# Patient Record
Sex: Male | Born: 1945
Health system: Southern US, Community
[De-identification: ages and names within clinical notes are randomized; demographics above are authoritative.]

## PROBLEM LIST (undated history)

## (undated) DIAGNOSIS — I2510 Atherosclerotic heart disease of native coronary artery without angina pectoris: Secondary | ICD-10-CM

## (undated) DIAGNOSIS — E291 Testicular hypofunction: Secondary | ICD-10-CM

## (undated) DIAGNOSIS — M869 Osteomyelitis, unspecified: Secondary | ICD-10-CM

## (undated) DIAGNOSIS — M1990 Unspecified osteoarthritis, unspecified site: Secondary | ICD-10-CM

## (undated) DIAGNOSIS — E785 Hyperlipidemia, unspecified: Secondary | ICD-10-CM

## (undated) DIAGNOSIS — I10 Essential (primary) hypertension: Secondary | ICD-10-CM

## (undated) DIAGNOSIS — Z9289 Personal history of other medical treatment: Secondary | ICD-10-CM

## (undated) HISTORY — DX: Osteomyelitis, unspecified: M86.9

## (undated) HISTORY — DX: Testicular hypofunction: E29.1

## (undated) HISTORY — DX: Atherosclerotic heart disease of native coronary artery without angina pectoris: I25.10

## (undated) HISTORY — DX: Unspecified osteoarthritis, unspecified site: M19.90

## (undated) HISTORY — DX: Essential (primary) hypertension: I10

## (undated) HISTORY — DX: Personal history of other medical treatment: Z92.89

## (undated) HISTORY — DX: Hyperlipidemia, unspecified: E78.5

---

## 2004-08-01 ENCOUNTER — Encounter: Admission: RE | Admit: 2004-08-01 | Discharge: 2004-08-01 | Payer: Self-pay | Admitting: Family Medicine

## 2004-08-19 ENCOUNTER — Encounter: Admission: RE | Admit: 2004-08-19 | Discharge: 2004-08-19 | Payer: Self-pay | Admitting: Orthopedic Surgery

## 2004-09-08 ENCOUNTER — Ambulatory Visit: Payer: Self-pay | Admitting: Infectious Diseases

## 2004-10-19 HISTORY — PX: CORONARY ARTERY BYPASS GRAFT: SHX141

## 2004-10-28 ENCOUNTER — Ambulatory Visit (HOSPITAL_COMMUNITY): Admission: RE | Admit: 2004-10-28 | Discharge: 2004-10-28 | Payer: Self-pay | Admitting: Cardiology

## 2004-11-24 ENCOUNTER — Inpatient Hospital Stay (HOSPITAL_COMMUNITY): Admission: RE | Admit: 2004-11-24 | Discharge: 2004-11-28 | Payer: Self-pay | Admitting: Surgery

## 2004-12-22 ENCOUNTER — Encounter (HOSPITAL_COMMUNITY): Admission: RE | Admit: 2004-12-22 | Discharge: 2005-01-20 | Payer: Self-pay | Admitting: Cardiology

## 2005-01-21 ENCOUNTER — Encounter (HOSPITAL_COMMUNITY): Admission: RE | Admit: 2005-01-21 | Discharge: 2005-04-21 | Payer: Self-pay | Admitting: Cardiology

## 2008-08-06 ENCOUNTER — Ambulatory Visit: Payer: Self-pay | Admitting: Internal Medicine

## 2008-08-06 DIAGNOSIS — S82409B Unspecified fracture of shaft of unspecified fibula, initial encounter for open fracture type I or II: Secondary | ICD-10-CM

## 2008-08-06 DIAGNOSIS — Z951 Presence of aortocoronary bypass graft: Secondary | ICD-10-CM

## 2008-08-06 DIAGNOSIS — S82209B Unspecified fracture of shaft of unspecified tibia, initial encounter for open fracture type I or II: Secondary | ICD-10-CM | POA: Insufficient documentation

## 2008-08-06 DIAGNOSIS — I2510 Atherosclerotic heart disease of native coronary artery without angina pectoris: Secondary | ICD-10-CM | POA: Insufficient documentation

## 2008-08-06 DIAGNOSIS — E785 Hyperlipidemia, unspecified: Secondary | ICD-10-CM | POA: Insufficient documentation

## 2008-08-06 DIAGNOSIS — Z9189 Other specified personal risk factors, not elsewhere classified: Secondary | ICD-10-CM | POA: Insufficient documentation

## 2008-08-06 DIAGNOSIS — I10 Essential (primary) hypertension: Secondary | ICD-10-CM

## 2008-08-06 DIAGNOSIS — M86669 Other chronic osteomyelitis, unspecified tibia and fibula: Secondary | ICD-10-CM

## 2008-09-17 ENCOUNTER — Encounter: Payer: Self-pay | Admitting: Internal Medicine

## 2008-09-28 ENCOUNTER — Encounter: Admission: RE | Admit: 2008-09-28 | Discharge: 2008-09-28 | Payer: Self-pay | Admitting: Orthopedic Surgery

## 2008-09-28 ENCOUNTER — Encounter: Payer: Self-pay | Admitting: Internal Medicine

## 2008-12-07 ENCOUNTER — Encounter: Payer: Self-pay | Admitting: Internal Medicine

## 2008-12-25 ENCOUNTER — Ambulatory Visit: Payer: Self-pay | Admitting: Internal Medicine

## 2008-12-28 ENCOUNTER — Telehealth: Payer: Self-pay | Admitting: Internal Medicine

## 2009-01-03 ENCOUNTER — Encounter (INDEPENDENT_AMBULATORY_CARE_PROVIDER_SITE_OTHER): Payer: Self-pay | Admitting: Interventional Radiology

## 2009-01-03 ENCOUNTER — Ambulatory Visit (HOSPITAL_COMMUNITY): Admission: RE | Admit: 2009-01-03 | Discharge: 2009-01-03 | Payer: Self-pay | Admitting: Internal Medicine

## 2009-01-18 ENCOUNTER — Ambulatory Visit (HOSPITAL_COMMUNITY): Admission: RE | Admit: 2009-01-18 | Discharge: 2009-01-18 | Payer: Self-pay | Admitting: Internal Medicine

## 2009-01-23 ENCOUNTER — Telehealth: Payer: Self-pay | Admitting: Internal Medicine

## 2009-03-28 ENCOUNTER — Ambulatory Visit: Payer: Self-pay | Admitting: Internal Medicine

## 2009-05-28 ENCOUNTER — Ambulatory Visit: Payer: Self-pay | Admitting: Internal Medicine

## 2009-10-16 ENCOUNTER — Emergency Department (HOSPITAL_COMMUNITY): Admission: EM | Admit: 2009-10-16 | Discharge: 2009-10-17 | Payer: Self-pay | Admitting: Emergency Medicine

## 2011-01-12 ENCOUNTER — Other Ambulatory Visit: Payer: Self-pay | Admitting: Gastroenterology

## 2011-01-19 LAB — BASIC METABOLIC PANEL
BUN: 13 mg/dL (ref 6–23)
CO2: 25 mEq/L (ref 19–32)
Calcium: 9.1 mg/dL (ref 8.4–10.5)
Chloride: 99 mEq/L (ref 96–112)
Creatinine, Ser: 0.92 mg/dL (ref 0.4–1.5)
GFR calc Af Amer: >60 mL/min (ref >60)
GFR calc non Af Amer: >60 mL/min (ref >60)
Glucose, Bld: 118 mg/dL — ABNORMAL HIGH (ref 70–99)
Potassium: 3.8 mEq/L (ref 3.5–5.1)
Sodium: 134 mEq/L — ABNORMAL LOW (ref 135–145)

## 2011-01-19 LAB — DIFFERENTIAL
Basophils Absolute: 0.1 10*3/uL (ref 0.0–0.1)
Basophils Relative: 0 % (ref 0–1)
Eosinophils Absolute: 0.1 10*3/uL (ref 0.0–0.7)
Eosinophils Relative: 1 % (ref 0–5)
Lymphocytes Relative: 6 % — ABNORMAL LOW (ref 12–46)
Lymphs Abs: 1.1 10*3/uL (ref 0.7–4.0)
Monocytes Absolute: 0.5 10*3/uL (ref 0.1–1.0)
Monocytes Relative: 3 % (ref 3–12)
Neutro Abs: 17.1 10*3/uL — ABNORMAL HIGH (ref 1.7–7.7)
Neutrophils Relative %: 90 % — ABNORMAL HIGH (ref 43–77)

## 2011-01-19 LAB — URINALYSIS, ROUTINE W REFLEX MICROSCOPIC
Bilirubin Urine: NEGATIVE
Glucose, UA: NEGATIVE mg/dL
Hgb urine dipstick: NEGATIVE
Ketones, ur: 15 mg/dL — AB
Nitrite: NEGATIVE
Protein, ur: NEGATIVE mg/dL
Specific Gravity, Urine: 1.017 (ref 1.005–1.030)
Urobilinogen, UA: 0.2 mg/dL (ref 0.0–1.0)
pH: 5.5 (ref 5.0–8.0)

## 2011-01-19 LAB — CBC
HCT: 44 % (ref 39.0–52.0)
Hemoglobin: 15.2 g/dL (ref 13.0–17.0)
MCHC: 34.5 g/dL (ref 30.0–36.0)
MCV: 94.7 fL (ref 78.0–100.0)
Platelets: 220 10*3/uL (ref 150–400)
RBC: 4.65 MIL/uL (ref 4.22–5.81)
RDW: 13.1 % (ref 11.5–15.5)
WBC: 19 10*3/uL — ABNORMAL HIGH (ref 4.0–10.5)

## 2011-01-19 LAB — POCT CARDIAC MARKERS
CKMB, poc: 1.6 ng/mL (ref 1.0–8.0)
Myoglobin, poc: 115 ng/mL (ref 12–200)
Troponin i, poc: <0.05 ng/mL (ref 0.00–0.09)

## 2011-01-19 LAB — PROTIME-INR
INR: 0.98 (ref 0.00–1.49)
Prothrombin Time: 12.9 seconds (ref 11.6–15.2)

## 2011-01-19 LAB — APTT: aPTT: 29 seconds (ref 24–37)

## 2011-01-28 LAB — AFB CULTURE WITH SMEAR (NOT AT ARMC): Acid Fast Smear: NONE SEEN

## 2011-01-28 LAB — ANAEROBIC CULTURE

## 2011-01-28 LAB — CULTURE, ROUTINE-ABSCESS: Culture: NO GROWTH

## 2011-01-29 LAB — CBC
HCT: 43.4 % (ref 39.0–52.0)
Hemoglobin: 15.4 g/dL (ref 13.0–17.0)
MCHC: 35.6 g/dL (ref 30.0–36.0)
MCV: 93.6 fL (ref 78.0–100.0)
Platelets: 251 10*3/uL (ref 150–400)
RBC: 4.63 MIL/uL (ref 4.22–5.81)
RDW: 12.6 % (ref 11.5–15.5)
WBC: 8.7 10*3/uL (ref 4.0–10.5)

## 2011-01-29 LAB — PROTIME-INR
INR: 0.9 (ref 0.00–1.49)
Prothrombin Time: 12.7 seconds (ref 11.6–15.2)

## 2011-03-06 NOTE — Op Note (Signed)
NAMEMarland Kitchen  Craig Knapp, Craig Knapp              ACCOUNT NO.:  [id number]   MEDICAL RECORD NO.:  [id number]          PATIENT TYPE:  INP   LOCATION:  2307                         FACILITY:  [facility]   PHYSICIAN:  Evelene Croon, M.D.     DATE OF BIRTH:  04-04-1946   DATE OF PROCEDURE:  11/24/2004  DATE OF DISCHARGE:                                 OPERATIVE REPORT   PREOPERATIVE DIAGNOSIS:  Severe three vessel coronary artery disease with  unstable angina.   POSTOPERATIVE DIAGNOSIS:  Severe three vessel coronary artery disease with  unstable angina.   PROCEDURE:  Median sternotomy, extracorporeal circulation, coronary artery  bypass graft surgery x6 using a left internal mammary artery graft to the  left anterior descending coronary artery, with a saphenous vein graft to the  second diagonal branch of the LAD, a sequential saphenous vein graft to the  first diagonal, intermediate, and obtuse marginal coronary arteries, and a  saphenous vein graft to the right coronary artery.  Endoscopic vein  harvesting from the left leg.   SURGEON:  Evelene Croon, M.D.   ASSISTANT:  Kerin Perna, M.D.   SECOND ASSISTANT:  Toribio Harbour, N.P.   ANESTHESIA:  General endotracheal.   INDICATIONS FOR PROCEDURE:  This patient is a 65 year old gentleman with a  family history of coronary artery disease, a history of hyperlipidemia,  hypertension, and smoking, who presented with a six-month history of  increasing dyspnea on exertion and diaphoresis followed by chest tightness.  A Cardiolite scan on October 14, 2004, showed moderate to severe perfusion  defect in the inferior and inferolateral walls with some reversibility.  Left ventricular ejection fraction was 65%.  He subsequently underwent  cardiac catheterization.  The LAD had a long proximal 75 to 80% stenosis.  There was a large bifurcating diagonal that had about 75% stenosis at its  origin as well as in the more inferior subbranch.  There was a  moderate size  intermediate branch that bifurcated proximally.  Both subbranches appeared  to be involved with about 80% stenosis.  The left circumflex artery itself  had a couple of small left ventricular branches which did not appear large  enough to graft.  There was no significant obstruction in the left  circumflex itself.  The right coronary artery was a nondominant, but large  artery.  There was a single large right ventricular branch that had about  85% focal stenosis.  There was no gradient across the aortic valve and no  mitral regurgitation.  Ejection fraction was 65 to 70% with akinesis of the  inferior wall.  After review of the angiogram and examination of the  patient, it was felt that coronary artery bypass graft surgery was the best  treatment.  I discussed the operative procedure with the patient and his  family including alternatives, benefits, and risks including bleeding, blood  transfusion, infection, stroke, myocardial infarction, graft failure, and  death.  They understood and agreed to proceed.   DESCRIPTION OF PROCEDURE:  The patient was taken to the operating room and  placed on the table in the supine position.  After induction of general  endotracheal anesthesia, a Foley catheter was placed in the bladder using  sterile technique.  Then the chest, abdomen, and both lower extremities were  prepped and draped in the usual sterile fashion.  The right lower leg was  draped out of the operative field due to a history of osteomyelitis with an  open draining wound there.   The chest was entered through a median sternotomy incision and the  pericardium opened in the midline.  Examination of the heart showed good  ventricular contractility.  The ascending aorta had no palpable plaques in  it.   Then the left internal mammary artery was harvested from the chest wall as a  pedicle graft.  This was a large caliber vessel with excellent blood flow  through it.  At the  same time a segment of greater saphenous vein was  harvested from the left leg using endoscopic vein harvest technique.  This  vein was of medium size and good quality.   Then the patient was heparinized and when an adequate activated clotting  time was achieved, the distal ascending aorta was cannulated using a 22  French aortic cannula for arterial inflow.  Venous outflow was achieved  using a two-stage venous cannula through the right atrial appendage.  An  antegrade cardioplegia and vent cannula was inserted into the aortic root.   The patient was placed on cardiopulmonary bypass and the distal coronary  artery was identified.  The LAD was a large graftable vessel with no  significant distal disease in it.  The diagonal branch bifurcated into two  subbranches that I designated first and second diagonal branches.  The first  was more lateral and was a small, but graftable vessel.  The second was a  medium size graftable vessel.  The intermediate vessel was small, but  graftable.  The larger subbranch of the intermediate or obtuse marginal  artery was a large graftable vessel.  The distal left circumflex was never  visualized and there were several very tiny posterolateral branches that  were not graftable.  The right coronary artery gave off a single acute  marginal branch that was a moderate size vessel.   Then the aorta was crossclamped and 500 mL of cold blood antegrade  cardioplegia was administered into the aortic root with quick arrest of the  heart.  Systemic hypothermia to 28 degrees cGy and topical hypothermia with  iced saline was used.  A temperature probe was placed in the septum and  insulating pad in the pericardium.   The first distal anastomosis was performed to the intermediate coronary  artery. The internal diameter was about 1.5 mm.  The conduit used was a  segment of greater saphenous vein and the anastomosis performed in a sequential side-to-side manner using  continuous 8-0 Prolene suture.  Flow  was measured through the graft and was excellent.   The second distal anastomosis was performed to the first diagonal branch.  The internal diameter of this vessel was about 1.5 mm.  The conduit used was  the same segment of greater saphenous vein and the anastomosis performed in  a sequential side-to-side manner using continuous 8-0 Prolene suture.  Flow  was measured through the graft and was excellent.   A third distal anastomosis was performed to the obtuse marginal branch.  The  internal diameter of this vessel was about 2 mm.  The conduit used was the  same segment of greater saphenous vein and the anastomosis  performed in a  sequential end-to-side manner using continuous 7-0 Prolene suture.  Flow was  measured through the graft and was excellent.  Then a dose of cardioplegia  was given down the vein graft and into the aortic root.   The fourth distal anastomosis was performed to the second diagonal branch.  The internal diameter was about 1.6 mm.  The conduit used was a second  segment of greater saphenous vein.  The anastomosis performed in an end-to-  side manner using continuous 7-0 Prolene suture.  Flow was measured through  the graft and was excellent.   The fifth distal anastomosis was performed to the acute marginal branch.  The internal diameter was 1.6 mm.  The conduit used was a third segment of  greater saphenous vein.  The anastomosis performed in an end-to-side manner  using continuous 7-0 Prolene suture.  Flow was measured through the graft  and was excellent.  Then another dose of cardioplegia was given down the  vein grafts and into the aortic root.   The sixth distal anastomosis was performed to the midportion of the left  anterior descending coronary artery.  The internal diameter of this vessel  was about 2.5 mm.  The conduit used was the left internal mammary artery  graft and this was brought out through an opening in  the left pericardium  anterior to the phrenic nerve.  It was anastomosed to the LAD in an end-to-  side manner using continuous 8-0 Prolene suture.  The pedicle was sutured to  the epicardium using 6-0 Prolene sutures.  Then another dose of cardioplegia  was given.   Then with the crossclamp in place, the three proximal vein graft anastomoses  were performed to the aortic root in an end-to-side manner using continuous  6-0 Prolene suture.  The patient was rewarmed to 37 degrees cGy.  The clamp  was removed from the mammary artery pedicle.  There was rapid warming of the  ventricular septum and return of spontaneous ventricular fibrillation.  The  crossclamp was removed with a time of 88 minutes and the patient  spontaneously converted to sinus rhythm.   The proximal and distal anastomoses appeared hemostatic and the line of the  grafts satisfactory.  Graft markers were placed around the proximal anastomoses.  Two temporary right ventricular and right atrial pacing wires  were placed and brought out through the skin.   When the patient had rewarmed to 37 degrees cGy, he was weaned from  cardiopulmonary bypass on no inotropic agents.  Total bypass time was 106  minutes.  Cardiac function appeared excellent and the cardiac output of 6  liters per minute.  Protamine was given and the venous and aortic cannuli  were removed without difficulty.  Hemostasis was achieved.  Three chest  tubes were placed with two in the posterior pericardium, one in the left  pleural space, and one in the anterior mediastinum.  The pericardium was  loosely reapproximated over the heart.  The sternum was closed with #6  stainless steel wires.  Fascia was closed with a continuous #1 Vicryl  suture.  Subcutaneous tissue was closed with continuous 2-0 Vicryl and the  skin with 3-0 Vicryl subcuticular closure.  The lower extremity vein harvest  site was closed in layers in a similar manner.  Needle, sponge, and   instrument counts correct according to the scrub nurse.  Dry sterile  dressings were applied over the incisions, around the chest tubes which were  hooked  to Pleurovac suction.  The patient remained hemodynamically stable  and was transported to the SICU in guarded, but stable condition.      BB/MEDQ  D:  11/24/2004  T:  11/24/2004  Job:  [id number]   cc:   Francisca December, M.D.  [address]  [address]  [place]  [place] [address]  Fax: [phone]

## 2011-03-06 NOTE — Cardiovascular Report (Signed)
NAME:  Craig Knapp, Craig Knapp              ACCOUNT NO.:  [id number]   MEDICAL RECORD NO.:  [id number]          PATIENT TYPE:  OIB   LOCATION:  2852                         FACILITY:  [facility]   PHYSICIAN:  Francisca December, M.D.  DATE OF BIRTH:  [birth year]   DATE OF PROCEDURE:  DATE OF DISCHARGE:                              CARDIAC CATHETERIZATION   PROCEDURE:  1.  Left heart catheterization.  2.  Coronary angiography.  3.  Left ventriculogram.   CARDIOLOGIST:  Francisca December, M.D.   INDICATIONS FOR PROCEDURE:  The patient is a 65 year old man who presented  to Dr. Jethro Bastos and subsequently to myself with a fairly typical  history for angina pectoris in an NYHA class 2 pattern.  He underwent an  exercise myocardial perfusion study at my office, which showed a moderate to  severe perfusion defect in the inferior and inferolateral wall, which  revealed mild defect reversibility on rest imaging.  The ejection fraction  was 65%.  He was able to exercise only five METS, and angina was reproduced.  He is brought to the cardiac catheterization laboratory at this time to  identify the extent of disease and to provide for further therapeutic  options.   DESCRIPTION OF PROCEDURE:  The patient was brought to the cardiac  catheterization laboratory in the fasting state.  The right groin was  prepped and draped in the usual sterile fashion.  Local anesthesia was  obtained with the infiltration of 1% Lidocaine.  A 5-French catheter sheath  was inserted percutaneously into the right femoral artery, utilizing an  anterior approach over a guiding J-wire.  A 110 cm pigtail catheter was used  to measure pressures in the ascending aorta and in the left ventricle both  prior to, and following the ventriculogram.  A 30-degree RAO cine left  ventriculogram was performed utilizing a power injector, and 40 mL of  contrast material was injected at 13 mL per second.  Following the  sublingual  administration of 0.4 mg of nitroglycerin, a cine angiography of  each coronary artery was conducted in multiple LAO and RAO projections.  At  the completion of the procedure, the catheter and catheter sheaths were  removed.  Hemostasis was achieved by direct pressure.   The patient was transported to the recovery area in stable condition with an  intact distal pulse.   HEMODYNAMICS:  Systemic arterial pressure:  130/75, with a mean of 100 mmHg.  There was no systolic gradient across the aortic valve.  Left ventricular end diastolic pressure:  Was 13 mmHg pre-ventriculogram and  post-ventriculogram.   ANGIOGRAPHY:  The left ventriculogram demonstrated normal chamber size and  normal global systolic function with a visually-estimated ejection fraction  of 65%-70%.  There is focal akinesis of the diaphragmatic inferior wall.  The aortic valve was tri-leaflet and opens normally.  There is no  significant mitral regurgitation.  There is left coronary artery  calcification seen.   RESULTS:  1.  MAIN LEFT CORONARY ARTERY:  There is a left dominant coronary system      present.  The main left  coronary artery demonstrates a 30% eccentric      narrowing over approximately two-thirds the length of the vessel.  The      vessel then trifurcates into the anterior descending coronary artery.  A      large ramus intermedius branch, which immediately bifurcates itself, and      the left dominant circumflex.   1.  LEFT ANTERIOR DESCENDING CORONARY ARTERY:  The left anterior descending      coronary artery and its branches are highly diseased.  The vessel is      diffusely diseased throughout the proximal and mid-segment, to an extent      of about 75%-80%.  There is a large bifurcating diagonal branch, which      has a 75% stenosis at its origin, and is highly angulated.  The ongoing      anterior descending coronary artery reaches and traverses the apex, but      displays no significant  obstructions.   1.  RAMUS INTERMEDIUS BRANCH:  The ramus intermedius branch is large, and as      mentions bifurcates within 1 cm of its origin.  The more lateral sub-      branches the dominant vessel and is the vessel that perfuses the area      with wall motion abnormality.  The more medial sub-branch is relatively      small in caliber.   1.  LEFT CIRCUMFLEX CORONARY ARTERY:  The left circumflex coronary artery      demonstrates no significant obstructions that I can determine.  It gives      rise to two small left ventricular branches and a small to moderate-size      posterolateral segment.  There is a small posterior descending artery      which does not leave the more proximal one-third of the inferior      ventricular septum.  None of these vessels are adequate for graft      insertion.   1.  RIGHT CORONARY ARTERY:  The right coronary artery is non-dominant but      relatively large.  It amounts to a right ventricular branch, but does      give some distal septal perforators into the inferior septum near the      apex.  In the mid-portion of this vessel, there is a focal 85% stenosis.      There are no collateral vessels seen.   FINAL IMPRESSION:  1.  Atherosclerotic coronary vascular disease, three-vessel equivalent.  2.  Intact global left ventricular size and systolic function, with focal      regional wall motion abnormality as noted.  3.  New York Heart Association class 2 angina pectoris.   PLAN/RECOMMENDATIONS:  I am referring the patient to the cardiovascular and  thoracic surgeons for consideration of coronary artery bypass graft surgery.  The distal vessels are all adequate in size, including the right ventricular  branch for bypass; however, this may be complicated by the recurrence of an  old injury-related osteomyelitis in the distal right tibia-fibula region.  The patient has seen an infectious disease specialist recently, and is scheduled to see an orthopedic  surgeon in the near future.  He is not  currently being treated with any antibiotics.   Intervention from a percutaneous standpoint would be quite difficulty, given  the ostial nature of the large ramus intermedius lesion.  The anterior  descending artery could be treated with extensive stenting to the  mid and  proximal portion.      John   JHE/MEDQ  D:  10/28/2004  T:  10/28/2004  Job:  [id number]   cc:   Jethro Bastos, M.D.  [address]  [place]  [place] [address]  Fax: [phone]

## 2011-03-06 NOTE — Discharge Summary (Signed)
NAME:  Craig Knapp, Craig Knapp              ACCOUNT NO.:  [id number]   MEDICAL RECORD NO.:  [id number]          PATIENT TYPE:  INP   LOCATION:  2014                         FACILITY:  [facility]   PHYSICIAN:  Evelene Croon, M.D.     DATE OF BIRTH:  [birth year]   DATE OF ADMISSION:  11/24/2004  DATE OF DISCHARGE:  11/28/2004                                 DISCHARGE SUMMARY   ADMITTING DIAGNOSES:  Severe three vessel coronary artery disease.   PAST MEDICAL HISTORY:  1.  Hypertension.  2.  Hyperlipidemia.  3.  Ongoing tobacco use.  4.  Chronic osteomyelitis of right lower tibia, long-standing problem      greater than 20 years.   DISCHARGE DIAGNOSES:  Severe three vessel coronary artery disease status  post coronary artery bypass graft.   BRIEF HISTORY:  Craig Knapp is a 65 year old Caucasian man.  He was evaluated  by Dr. Corliss Marcus and this was performed on October 28, 2004.  Cardiac  catheterization revealed severe three vessel coronary artery disease.  As  his lesions were not amenable to PCI, cardiac surgery consultation was  requested.  Craig Knapp was evaluated by Dr. Evelene Croon at the [facility] office  on November 11, 2004.  After examination of the patient and review of all  available records Dr. Laneta Simmers agreed that proceeding with coronary artery  bypass grafting was the preferred treatment choice for this gentleman.  The  procedure, risks, and benefits of coronary artery bypass grafting were all  discussed with Craig Knapp and he agreed to proceed with surgery.   HOSPITAL COURSE:  On November 24, 2004 Craig Knapp was electively admitted to  [facility] under the care of Dr. Evelene Croon.  He underwent the  following surgical procedure:  Coronary artery bypass grafting x6.  Grafts  placed at time of procedure:  Left internal mammary artery graft to the left  anterior descending artery, saphenous vein graft to the second diagonal  artery, saphenous vein graft in the sequential fashion  to the first  diagonal, first obtuse marginal, and second obtuse marginal arteries,  saphenous vein graft to the distal right coronary artery.  Vein was  harvested from Craig Knapp left thigh through the mid calf with an Endo  vein harvesting technique.  He tolerated this procedure well transferring in  stable condition to the SICU.  He remained hemodynamically stable in the  immediate postoperative period.  He was extubated several hours after  arrival in intensive care unit and awoke from anesthesia neurologically  intact.  He required only routine care in the intensive care unit and was  ready for transfer to unit 2000 on postoperative day one.   Craig Knapp is making very good progress in recovering from his surgery.  This morning, February 9 is postoperative day three.  On morning rounds Mr.  Knapp reports feeling very well.  His vital signs are stable with blood  pressure 137/81.  He is afebrile.  Room air saturation is 94%.  His heart is  maintaining a normal sinus rhythm, 93 beats per minute.  His lungs are  clear  to auscultation.  He is tolerating his diet.  Bowel and bladder habits  within normal limits for him.  His incisions are healing well.  He has no  lower extremity edema.  He is ambulating independently.  His pain is well  controlled.  His weight is still slightly above his preoperative weight and  he will continue his diuretic medication for the next 24-48 hours.  If Mr.  Knapp remains stable it is anticipated he will be ready for discharge home  tomorrow, November 28, 2004.   LABORATORIES:  February 8 CBC:  White blood cells 12.5, hemoglobin 10.8,  hematocrit 31.0, platelets 213.  Chemistries include sodium 136, potassium  3.7, BUN 6, creatinine 0.9, glucose 118.  Please note that BMP will be  repeated morning of February 10.   CONDITION ON DISCHARGE:  Improved.   DISCHARGE INSTRUCTIONS:   MEDICATIONS:  1.  Enteric-coated aspirin 325 mg daily.  2.  Lopressor 25  mg b.i.d.  3.  Vytorin 10/20 mg daily.   PAIN MANAGEMENT:  For pain management he may have Tylox one to two p.o.  q.4h. p.r.n. for severe pain or Tylenol 325 mg one to two p.o. q.4h. for  mild pain.   ACTIVITY:  He has been asked to refrain from any driving or heavy lifting,  pushing, or pulling.  Also instructed to continue his breathing exercises  and daily walking.  He has been counseled regarding smoking cessation.  He  reports that he plans to stay stopped.  He is also asked about chewing  tobacco and has been advised not to take up this habit.   DIET:  Continue to be low fat, low salt diet.   WOUND CARE:  He is to shower daily with mild soap and water.  If his  incisions show any sign of infection he is to call Dr. Sharee Pimple office.   FOLLOW-UP:  Dr. Amil Amen is to see him back in his office in approximately  two weeks and patient has been asked to call to arrange this appointment and  given the office phone number.  He will have a chest x-ray taken that day.  Dr. Laneta Simmers would like to see him back at the [facility] office on Tuesday,  February 28 at 11:45 a.m.  He will be asked to bring his chest x-ray from  Dr. Amil Amen' office for Dr. Laneta Simmers to review.      CTK/MEDQ  D:  11/27/2004  T:  11/27/2004  Job:  [id number]   cc:   Vania Rea. Supple, M.D.  [facility]  [address]  [address]  [place]  [place] [address]  Fax: [phone]   Jethro Bastos, M.D.  [address]  [place]  [place] [address]  Fax: [phone]

## 2011-03-27 DIAGNOSIS — E291 Testicular hypofunction: Secondary | ICD-10-CM | POA: Insufficient documentation

## 2011-04-13 DIAGNOSIS — E291 Testicular hypofunction: Secondary | ICD-10-CM

## 2011-04-14 ENCOUNTER — Ambulatory Visit (INDEPENDENT_AMBULATORY_CARE_PROVIDER_SITE_OTHER): Payer: Self-pay | Admitting: Internal Medicine

## 2011-04-14 ENCOUNTER — Encounter: Payer: Self-pay | Admitting: Internal Medicine

## 2011-04-14 VITALS — BP 150/102 | HR 69 | Temp 98.0°F | Ht 69.0 in | Wt 234.5 lb

## 2011-04-14 DIAGNOSIS — M86669 Other chronic osteomyelitis, unspecified tibia and fibula: Secondary | ICD-10-CM
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
## 2011-04-14 NOTE — Assessment & Plan Note (Addendum)
He is aware that his very chronic osteomyelitis would be extremely difficult to cure her without amputation. While he is willing to consider that surgical procedure he is not ready to make that decision yet. I suggested that he reevaluate his goal and make sure that it is a reasonable one before deciding about what he might do to achieve that goal. I certainly think it would be reasonable for him to get yet another opinion at [facility]. I will try to facilitate that. I think the next step beyond that might be to repeat an aspirate and try to define a specific organism causing the osteomyelitis. Once that has been established it would be easier to determine if there might be a simple and relatively safe antibiotic regimen that he could take in an attempt to chronically suppress the infection. Having a definitive organism would also be extremely important if he ever makes a decision to try to have surgery to achieve limb salvage and attempt to cure the osteomyelitis.

## 2011-05-15 ENCOUNTER — Telehealth: Payer: Self-pay | Admitting: *Deleted

## 2011-05-15 NOTE — Telephone Encounter (Signed)
Pt confirmed that he has an appt @ [facility] for May 28, 2011.  He stated that records from that visit will be shared w/ Dr. Orvan Falconer.  Jennet Maduro, RN.

## 2012-09-26 DIAGNOSIS — E782 Mixed hyperlipidemia: Secondary | ICD-10-CM | POA: Diagnosis not present

## 2012-10-28 DIAGNOSIS — Z0000 Encounter for general adult medical examination without abnormal findings: Secondary | ICD-10-CM | POA: Diagnosis not present

## 2012-10-28 DIAGNOSIS — I10 Essential (primary) hypertension: Secondary | ICD-10-CM | POA: Diagnosis not present

## 2012-10-28 DIAGNOSIS — Z23 Encounter for immunization: Secondary | ICD-10-CM | POA: Diagnosis not present

## 2012-10-28 DIAGNOSIS — Z125 Encounter for screening for malignant neoplasm of prostate: Secondary | ICD-10-CM | POA: Diagnosis not present

## 2012-10-28 DIAGNOSIS — Z1331 Encounter for screening for depression: Secondary | ICD-10-CM | POA: Diagnosis not present

## 2012-12-22 DIAGNOSIS — I10 Essential (primary) hypertension: Secondary | ICD-10-CM | POA: Diagnosis not present

## 2012-12-22 DIAGNOSIS — Z0000 Encounter for general adult medical examination without abnormal findings: Secondary | ICD-10-CM | POA: Diagnosis not present

## 2012-12-22 DIAGNOSIS — Z1331 Encounter for screening for depression: Secondary | ICD-10-CM | POA: Diagnosis not present

## 2012-12-22 DIAGNOSIS — E785 Hyperlipidemia, unspecified: Secondary | ICD-10-CM | POA: Diagnosis not present

## 2012-12-22 DIAGNOSIS — Z79899 Other long term (current) drug therapy: Secondary | ICD-10-CM | POA: Diagnosis not present

## 2013-06-26 DIAGNOSIS — E785 Hyperlipidemia, unspecified: Secondary | ICD-10-CM | POA: Diagnosis not present

## 2013-06-26 DIAGNOSIS — Z79899 Other long term (current) drug therapy: Secondary | ICD-10-CM | POA: Diagnosis not present

## 2013-06-28 DIAGNOSIS — I10 Essential (primary) hypertension: Secondary | ICD-10-CM | POA: Diagnosis not present

## 2013-06-28 DIAGNOSIS — E785 Hyperlipidemia, unspecified: Secondary | ICD-10-CM | POA: Diagnosis not present

## 2013-06-28 DIAGNOSIS — I2510 Atherosclerotic heart disease of native coronary artery without angina pectoris: Secondary | ICD-10-CM | POA: Diagnosis not present

## 2013-06-28 DIAGNOSIS — R0602 Shortness of breath: Secondary | ICD-10-CM | POA: Diagnosis not present

## 2013-07-04 ENCOUNTER — Other Ambulatory Visit: Payer: Self-pay | Admitting: Interventional Cardiology

## 2013-07-04 DIAGNOSIS — Z79899 Other long term (current) drug therapy: Secondary | ICD-10-CM

## 2013-07-04 DIAGNOSIS — E785 Hyperlipidemia, unspecified: Secondary | ICD-10-CM

## 2013-07-08 DIAGNOSIS — Z23 Encounter for immunization: Secondary | ICD-10-CM | POA: Diagnosis not present

## 2013-07-17 DIAGNOSIS — L989 Disorder of the skin and subcutaneous tissue, unspecified: Secondary | ICD-10-CM | POA: Diagnosis not present

## 2013-07-28 DIAGNOSIS — H40019 Open angle with borderline findings, low risk, unspecified eye: Secondary | ICD-10-CM | POA: Diagnosis not present

## 2013-08-02 DIAGNOSIS — D485 Neoplasm of uncertain behavior of skin: Secondary | ICD-10-CM | POA: Diagnosis not present

## 2013-08-02 DIAGNOSIS — C44320 Squamous cell carcinoma of skin of unspecified parts of face: Secondary | ICD-10-CM | POA: Diagnosis not present

## 2013-08-18 DIAGNOSIS — H40019 Open angle with borderline findings, low risk, unspecified eye: Secondary | ICD-10-CM | POA: Diagnosis not present

## 2013-09-29 DIAGNOSIS — J069 Acute upper respiratory infection, unspecified: Secondary | ICD-10-CM | POA: Diagnosis not present

## 2013-10-07 ENCOUNTER — Other Ambulatory Visit: Payer: Self-pay | Admitting: Interventional Cardiology

## 2013-10-24 DIAGNOSIS — Z85828 Personal history of other malignant neoplasm of skin: Secondary | ICD-10-CM | POA: Diagnosis not present

## 2013-12-25 ENCOUNTER — Other Ambulatory Visit: Payer: Self-pay

## 2013-12-26 ENCOUNTER — Other Ambulatory Visit (INDEPENDENT_AMBULATORY_CARE_PROVIDER_SITE_OTHER): Payer: Medicare Other

## 2013-12-26 DIAGNOSIS — E785 Hyperlipidemia, unspecified: Secondary | ICD-10-CM | POA: Diagnosis not present

## 2013-12-26 DIAGNOSIS — Z79899 Other long term (current) drug therapy: Secondary | ICD-10-CM

## 2013-12-26 LAB — ALT: ALT: 37 U/L (ref 0–53)

## 2013-12-26 LAB — LIPID PANEL
Cholesterol: 147 mg/dL (ref 0–200)
HDL: 71.4 mg/dL
LDL Cholesterol: 50 mg/dL (ref 0–99)
Total CHOL/HDL Ratio: 2
Triglycerides: 126 mg/dL (ref 0.0–149.0)
VLDL: 25.2 mg/dL (ref 0.0–40.0)

## 2014-01-01 ENCOUNTER — Telehealth: Payer: Self-pay | Admitting: Interventional Cardiology

## 2014-01-01 ENCOUNTER — Encounter: Payer: Self-pay | Admitting: Cardiology

## 2014-01-01 NOTE — Telephone Encounter (Signed)
Called pt back and went over results with pt. I told pt once Dr. Irish Lack reviews labs I will give him another call back.

## 2014-01-01 NOTE — Telephone Encounter (Signed)
Follow up   Test results

## 2014-01-01 NOTE — Telephone Encounter (Signed)
New message  Patient would like results of blood work, please call back.

## 2014-01-09 ENCOUNTER — Other Ambulatory Visit: Payer: Self-pay | Admitting: Cardiology

## 2014-01-09 DIAGNOSIS — E785 Hyperlipidemia, unspecified: Secondary | ICD-10-CM

## 2014-01-09 MED ORDER — ATORVASTATIN CALCIUM 80 MG PO TABS: 80.0000 mg | ORAL_TABLET | Freq: Every day | ORAL | Status: DC

## 2014-01-23 DIAGNOSIS — D485 Neoplasm of uncertain behavior of skin: Secondary | ICD-10-CM | POA: Diagnosis not present

## 2014-01-23 DIAGNOSIS — Z85828 Personal history of other malignant neoplasm of skin: Secondary | ICD-10-CM | POA: Diagnosis not present

## 2014-01-23 DIAGNOSIS — L570 Actinic keratosis: Secondary | ICD-10-CM | POA: Diagnosis not present

## 2014-01-25 DIAGNOSIS — M869 Osteomyelitis, unspecified: Secondary | ICD-10-CM | POA: Diagnosis not present

## 2014-02-16 ENCOUNTER — Other Ambulatory Visit: Payer: Self-pay | Admitting: Internal Medicine

## 2014-02-16 DIAGNOSIS — M869 Osteomyelitis, unspecified: Secondary | ICD-10-CM

## 2014-02-16 DIAGNOSIS — M86669 Other chronic osteomyelitis, unspecified tibia and fibula: Secondary | ICD-10-CM | POA: Diagnosis not present

## 2014-02-19 ENCOUNTER — Ambulatory Visit
Admission: RE | Admit: 2014-02-19 | Discharge: 2014-02-19 | Disposition: A | Discharge status: Home / Self Care | Payer: Medicare Other | Source: Ambulatory Visit | Attending: Internal Medicine | Admitting: Internal Medicine

## 2014-02-19 DIAGNOSIS — M869 Osteomyelitis, unspecified: Secondary | ICD-10-CM

## 2014-02-19 DIAGNOSIS — M86669 Other chronic osteomyelitis, unspecified tibia and fibula: Secondary | ICD-10-CM | POA: Diagnosis not present

## 2014-02-19 MED ORDER — GADOBENATE DIMEGLUMINE 529 MG/ML IV SOLN
20.0000 mL | Freq: Once | INTRAVENOUS | Status: AC | PRN
  Administered 2014-02-19: 20 mL via INTRAVENOUS

## 2014-03-01 DIAGNOSIS — M86669 Other chronic osteomyelitis, unspecified tibia and fibula: Secondary | ICD-10-CM | POA: Diagnosis not present

## 2014-03-01 DIAGNOSIS — L97909 Non-pressure chronic ulcer of unspecified part of unspecified lower leg with unspecified severity: Secondary | ICD-10-CM | POA: Diagnosis not present

## 2014-03-05 DIAGNOSIS — M86669 Other chronic osteomyelitis, unspecified tibia and fibula: Secondary | ICD-10-CM | POA: Diagnosis not present

## 2014-03-08 DIAGNOSIS — L97909 Non-pressure chronic ulcer of unspecified part of unspecified lower leg with unspecified severity: Secondary | ICD-10-CM | POA: Diagnosis not present

## 2014-03-08 DIAGNOSIS — M86669 Other chronic osteomyelitis, unspecified tibia and fibula: Secondary | ICD-10-CM | POA: Diagnosis not present

## 2014-03-08 DIAGNOSIS — I872 Venous insufficiency (chronic) (peripheral): Secondary | ICD-10-CM | POA: Diagnosis not present

## 2014-03-27 DIAGNOSIS — M86669 Other chronic osteomyelitis, unspecified tibia and fibula: Secondary | ICD-10-CM | POA: Diagnosis not present

## 2014-04-03 DIAGNOSIS — Z23 Encounter for immunization: Secondary | ICD-10-CM | POA: Diagnosis not present

## 2014-04-03 DIAGNOSIS — I10 Essential (primary) hypertension: Secondary | ICD-10-CM | POA: Diagnosis not present

## 2014-04-03 DIAGNOSIS — Z0000 Encounter for general adult medical examination without abnormal findings: Secondary | ICD-10-CM | POA: Diagnosis not present

## 2014-04-03 DIAGNOSIS — Z1331 Encounter for screening for depression: Secondary | ICD-10-CM | POA: Diagnosis not present

## 2014-04-05 DIAGNOSIS — M869 Osteomyelitis, unspecified: Secondary | ICD-10-CM | POA: Diagnosis not present

## 2014-04-05 DIAGNOSIS — L97809 Non-pressure chronic ulcer of other part of unspecified lower leg with unspecified severity: Secondary | ICD-10-CM | POA: Diagnosis not present

## 2014-04-05 DIAGNOSIS — M86669 Other chronic osteomyelitis, unspecified tibia and fibula: Secondary | ICD-10-CM | POA: Diagnosis not present

## 2014-04-05 DIAGNOSIS — L97909 Non-pressure chronic ulcer of unspecified part of unspecified lower leg with unspecified severity: Secondary | ICD-10-CM | POA: Diagnosis not present

## 2014-04-17 DIAGNOSIS — M86669 Other chronic osteomyelitis, unspecified tibia and fibula: Secondary | ICD-10-CM | POA: Diagnosis not present

## 2014-06-12 ENCOUNTER — Other Ambulatory Visit: Payer: Self-pay | Admitting: Interventional Cardiology

## 2014-07-03 ENCOUNTER — Encounter: Payer: Self-pay | Admitting: Cardiology

## 2014-07-03 ENCOUNTER — Ambulatory Visit (INDEPENDENT_AMBULATORY_CARE_PROVIDER_SITE_OTHER): Payer: Medicare Other | Admitting: Interventional Cardiology

## 2014-07-03 ENCOUNTER — Encounter: Payer: Self-pay | Admitting: Interventional Cardiology

## 2014-07-03 VITALS — BP 138/80 | HR 56 | Ht 68.5 in | Wt 226.0 lb

## 2014-07-03 DIAGNOSIS — I2510 Atherosclerotic heart disease of native coronary artery without angina pectoris: Secondary | ICD-10-CM

## 2014-07-03 DIAGNOSIS — I10 Essential (primary) hypertension: Secondary | ICD-10-CM | POA: Diagnosis not present

## 2014-07-03 DIAGNOSIS — E669 Obesity, unspecified: Secondary | ICD-10-CM

## 2014-07-03 DIAGNOSIS — Z951 Presence of aortocoronary bypass graft: Secondary | ICD-10-CM

## 2014-07-03 DIAGNOSIS — Z79899 Other long term (current) drug therapy: Secondary | ICD-10-CM | POA: Diagnosis not present

## 2014-07-03 DIAGNOSIS — E785 Hyperlipidemia, unspecified: Secondary | ICD-10-CM

## 2014-07-03 MED ORDER — LISINOPRIL-HYDROCHLOROTHIAZIDE 20-12.5 MG PO TABS: 2.0000 | ORAL_TABLET | Freq: Every day | ORAL | Status: DC

## 2014-07-03 NOTE — Progress Notes (Signed)
Patient ID: Craig Knapp, male   DOB: [birth year], 68 y.o.   MRN: [id number]    [place], [address], [place]  [address] Phone: [phone] Fax:  [phone]  Date:  07/03/2014   ID:  Craig Knapp, DOB August 13, 1946, MRN [id number]  PCP:  Michel Bickers, MD      History of Present Illness: Craig Knapp is a 68 y.o. male who had CABG in 2006. SHOB was his primary symptom. He had a lot of sweating. He never had an MI. Quit smoking after surgery. He uses some chewing tobacco still. No recent sx similar to what he had before his CABG. He has some DOE, but is more limited by joint pain and back pain. Tries to ride his bike somewhat. He has not been exercising regularly lately. He walks some 3-4 x /week, now limited by foot pain. CAD/ASCVD:  Portion size control for diet. Retired now. Denies : Chest pain.  Dizziness.  Leg edema.  Nitroglycerin.  Palpitations.  Paroxysmal nocturnal dyspnea.  Syncope.   Left foot pain.  He has been taking Aleve.  Tylenol has not worked for him.    Wt Readings from Last 3 Encounters:  07/03/14 226 lb (102.513 kg)  04/14/11 234 lb 8 oz (106.369 kg)  05/28/09 224 lb 11.2 oz (101.923 kg)     Past Medical History  Diagnosis Date  . Coronary atherosclerosis of native coronary artery     CABG 2006, EDMUNDS  . History of nuclear stress test     11/08 7 mets, no ischemia, EF 73%  . Essential hypertension, benign   . Other and unspecified hyperlipidemia   . Osteomyelitis     R tibia 1972, recur 2005  . DJD (degenerative joint disease) `    back and knees  . Male hypogonadism     Current Outpatient Prescriptions  Medication Sig Dispense Refill  . aspirin 81 MG tablet Take 81 mg by mouth daily.      Marland Kitchen atorvastatin (LIPITOR) 80 MG tablet Take 1 tablet (80 mg total) by mouth daily.  90 tablet  3  . lisinopril-hydrochlorothiazide (PRINZIDE,ZESTORETIC) 20-25 MG per tablet TAKE 1 TABLET BY MOUTH ONCE DAILY  30 tablet  1  . metoprolol  tartrate (LOPRESSOR) 25 MG tablet TAKE 1 TABLET BY MOUTH TWICE A DAY  180 tablet  2  . Multiple Vitamins tablet Take 1 tablet by mouth.      . naproxen sodium (ANAPROX) 220 MG tablet Take 220 mg by mouth 2 (two) times daily with a meal.      . Omega-3 Fatty Acids (FISH OIL) 1000 MG CAPS Take 2 capsules by mouth daily.         No current facility-administered medications for this visit.    Allergies:   No Known Allergies  Social History:  The patient  reports that he has never smoked. His smokeless tobacco use includes Snuff. He reports that he drinks about 3.5 ounces of alcohol per week. He reports that he does not use illicit drugs.   Family History:  The patient's family history includes Alcoholism in his father; Heart disease in his mother; Lung cancer in his mother.   ROS:  Please see the history of present illness.  No nausea, vomiting.  No fevers, chills.  No focal weakness.  No dysuria.    All other systems reviewed and negative.   PHYSICAL EXAM: VS:  BP 138/80  Pulse 56  Ht 5' 8.5" (1.74 m)  Wt  226 lb (102.513 kg)  BMI 33.86 kg/m2 Well nourished, well developed, in no acute distress HEENT: normal Neck: no JVD, no carotid bruits Cardiac:  normal S1, S2; RRR;  Lungs:  clear to auscultation bilaterally, no wheezing, rhonchi or rales Abd: soft, nontender, no hepatomegaly, obese Ext: no edema Skin: warm and dry Neuro:   no focal abnormalities noted  EKG:  NSR, RBBB  ASSESSMENT AND PLAN:  Coronary atherosclerosis of native coronary artery        IMAGING: EKG   In 2014         VARANASI,JAY 06/28/2013 01:38:49 PM > NSR, RBBB; inferior Q waves; no change since last year    Notes: No angina.    Continue medical therapy.  2. Essential hypertension, benign   Increase Lisinopril-Hydrochlorothiazide tablet, 40/25, 1 tablet, Orally, once a day Notes: Has not ben as well controlled.  INcrease exercise as well to the target specified below.  WOuld like for him to try something other  than NSAIDs for pain also.  ? Tramadol?  WOuld defer to Dr. Laurann Montana.        3. Hyperlipidemia   Notes: COntrolled.   LDL 50 in 3/15.  Annual checks at this point.    4. Obesity: Encouraged weight loss.  See below.             Preventive Medicine  Adult topics discussed:        Diet:  healthy diet.        Exercise:  5 days a week, at least 30 minutes of aerobic exercise.          Signed, Mina Marble, MD, [facility] 07/03/2014 9:49 AM

## 2014-07-03 NOTE — Patient Instructions (Addendum)
Your physician has recommended you make the following change in your medication:   1. Stop Lisinopril-HCT 20-25 mg.  2. Start Lisinopril-HCT 20-12.5 mg 2 tablets daily.   Your physician recommends that you return for lab work on 07/13/14.  Your physician wants you to follow-up in: 1 year with Dr. Irish Lack. You will receive a reminder letter in the mail two months in advance. If you don't receive a letter, please call our office to schedule the follow-up appointment.

## 2014-07-04 ENCOUNTER — Other Ambulatory Visit: Payer: Self-pay | Admitting: Interventional Cardiology

## 2014-07-06 DIAGNOSIS — Z23 Encounter for immunization: Secondary | ICD-10-CM | POA: Diagnosis not present

## 2014-07-09 ENCOUNTER — Ambulatory Visit (INDEPENDENT_AMBULATORY_CARE_PROVIDER_SITE_OTHER): Payer: Medicare Other

## 2014-07-09 ENCOUNTER — Ambulatory Visit (INDEPENDENT_AMBULATORY_CARE_PROVIDER_SITE_OTHER): Payer: Medicare Other | Admitting: Podiatry

## 2014-07-09 ENCOUNTER — Encounter: Payer: Self-pay | Admitting: Podiatry

## 2014-07-09 VITALS — BP 136/71 | HR 59 | Resp 16 | Ht 68.0 in | Wt 226.0 lb

## 2014-07-09 DIAGNOSIS — M2010 Hallux valgus (acquired), unspecified foot: Secondary | ICD-10-CM

## 2014-07-09 DIAGNOSIS — I2510 Atherosclerotic heart disease of native coronary artery without angina pectoris: Secondary | ICD-10-CM | POA: Diagnosis not present

## 2014-07-09 DIAGNOSIS — M7660 Achilles tendinitis, unspecified leg: Secondary | ICD-10-CM

## 2014-07-09 DIAGNOSIS — M722 Plantar fascial fibromatosis: Secondary | ICD-10-CM

## 2014-07-09 MED ORDER — TRIAMCINOLONE ACETONIDE 10 MG/ML IJ SUSP
10.0000 mg | Freq: Once | INTRAMUSCULAR | Status: AC
  Administered 2014-07-09: 10 mg

## 2014-07-09 NOTE — Patient Instructions (Addendum)

## 2014-07-09 NOTE — Progress Notes (Signed)
   Subjective:    Patient ID: Craig Knapp, male    DOB: 03-Apr-1946, 68 y.o.   MRN: [id number]  HPI Comments: "I am having some pain in this heel and ankle"  Patient c/o aching plantar and posterior heel left for few months. He has AM pain. He is retired now and rarely has on shoes at home. He has been taking Aleve and tried OTC heel lifts-some help. Tried some ice as well. Walking a lot makes worse.  Foot Pain Associated symptoms include arthralgias.      Review of Systems  Eyes: Positive for redness and itching.  Musculoskeletal: Positive for arthralgias.  Skin: Positive for wound.  Hematological: Bruises/bleeds easily.  All other systems reviewed and are negative.      Objective:   Physical Exam        Assessment & Plan:

## 2014-07-09 NOTE — Progress Notes (Signed)
Subjective:     Patient ID: Craig Knapp, male   DOB: October 18, 1946, 68 y.o.   MRN: [id number]  Foot Pain   patient presents with pain in his left foot stating that it's in my heel and on the outside and that it's been going on for a few months and at times it makes it hard for me to walk comfortably. Patient admits he does not wear shoes at home   Review of Systems  All other systems reviewed and are negative.      Objective:   Physical Exam  Nursing note and vitals reviewed. Cardiovascular: Intact distal pulses.   Musculoskeletal: Normal range of motion.  Neurological: He is alert.  Skin: Skin is warm.   neurovascular status intact with muscle strength adequate and range of motion subtalar and midtarsal joint within normal limits. Patient is noted to have large hyperostosis medial aspect first metatarsal head left it's red when pressed and has moderate depression of the arch bilateral. Has discomfort posterior heel on the lateral side with inflammation and fluid and no discomfort currently and the peroneal tendon group     Assessment:     Probable lateral posterior Achilles tendinitis left and moderate structural HAV deformity left    Plan:     H&P and x-rays reviewed. Today carefully injected the lateral side of the Achilles tendon after first going over with him the risk of the injection with 3 mg Kenalog 5 mg Xylocaine and advised him reduced activity and starting his daily stretching exercises along with ice. If symptoms persist she will reappoint

## 2014-07-13 ENCOUNTER — Other Ambulatory Visit (INDEPENDENT_AMBULATORY_CARE_PROVIDER_SITE_OTHER): Payer: Medicare Other

## 2014-07-13 DIAGNOSIS — Z79899 Other long term (current) drug therapy: Secondary | ICD-10-CM

## 2014-07-13 LAB — BASIC METABOLIC PANEL
BUN: 16 mg/dL (ref 6–23)
CO2: 24 meq/L (ref 19–32)
Calcium: 9.8 mg/dL (ref 8.4–10.5)
Chloride: 100 mEq/L (ref 96–112)
Creatinine, Ser: 1.1 mg/dL (ref 0.4–1.5)
GFR: 71 mL/min (ref >60.00)
GLUCOSE: 105 mg/dL — AB (ref 70–99)
Potassium: 4.4 mEq/L (ref 3.5–5.1)
Sodium: 134 mEq/L — ABNORMAL LOW (ref 135–145)

## 2014-07-18 ENCOUNTER — Telehealth: Payer: Self-pay | Admitting: Interventional Cardiology

## 2014-07-18 NOTE — Telephone Encounter (Signed)
Returned call to pt. See Bmet result note.

## 2014-07-18 NOTE — Telephone Encounter (Signed)
New Message  Pt called for Lab results//sr

## 2014-08-03 ENCOUNTER — Other Ambulatory Visit: Payer: Self-pay

## 2014-08-20 DIAGNOSIS — H40013 Open angle with borderline findings, low risk, bilateral: Secondary | ICD-10-CM | POA: Diagnosis not present

## 2014-09-27 ENCOUNTER — Other Ambulatory Visit: Payer: Self-pay | Admitting: Internal Medicine

## 2014-09-27 DIAGNOSIS — I10 Essential (primary) hypertension: Secondary | ICD-10-CM | POA: Diagnosis not present

## 2014-09-27 DIAGNOSIS — Z87891 Personal history of nicotine dependence: Secondary | ICD-10-CM

## 2014-10-09 DIAGNOSIS — M86461 Chronic osteomyelitis with draining sinus, right tibia and fibula: Secondary | ICD-10-CM | POA: Diagnosis not present

## 2014-10-23 ENCOUNTER — Ambulatory Visit
Admission: RE | Admit: 2014-10-23 | Discharge: 2014-10-23 | Disposition: A | Discharge status: Home / Self Care | Payer: Medicare Other | Source: Ambulatory Visit | Attending: Internal Medicine | Admitting: Internal Medicine

## 2014-10-23 DIAGNOSIS — Z87891 Personal history of nicotine dependence: Secondary | ICD-10-CM

## 2014-10-23 DIAGNOSIS — Z136 Encounter for screening for cardiovascular disorders: Secondary | ICD-10-CM | POA: Diagnosis not present

## 2014-11-27 DIAGNOSIS — L97914 Non-pressure chronic ulcer of unspecified part of right lower leg with necrosis of bone: Secondary | ICD-10-CM | POA: Diagnosis not present

## 2014-11-27 DIAGNOSIS — R937 Abnormal findings on diagnostic imaging of other parts of musculoskeletal system: Secondary | ICD-10-CM | POA: Diagnosis not present

## 2014-11-27 DIAGNOSIS — M86461 Chronic osteomyelitis with draining sinus, right tibia and fibula: Secondary | ICD-10-CM | POA: Diagnosis not present

## 2014-12-27 ENCOUNTER — Other Ambulatory Visit (INDEPENDENT_AMBULATORY_CARE_PROVIDER_SITE_OTHER): Payer: Medicare Other | Admitting: *Deleted

## 2014-12-27 DIAGNOSIS — E785 Hyperlipidemia, unspecified: Secondary | ICD-10-CM | POA: Diagnosis not present

## 2014-12-27 LAB — LIPID PANEL
CHOLESTEROL: 159 mg/dL (ref 0–200)
HDL: 72.3 mg/dL (ref >39.00)
LDL CALC: 56 mg/dL (ref 0–99)
NONHDL: 86.7
Total CHOL/HDL Ratio: 2
Triglycerides: 155 mg/dL — ABNORMAL HIGH (ref 0.0–149.0)
VLDL: 31 mg/dL (ref 0.0–40.0)

## 2014-12-27 LAB — HEPATIC FUNCTION PANEL
ALBUMIN: 4.6 g/dL (ref 3.5–5.2)
ALK PHOS: 63 U/L (ref 39–117)
ALT: 40 U/L (ref 0–53)
AST: 34 U/L (ref 0–37)
Bilirubin, Direct: 0.3 mg/dL (ref 0.0–0.3)
TOTAL PROTEIN: 7.5 g/dL (ref 6.0–8.3)
Total Bilirubin: 1.1 mg/dL (ref 0.2–1.2)

## 2014-12-28 ENCOUNTER — Other Ambulatory Visit: Payer: Self-pay | Admitting: *Deleted

## 2014-12-28 DIAGNOSIS — E785 Hyperlipidemia, unspecified: Secondary | ICD-10-CM

## 2014-12-31 ENCOUNTER — Other Ambulatory Visit: Payer: Self-pay | Admitting: Interventional Cardiology

## 2015-01-24 DIAGNOSIS — L570 Actinic keratosis: Secondary | ICD-10-CM | POA: Diagnosis not present

## 2015-01-24 DIAGNOSIS — D225 Melanocytic nevi of trunk: Secondary | ICD-10-CM | POA: Diagnosis not present

## 2015-01-24 DIAGNOSIS — L821 Other seborrheic keratosis: Secondary | ICD-10-CM | POA: Diagnosis not present

## 2015-01-24 DIAGNOSIS — L814 Other melanin hyperpigmentation: Secondary | ICD-10-CM | POA: Diagnosis not present

## 2015-02-14 ENCOUNTER — Encounter: Payer: Self-pay | Admitting: Podiatry

## 2015-02-14 ENCOUNTER — Ambulatory Visit (INDEPENDENT_AMBULATORY_CARE_PROVIDER_SITE_OTHER): Payer: Medicare Other | Admitting: Podiatry

## 2015-02-14 VITALS — BP 136/76 | HR 61 | Resp 14

## 2015-02-14 DIAGNOSIS — M7662 Achilles tendinitis, left leg: Secondary | ICD-10-CM

## 2015-02-14 MED ORDER — TRIAMCINOLONE ACETONIDE 10 MG/ML IJ SUSP
10.0000 mg | Freq: Once | INTRAMUSCULAR | Status: AC
  Administered 2015-02-14: 10 mg

## 2015-02-14 NOTE — Progress Notes (Signed)
Subjective:     Patient ID: Craig Knapp, male   DOB: 09/07/1946, 69 y.o.   MRN: [id number]  HPI patient states my left heel is sore in the back and making it hard for me to walk comfortably. The right is doing extremely well area   Review of Systems     Objective:   Physical Exam Neurovascular status found to be intact with discomfort posterior lateral aspect left heel not directly on the Achilles tendon but slightly lateral with inflammation and fluid around the area and doing excellent on the right with patient extent about the results of previous injection    Assessment:     Achilles tendinitis left lateral side with well-healed surgical area right    Plan:     Reviewed condition and recommended careful steroid injection explaining risk to patient. Patient wants procedure and I carefully injected the lateral side stating away from the central and medial side of the tendon with 3 mg Dexon some Kenalog 5 mg Xylocaine and reduce activity ice therapy and see back as needed

## 2015-04-01 ENCOUNTER — Other Ambulatory Visit (INDEPENDENT_AMBULATORY_CARE_PROVIDER_SITE_OTHER): Payer: Medicare Other | Admitting: *Deleted

## 2015-04-01 DIAGNOSIS — E785 Hyperlipidemia, unspecified: Secondary | ICD-10-CM | POA: Diagnosis not present

## 2015-04-01 LAB — LIPID PANEL
CHOL/HDL RATIO: 2
Cholesterol: 137 mg/dL (ref 0–200)
HDL: 64.5 mg/dL (ref >39.00)
LDL Cholesterol: 50 mg/dL (ref 0–99)
NonHDL: 72.5
Triglycerides: 111 mg/dL (ref 0.0–149.0)
VLDL: 22.2 mg/dL (ref 0.0–40.0)

## 2015-04-04 ENCOUNTER — Telehealth: Payer: Self-pay | Admitting: Interventional Cardiology

## 2015-04-04 NOTE — Telephone Encounter (Signed)
Returned pt's phone call and informed pt of lab results. Pt verbalized understanding.

## 2015-04-04 NOTE — Telephone Encounter (Signed)
New Message   Pt is calling in because he states he is returning [place] call that was left on the vm

## 2015-04-08 DIAGNOSIS — I10 Essential (primary) hypertension: Secondary | ICD-10-CM | POA: Diagnosis not present

## 2015-04-08 DIAGNOSIS — Z125 Encounter for screening for malignant neoplasm of prostate: Secondary | ICD-10-CM | POA: Diagnosis not present

## 2015-04-08 DIAGNOSIS — Z1389 Encounter for screening for other disorder: Secondary | ICD-10-CM | POA: Diagnosis not present

## 2015-04-15 ENCOUNTER — Other Ambulatory Visit: Payer: Self-pay

## 2015-04-16 DIAGNOSIS — L97914 Non-pressure chronic ulcer of unspecified part of right lower leg with necrosis of bone: Secondary | ICD-10-CM | POA: Diagnosis not present

## 2015-04-16 DIAGNOSIS — M86461 Chronic osteomyelitis with draining sinus, right tibia and fibula: Secondary | ICD-10-CM | POA: Diagnosis not present

## 2015-06-25 DIAGNOSIS — L820 Inflamed seborrheic keratosis: Secondary | ICD-10-CM | POA: Diagnosis not present

## 2015-07-01 ENCOUNTER — Other Ambulatory Visit: Payer: Self-pay | Admitting: Interventional Cardiology

## 2015-07-04 DIAGNOSIS — M545 Low back pain: Secondary | ICD-10-CM | POA: Diagnosis not present

## 2015-07-04 DIAGNOSIS — Z23 Encounter for immunization: Secondary | ICD-10-CM | POA: Diagnosis not present

## 2015-07-16 ENCOUNTER — Encounter: Payer: Self-pay | Admitting: Interventional Cardiology

## 2015-07-16 ENCOUNTER — Ambulatory Visit (INDEPENDENT_AMBULATORY_CARE_PROVIDER_SITE_OTHER): Payer: Medicare Other | Admitting: Interventional Cardiology

## 2015-07-16 VITALS — BP 110/56 | HR 56 | Ht 68.0 in | Wt 225.8 lb

## 2015-07-16 DIAGNOSIS — I25810 Atherosclerosis of coronary artery bypass graft(s) without angina pectoris: Secondary | ICD-10-CM | POA: Diagnosis not present
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
## 2015-07-16 NOTE — Patient Instructions (Signed)
Medication Instructions:  Your physician recommends that you continue on your current medications as directed. Please refer to the Current Medication list given to you today.   Labwork: Your physician recommends that you return for a FASTING lipid profile and cmet in June 2017   Testing/Procedures: None ordered  Follow-Up: Your physician wants you to follow-up in: 1 year with Dr.Varanasi You will receive a reminder letter in the mail two months in advance. If you don't receive a letter, please call our office to schedule the follow-up appointment.   Any Other Special Instructions Will Be Listed Below (If Applicable). Please reduce your alcohol intake to help with weight loss

## 2015-08-13 DIAGNOSIS — H40013 Open angle with borderline findings, low risk, bilateral: Secondary | ICD-10-CM | POA: Diagnosis not present

## 2015-08-14 ENCOUNTER — Ambulatory Visit: Payer: Medicare Other

## 2015-08-14 ENCOUNTER — Ambulatory Visit (INDEPENDENT_AMBULATORY_CARE_PROVIDER_SITE_OTHER): Payer: Medicare Other | Admitting: Podiatry

## 2015-08-14 ENCOUNTER — Encounter: Payer: Self-pay | Admitting: Podiatry

## 2015-08-14 VITALS — BP 134/66 | HR 57 | Resp 16

## 2015-08-14 DIAGNOSIS — M7662 Achilles tendinitis, left leg: Secondary | ICD-10-CM

## 2015-08-14 DIAGNOSIS — M79672 Pain in left foot: Secondary | ICD-10-CM

## 2015-08-14 MED ORDER — TRIAMCINOLONE ACETONIDE 10 MG/ML IJ SUSP
10.0000 mg | Freq: Once | INTRAMUSCULAR | Status: AC
  Administered 2015-08-14: 10 mg

## 2015-08-14 MED ORDER — TRIAMCINOLONE ACETONIDE 10 MG/ML IJ SUSP: 10.0000 mg | Freq: Once | INTRAMUSCULAR | Status: DC

## 2015-08-14 NOTE — Progress Notes (Signed)
Subjective:     Patient ID: Craig Knapp, male   DOB: 1945-11-15, 69 y.o.   MRN: [id number]  HPI patient states I've just started to develop discomfort in the back of this left heel again and it did great for 6 months   Review of Systems     Objective:   Physical Exam Neurovascular status was intact with patient noted to have quite a bit of inflammation around the lateral side of the Achilles tendon insertion with a central and medial side doing well    Assessment:     Lateral Achilles tendinitis left    Plan:     Careful lateral injection administered after explaining chances for risk which she understands and if symptoms were to come back quickly work and the need to consider shockwave for this patient

## 2015-08-19 DIAGNOSIS — H40013 Open angle with borderline findings, low risk, bilateral: Secondary | ICD-10-CM | POA: Diagnosis not present

## 2015-12-02 ENCOUNTER — Ambulatory Visit (INDEPENDENT_AMBULATORY_CARE_PROVIDER_SITE_OTHER): Payer: Medicare Other | Admitting: Podiatry

## 2015-12-02 ENCOUNTER — Encounter: Payer: Self-pay | Admitting: Podiatry

## 2015-12-02 VITALS — BP 124/69 | HR 60 | Resp 12

## 2015-12-02 DIAGNOSIS — M7662 Achilles tendinitis, left leg: Secondary | ICD-10-CM | POA: Diagnosis not present

## 2015-12-03 NOTE — Progress Notes (Signed)
Subjective:     Patient ID: Craig Knapp, male   DOB: May 25, 1946, 70 y.o.   MRN: [id number]  HPI patient states he wore the wrong type of shoe and developed inflammation in the back of his left heel again   Review of Systems     Objective:   Physical Exam Neurovascular status intact no other health history changes with discomfort in the posterior aspect of the left heel that is mild in intensity at this time with minimal erythema edema noted    Assessment:     Achilles tendinitis left which has become sore again but is improved but not this inflamed as previously    Plan:     Advised I do not want to do injection at this time and we will focus on supportive shoe gear and I did dispense heel lifts in order to support the plantar foot. Gave instructions on usage along with ice therapy and reappoint if acute inflammation were to occur

## 2015-12-30 ENCOUNTER — Other Ambulatory Visit: Payer: Self-pay | Admitting: Interventional Cardiology

## 2016-01-02 ENCOUNTER — Ambulatory Visit (INDEPENDENT_AMBULATORY_CARE_PROVIDER_SITE_OTHER): Payer: Medicare Other | Admitting: Podiatry

## 2016-01-02 ENCOUNTER — Encounter: Payer: Self-pay | Admitting: Podiatry

## 2016-01-02 VITALS — BP 120/63 | HR 62 | Resp 16

## 2016-01-02 DIAGNOSIS — M79672 Pain in left foot: Secondary | ICD-10-CM | POA: Diagnosis not present

## 2016-01-02 DIAGNOSIS — M7662 Achilles tendinitis, left leg: Secondary | ICD-10-CM

## 2016-01-02 MED ORDER — TRIAMCINOLONE ACETONIDE 10 MG/ML IJ SUSP
10.0000 mg | Freq: Once | INTRAMUSCULAR | Status: AC
Start: 2016-01-02 — End: 2016-01-02
  Administered 2016-01-02: 10 mg

## 2016-01-08 NOTE — Progress Notes (Signed)
Subjective:     Patient ID: Craig Knapp, male   DOB: 04-16-1946, 70 y.o.   MRN: [id number]  HPI patient states he's having pain again in the back of his left heel and it makes it hard to walk comfortably. It's not been treated for a fairly long period of time   Review of Systems     Objective:   Physical Exam Neurovascular status intact with posterior pain left heel lateral side that's localized in nature with no central or medial discomfort noted    Assessment:     Reoccurrence of Achilles tendinitis left    Plan:     Explained risk of injection including chances for rupture and patient is willing to accept risk. I did careful lateral injection 3 mg dexamethasone Kenalog 5 mg Xylocaine advised on heel lift and reduced activity and discussed shockwave if symptoms persist

## 2016-01-23 DIAGNOSIS — L821 Other seborrheic keratosis: Secondary | ICD-10-CM | POA: Diagnosis not present

## 2016-01-23 DIAGNOSIS — L570 Actinic keratosis: Secondary | ICD-10-CM | POA: Diagnosis not present

## 2016-01-23 DIAGNOSIS — L812 Freckles: Secondary | ICD-10-CM | POA: Diagnosis not present

## 2016-03-27 ENCOUNTER — Encounter (HOSPITAL_BASED_OUTPATIENT_CLINIC_OR_DEPARTMENT_OTHER): Payer: Medicare Other | Attending: Internal Medicine

## 2016-03-27 DIAGNOSIS — M8668 Other chronic osteomyelitis, other site: Secondary | ICD-10-CM | POA: Insufficient documentation

## 2016-03-27 DIAGNOSIS — I10 Essential (primary) hypertension: Secondary | ICD-10-CM | POA: Insufficient documentation

## 2016-03-27 DIAGNOSIS — L97819 Non-pressure chronic ulcer of other part of right lower leg with unspecified severity: Secondary | ICD-10-CM | POA: Insufficient documentation

## 2016-03-27 DIAGNOSIS — Z951 Presence of aortocoronary bypass graft: Secondary | ICD-10-CM | POA: Insufficient documentation

## 2016-03-27 DIAGNOSIS — Z87891 Personal history of nicotine dependence: Secondary | ICD-10-CM | POA: Insufficient documentation

## 2016-03-27 DIAGNOSIS — M1990 Unspecified osteoarthritis, unspecified site: Secondary | ICD-10-CM | POA: Insufficient documentation

## 2016-03-27 DIAGNOSIS — I2510 Atherosclerotic heart disease of native coronary artery without angina pectoris: Secondary | ICD-10-CM | POA: Insufficient documentation

## 2016-03-27 DIAGNOSIS — B965 Pseudomonas (aeruginosa) (mallei) (pseudomallei) as the cause of diseases classified elsewhere: Secondary | ICD-10-CM | POA: Insufficient documentation

## 2016-03-27 DIAGNOSIS — I87301 Chronic venous hypertension (idiopathic) without complications of right lower extremity: Secondary | ICD-10-CM | POA: Insufficient documentation

## 2016-04-08 DIAGNOSIS — Z1211 Encounter for screening for malignant neoplasm of colon: Secondary | ICD-10-CM | POA: Diagnosis not present

## 2016-04-08 DIAGNOSIS — Z0000 Encounter for general adult medical examination without abnormal findings: Secondary | ICD-10-CM | POA: Diagnosis not present

## 2016-04-08 DIAGNOSIS — Z1389 Encounter for screening for other disorder: Secondary | ICD-10-CM | POA: Diagnosis not present

## 2016-04-08 DIAGNOSIS — M86669 Other chronic osteomyelitis, unspecified tibia and fibula: Secondary | ICD-10-CM | POA: Diagnosis not present

## 2016-04-08 DIAGNOSIS — Z7189 Other specified counseling: Secondary | ICD-10-CM | POA: Diagnosis not present

## 2016-04-08 DIAGNOSIS — I10 Essential (primary) hypertension: Secondary | ICD-10-CM | POA: Diagnosis not present

## 2016-04-09 DIAGNOSIS — M86461 Chronic osteomyelitis with draining sinus, right tibia and fibula: Secondary | ICD-10-CM | POA: Diagnosis not present

## 2016-04-09 DIAGNOSIS — Z87891 Personal history of nicotine dependence: Secondary | ICD-10-CM | POA: Diagnosis not present

## 2016-04-09 DIAGNOSIS — L97819 Non-pressure chronic ulcer of other part of right lower leg with unspecified severity: Secondary | ICD-10-CM | POA: Diagnosis not present

## 2016-04-09 DIAGNOSIS — M1990 Unspecified osteoarthritis, unspecified site: Secondary | ICD-10-CM | POA: Diagnosis not present

## 2016-04-09 DIAGNOSIS — I2510 Atherosclerotic heart disease of native coronary artery without angina pectoris: Secondary | ICD-10-CM | POA: Diagnosis not present

## 2016-04-09 DIAGNOSIS — I87301 Chronic venous hypertension (idiopathic) without complications of right lower extremity: Secondary | ICD-10-CM | POA: Diagnosis not present

## 2016-04-09 DIAGNOSIS — M8668 Other chronic osteomyelitis, other site: Secondary | ICD-10-CM | POA: Diagnosis not present

## 2016-04-09 DIAGNOSIS — B965 Pseudomonas (aeruginosa) (mallei) (pseudomallei) as the cause of diseases classified elsewhere: Secondary | ICD-10-CM | POA: Diagnosis not present

## 2016-04-09 DIAGNOSIS — Z951 Presence of aortocoronary bypass graft: Secondary | ICD-10-CM | POA: Diagnosis not present

## 2016-04-09 DIAGNOSIS — M86661 Other chronic osteomyelitis, right tibia and fibula: Secondary | ICD-10-CM | POA: Diagnosis not present

## 2016-04-09 DIAGNOSIS — L97814 Non-pressure chronic ulcer of other part of right lower leg with necrosis of bone: Secondary | ICD-10-CM | POA: Diagnosis not present

## 2016-04-09 DIAGNOSIS — I10 Essential (primary) hypertension: Secondary | ICD-10-CM | POA: Diagnosis not present

## 2016-04-13 DIAGNOSIS — S60562A Insect bite (nonvenomous) of left hand, initial encounter: Secondary | ICD-10-CM | POA: Diagnosis not present

## 2016-04-13 DIAGNOSIS — T63441A Toxic effect of venom of bees, accidental (unintentional), initial encounter: Secondary | ICD-10-CM | POA: Diagnosis not present

## 2016-04-16 DIAGNOSIS — M8668 Other chronic osteomyelitis, other site: Secondary | ICD-10-CM | POA: Diagnosis not present

## 2016-04-16 DIAGNOSIS — B965 Pseudomonas (aeruginosa) (mallei) (pseudomallei) as the cause of diseases classified elsewhere: Secondary | ICD-10-CM | POA: Diagnosis not present

## 2016-04-16 DIAGNOSIS — L97819 Non-pressure chronic ulcer of other part of right lower leg with unspecified severity: Secondary | ICD-10-CM | POA: Diagnosis not present

## 2016-04-16 DIAGNOSIS — I87301 Chronic venous hypertension (idiopathic) without complications of right lower extremity: Secondary | ICD-10-CM | POA: Diagnosis not present

## 2016-04-16 DIAGNOSIS — Z951 Presence of aortocoronary bypass graft: Secondary | ICD-10-CM | POA: Diagnosis not present

## 2016-04-16 DIAGNOSIS — Z87891 Personal history of nicotine dependence: Secondary | ICD-10-CM | POA: Diagnosis not present

## 2016-04-16 DIAGNOSIS — S81801A Unspecified open wound, right lower leg, initial encounter: Secondary | ICD-10-CM | POA: Diagnosis not present

## 2016-04-22 DIAGNOSIS — M86461 Chronic osteomyelitis with draining sinus, right tibia and fibula: Secondary | ICD-10-CM | POA: Diagnosis not present

## 2016-04-22 DIAGNOSIS — Z8679 Personal history of other diseases of the circulatory system: Secondary | ICD-10-CM | POA: Diagnosis not present

## 2016-04-24 ENCOUNTER — Encounter (HOSPITAL_BASED_OUTPATIENT_CLINIC_OR_DEPARTMENT_OTHER): Payer: Medicare Other

## 2016-04-30 ENCOUNTER — Encounter (HOSPITAL_BASED_OUTPATIENT_CLINIC_OR_DEPARTMENT_OTHER): Payer: Medicare Other | Attending: Internal Medicine

## 2016-04-30 DIAGNOSIS — Z951 Presence of aortocoronary bypass graft: Secondary | ICD-10-CM | POA: Insufficient documentation

## 2016-04-30 DIAGNOSIS — M86461 Chronic osteomyelitis with draining sinus, right tibia and fibula: Secondary | ICD-10-CM | POA: Insufficient documentation

## 2016-04-30 DIAGNOSIS — L97814 Non-pressure chronic ulcer of other part of right lower leg with necrosis of bone: Secondary | ICD-10-CM | POA: Diagnosis not present

## 2016-04-30 DIAGNOSIS — B965 Pseudomonas (aeruginosa) (mallei) (pseudomallei) as the cause of diseases classified elsewhere: Secondary | ICD-10-CM | POA: Insufficient documentation

## 2016-04-30 DIAGNOSIS — I87311 Chronic venous hypertension (idiopathic) with ulcer of right lower extremity: Secondary | ICD-10-CM | POA: Diagnosis not present

## 2016-04-30 DIAGNOSIS — I2510 Atherosclerotic heart disease of native coronary artery without angina pectoris: Secondary | ICD-10-CM | POA: Insufficient documentation

## 2016-04-30 DIAGNOSIS — S81801A Unspecified open wound, right lower leg, initial encounter: Secondary | ICD-10-CM | POA: Diagnosis not present

## 2016-05-12 DIAGNOSIS — M86661 Other chronic osteomyelitis, right tibia and fibula: Secondary | ICD-10-CM | POA: Diagnosis not present

## 2016-05-12 DIAGNOSIS — I10 Essential (primary) hypertension: Secondary | ICD-10-CM | POA: Diagnosis not present

## 2016-05-12 DIAGNOSIS — I2510 Atherosclerotic heart disease of native coronary artery without angina pectoris: Secondary | ICD-10-CM | POA: Diagnosis not present

## 2016-05-12 DIAGNOSIS — E785 Hyperlipidemia, unspecified: Secondary | ICD-10-CM | POA: Diagnosis not present

## 2016-05-12 DIAGNOSIS — Z951 Presence of aortocoronary bypass graft: Secondary | ICD-10-CM | POA: Diagnosis not present

## 2016-05-12 DIAGNOSIS — M86461 Chronic osteomyelitis with draining sinus, right tibia and fibula: Secondary | ICD-10-CM | POA: Diagnosis not present

## 2016-06-23 ENCOUNTER — Other Ambulatory Visit: Payer: Self-pay | Admitting: Interventional Cardiology

## 2016-06-24 ENCOUNTER — Other Ambulatory Visit: Payer: Medicare Other | Admitting: *Deleted

## 2016-06-24 DIAGNOSIS — E785 Hyperlipidemia, unspecified: Secondary | ICD-10-CM

## 2016-06-24 LAB — COMPREHENSIVE METABOLIC PANEL
ALT: 26 U/L (ref 9–46)
AST: 28 U/L (ref 10–35)
Albumin: 4.3 g/dL (ref 3.6–5.1)
Alkaline Phosphatase: 63 U/L (ref 40–115)
BUN: 11 mg/dL (ref 7–25)
CHLORIDE: 101 mmol/L (ref 98–110)
CO2: 25 mmol/L (ref 20–31)
Calcium: 9.3 mg/dL (ref 8.6–10.3)
Creat: 0.87 mg/dL (ref 0.70–1.25)
Glucose, Bld: 98 mg/dL (ref 65–99)
POTASSIUM: 4.5 mmol/L (ref 3.5–5.3)
Sodium: 137 mmol/L (ref 135–146)
Total Bilirubin: 1.5 mg/dL — ABNORMAL HIGH (ref 0.2–1.2)
Total Protein: 6.8 g/dL (ref 6.1–8.1)

## 2016-06-24 LAB — LIPID PANEL
Cholesterol: 136 mg/dL (ref 125–200)
HDL: 97 mg/dL (ref >=40)
LDL Cholesterol: 20 mg/dL (ref <130)
TRIGLYCERIDES: 93 mg/dL (ref <150)
Total CHOL/HDL Ratio: 1.4 Ratio (ref <=5.0)
VLDL: 19 mg/dL (ref <30)

## 2016-06-24 NOTE — Addendum Note (Signed)
Addended by: Eulis Foster on: 06/24/2016 07:38 AM   Modules accepted: Orders

## 2016-07-01 ENCOUNTER — Encounter: Payer: Self-pay | Admitting: Interventional Cardiology

## 2016-07-07 DIAGNOSIS — Z951 Presence of aortocoronary bypass graft: Secondary | ICD-10-CM | POA: Diagnosis not present

## 2016-07-07 DIAGNOSIS — I2510 Atherosclerotic heart disease of native coronary artery without angina pectoris: Secondary | ICD-10-CM | POA: Diagnosis not present

## 2016-07-07 DIAGNOSIS — E785 Hyperlipidemia, unspecified: Secondary | ICD-10-CM | POA: Diagnosis not present

## 2016-07-07 DIAGNOSIS — M86661 Other chronic osteomyelitis, right tibia and fibula: Secondary | ICD-10-CM | POA: Diagnosis not present

## 2016-07-07 DIAGNOSIS — M86461 Chronic osteomyelitis with draining sinus, right tibia and fibula: Secondary | ICD-10-CM | POA: Diagnosis not present

## 2016-07-07 DIAGNOSIS — Z9889 Other specified postprocedural states: Secondary | ICD-10-CM | POA: Diagnosis not present

## 2016-07-07 DIAGNOSIS — I10 Essential (primary) hypertension: Secondary | ICD-10-CM | POA: Diagnosis not present

## 2016-07-07 DIAGNOSIS — L97914 Non-pressure chronic ulcer of unspecified part of right lower leg with necrosis of bone: Secondary | ICD-10-CM | POA: Diagnosis not present

## 2016-07-07 DIAGNOSIS — Z79899 Other long term (current) drug therapy: Secondary | ICD-10-CM | POA: Diagnosis not present

## 2016-07-07 DIAGNOSIS — Z7982 Long term (current) use of aspirin: Secondary | ICD-10-CM | POA: Diagnosis not present

## 2016-07-08 DIAGNOSIS — L97914 Non-pressure chronic ulcer of unspecified part of right lower leg with necrosis of bone: Secondary | ICD-10-CM | POA: Diagnosis not present

## 2016-07-08 DIAGNOSIS — L97912 Non-pressure chronic ulcer of unspecified part of right lower leg with fat layer exposed: Secondary | ICD-10-CM | POA: Diagnosis not present

## 2016-07-08 DIAGNOSIS — M86461 Chronic osteomyelitis with draining sinus, right tibia and fibula: Secondary | ICD-10-CM | POA: Diagnosis not present

## 2016-07-15 ENCOUNTER — Ambulatory Visit (INDEPENDENT_AMBULATORY_CARE_PROVIDER_SITE_OTHER): Payer: Medicare Other | Admitting: Interventional Cardiology

## 2016-07-15 ENCOUNTER — Encounter: Payer: Self-pay | Admitting: Interventional Cardiology

## 2016-07-15 VITALS — BP 130/70 | HR 55 | Ht 68.0 in | Wt 221.0 lb

## 2016-07-15 DIAGNOSIS — E785 Hyperlipidemia, unspecified: Secondary | ICD-10-CM

## 2016-07-15 DIAGNOSIS — I2510 Atherosclerotic heart disease of native coronary artery without angina pectoris: Secondary | ICD-10-CM

## 2016-07-15 DIAGNOSIS — I10 Essential (primary) hypertension: Secondary | ICD-10-CM | POA: Diagnosis not present

## 2016-07-15 DIAGNOSIS — E669 Obesity, unspecified: Secondary | ICD-10-CM

## 2016-07-15 NOTE — Progress Notes (Signed)
Patient ID: Craig Knapp, male   DOB: [birth year], 70 y.o.   MRN: [id number]     Cardiology Office Note   Date:  07/15/2016   ID:  Craig Knapp, DOB 31-Aug-1946, MRN [id number]  PCP:  Irven Shelling, MD    No chief complaint on file.  CAD  Wt Readings from Last 3 Encounters:  07/15/16 221 lb (100.2 kg)  07/16/15 225 lb 12.8 oz (102.4 kg)  07/09/14 226 lb (102.5 kg)       History of Present Illness: Craig Knapp is a 70 y.o. male  who had CABG in 2006. SHOB was his primary symptom along with tightness in his chest.  He had a lot of sweating. He never had an MI. Quit smoking after surgery. He uses some chewing tobacco still.   No recent sx similar to what he had before his CABG. He has some DOE, but is more limited by joint pain and back pain. He is back to exercising regularly lately. He walks some 3-4 x /week, but he could do more.  He still has a wound on his right shin.  Managed medically at [facility] by ID, ortho.   Does not affect walking.  Portion size control for diet. Retired now. Denies : Chest pain. Dizziness. Leg edema. Nitroglycerin. Palpitations. Paroxysmal nocturnal dyspnea. Syncope.   Wants to try to get to  200 lbs.  Has not made any progress in the last year.    He has 4-5 beers in the afternoon and evening.  He has not decreased from this compared to last year despite instruction, to try to decrease weight.    Past Medical History:  Diagnosis Date  . Coronary atherosclerosis of native coronary artery    CABG 2006, EDMUNDS  . DJD (degenerative joint disease) `   back and knees  . Essential hypertension, benign   . History of nuclear stress test    11/08 7 mets, no ischemia, EF 73%  . Male hypogonadism   . Osteomyelitis ([place])    R tibia 1972, recur 2005  . Other and unspecified hyperlipidemia     History reviewed. No pertinent surgical history.   Current Outpatient Prescriptions  Medication Sig Dispense Refill  . aspirin 81 MG tablet Take  81 mg by mouth daily.    Marland Kitchen atorvastatin (LIPITOR) 80 MG tablet TAKE 1 TABLET BY MOUTH DAILY. 90 tablet 2  . fluticasone (FLONASE) 50 MCG/ACT nasal spray Place into both nostrils as needed for allergies or rhinitis.    Marland Kitchen lisinopril-hydrochlorothiazide (PRINZIDE,ZESTORETIC) 20-12.5 MG tablet TAKE 2 TABLETS BY MOUTH DAILY. 180 tablet 0  . loratadine (CLARITIN) 10 MG tablet Take 10 mg by mouth daily.    . metoprolol tartrate (LOPRESSOR) 25 MG tablet TAKE 1 TABLET BY MOUTH TWICE A DAY 180 tablet 0  . Multiple Vitamins tablet Take 1 tablet by mouth daily.     . naproxen sodium (ANAPROX) 220 MG tablet Take 220 mg by mouth as needed (PAIN).     . Omega-3 Fatty Acids (FISH OIL) 1000 MG CAPS Take 2 capsules by mouth daily.       Current Facility-Administered Medications  Medication Dose Route Frequency Provider Last Rate Last Dose  . triamcinolone acetonide (KENALOG) 10 MG/ML injection 10 mg  10 mg Other Once Wallene Huh, DPM        Allergies:   Review of patient's allergies indicates no known allergies.    Social History:  The patient  reports that he has never smoked. His  smokeless tobacco use includes Snuff. He reports that he drinks about 4.2 oz of alcohol per week . He reports that he does not use drugs.   Family History:  The patient's family history includes Alcoholism in his father; Heart disease in his mother; Lung cancer in his mother.    ROS:  Please see the history of present illness.   Otherwise, review of systems are positive for back pain, difficulty losing weight..   All other systems are reviewed and negative.    PHYSICAL EXAM: VS:  BP 130/70   Pulse (!) 55   Ht 5\' 8"  (1.727 m)   Wt 221 lb (100.2 kg)   BMI 33.60 kg/m  , BMI Body mass index is 33.6 kg/m. GEN: Well nourished, well developed, in no acute distress  HEENT: normal  Neck: no JVD, carotid bruits, or masses Cardiac: RRR; no murmurs, rubs, or gallops, 2+ PT bilaterally Respiratory:  clear to auscultation  bilaterally, normal work of breathing GI: soft, nontender, nondistended, + BS MS: no deformity or atrophy  Skin: warm and dry, no rash. Right shin bandaged, open wound present Neuro:  Strength and sensation are intact Psych: euthymic mood, full affect   EKG:   The ekg ordered today demonstrates sinus bradycardia, RBBB   Recent Labs: 06/24/2016: ALT 26; BUN 11; Creat 0.87; Potassium 4.5; Sodium 137   Lipid Panel    Component Value Date/Time   CHOL 136 06/24/2016 0738   TRIG 93 06/24/2016 0738   HDL 97 06/24/2016 0738   CHOLHDL 1.4 06/24/2016 0738   VLDL 19 06/24/2016 0738   LDLCALC 20 06/24/2016 0738     Other studies Reviewed: Additional studies/ records that were reviewed today with results demonstrating: small defect on 2008 stress test, normal LV function.   ASSESSMENT AND PLAN:  1. CAD: No angina.  No change in ECG.  CONtinue aggressive secondary prevention.  Let us know if there is any change in sx.  2. Essential HTN: BP well controlled. Continue current medications.  Typically in the 127- 140 range.   3. Hyperlipidemia: Triglycerides much improved from before. Results noted above. Recheck in September 2018. 4. Obesity: We spoke about increasing exercise.  He is not getting 150 minutes/week. He also is drinking a lot of beer. I think cutting back on the alcohol intake would also decrease his caloric intake and thus help him lose weight.   Current medicines are reviewed at length with the patient today.  The patient concerns regarding his medicines were addressed.  The following changes have been made:  No change  Labs/ tests ordered today include:  No orders of the defined types were placed in this encounter.   Recommend 150 minutes/week of aerobic exercise Low fat, low carb, high fiber diet recommended  Disposition:   FU in 1 year   Signed, Larae Grooms, MD  07/15/2016 8:34 AM    [facility] [place], [place], [place]   [address] Phone: [phone]; Fax: [phone]

## 2016-07-15 NOTE — Patient Instructions (Addendum)
**Note De-Identified Miri Jose Obfuscation** Medication Instructions:  Same-no changes  Labwork: CMET and Lipids just prior to your next office visit.  Testing/Procedures: None  Follow-Up: Your physician wants you to follow-up in: 1 year. You will receive a reminder letter in the mail two months in advance. If you don't receive a letter, please call our office to schedule the follow-up appointment.     If you need a refill on your cardiac medications before your next appointment, please call your pharmacy.

## 2016-07-17 DIAGNOSIS — M7732 Calcaneal spur, left foot: Secondary | ICD-10-CM | POA: Diagnosis not present

## 2016-07-17 DIAGNOSIS — M71572 Other bursitis, not elsewhere classified, left ankle and foot: Secondary | ICD-10-CM | POA: Diagnosis not present

## 2016-07-17 DIAGNOSIS — M7662 Achilles tendinitis, left leg: Secondary | ICD-10-CM | POA: Diagnosis not present

## 2016-07-22 DIAGNOSIS — Z23 Encounter for immunization: Secondary | ICD-10-CM | POA: Diagnosis not present

## 2016-07-27 DIAGNOSIS — L578 Other skin changes due to chronic exposure to nonionizing radiation: Secondary | ICD-10-CM | POA: Diagnosis not present

## 2016-07-27 DIAGNOSIS — L570 Actinic keratosis: Secondary | ICD-10-CM | POA: Diagnosis not present

## 2016-08-13 DIAGNOSIS — H40013 Open angle with borderline findings, low risk, bilateral: Secondary | ICD-10-CM | POA: Diagnosis not present

## 2016-08-17 DIAGNOSIS — D123 Benign neoplasm of transverse colon: Secondary | ICD-10-CM | POA: Diagnosis not present

## 2016-08-17 DIAGNOSIS — Z86010 Personal history of colonic polyps: Secondary | ICD-10-CM | POA: Diagnosis not present

## 2016-08-17 DIAGNOSIS — D126 Benign neoplasm of colon, unspecified: Secondary | ICD-10-CM | POA: Diagnosis not present

## 2016-08-17 DIAGNOSIS — D125 Benign neoplasm of sigmoid colon: Secondary | ICD-10-CM | POA: Diagnosis not present

## 2016-08-17 DIAGNOSIS — K640 First degree hemorrhoids: Secondary | ICD-10-CM | POA: Diagnosis not present

## 2016-08-17 DIAGNOSIS — K5730 Diverticulosis of large intestine without perforation or abscess without bleeding: Secondary | ICD-10-CM | POA: Diagnosis not present

## 2016-08-20 DIAGNOSIS — D126 Benign neoplasm of colon, unspecified: Secondary | ICD-10-CM | POA: Diagnosis not present

## 2016-09-22 ENCOUNTER — Other Ambulatory Visit: Payer: Self-pay | Admitting: Interventional Cardiology

## 2016-10-30 DIAGNOSIS — I10 Essential (primary) hypertension: Secondary | ICD-10-CM | POA: Diagnosis not present

## 2016-11-10 DIAGNOSIS — L258 Unspecified contact dermatitis due to other agents: Secondary | ICD-10-CM | POA: Diagnosis not present

## 2016-11-10 DIAGNOSIS — M86461 Chronic osteomyelitis with draining sinus, right tibia and fibula: Secondary | ICD-10-CM | POA: Diagnosis not present

## 2016-11-10 DIAGNOSIS — L231 Allergic contact dermatitis due to adhesives: Secondary | ICD-10-CM | POA: Diagnosis not present

## 2016-11-12 DIAGNOSIS — L578 Other skin changes due to chronic exposure to nonionizing radiation: Secondary | ICD-10-CM | POA: Diagnosis not present

## 2017-01-25 DIAGNOSIS — H2513 Age-related nuclear cataract, bilateral: Secondary | ICD-10-CM | POA: Diagnosis not present

## 2017-01-25 DIAGNOSIS — H25013 Cortical age-related cataract, bilateral: Secondary | ICD-10-CM | POA: Diagnosis not present

## 2017-01-25 DIAGNOSIS — H40013 Open angle with borderline findings, low risk, bilateral: Secondary | ICD-10-CM | POA: Diagnosis not present

## 2017-01-25 DIAGNOSIS — H40053 Ocular hypertension, bilateral: Secondary | ICD-10-CM | POA: Diagnosis not present

## 2017-02-10 ENCOUNTER — Telehealth: Payer: Self-pay

## 2017-02-10 NOTE — Telephone Encounter (Signed)
I spoke with the patient who confirmed that Dr. Lavone Orn is his PCP.  He stated that his mom is at [facility] and has [employer].  He's her POA. Duane Lope ([place])

## 2017-04-16 DIAGNOSIS — Z1389 Encounter for screening for other disorder: Secondary | ICD-10-CM | POA: Diagnosis not present

## 2017-04-16 DIAGNOSIS — E7800 Pure hypercholesterolemia, unspecified: Secondary | ICD-10-CM | POA: Diagnosis not present

## 2017-04-16 DIAGNOSIS — Z0000 Encounter for general adult medical examination without abnormal findings: Secondary | ICD-10-CM | POA: Diagnosis not present

## 2017-04-16 DIAGNOSIS — I2510 Atherosclerotic heart disease of native coronary artery without angina pectoris: Secondary | ICD-10-CM | POA: Diagnosis not present

## 2017-04-16 DIAGNOSIS — I10 Essential (primary) hypertension: Secondary | ICD-10-CM | POA: Diagnosis not present

## 2017-06-10 ENCOUNTER — Other Ambulatory Visit: Payer: Self-pay | Admitting: Internal Medicine

## 2017-06-10 DIAGNOSIS — F17211 Nicotine dependence, cigarettes, in remission: Secondary | ICD-10-CM | POA: Diagnosis not present

## 2017-06-10 DIAGNOSIS — Z87891 Personal history of nicotine dependence: Secondary | ICD-10-CM

## 2017-06-15 ENCOUNTER — Ambulatory Visit
Admission: RE | Admit: 2017-06-15 | Discharge: 2017-06-15 | Disposition: A | Discharge status: Home / Self Care | Payer: Medicare Other | Source: Ambulatory Visit | Attending: Internal Medicine | Admitting: Internal Medicine

## 2017-06-15 DIAGNOSIS — Z87891 Personal history of nicotine dependence: Secondary | ICD-10-CM | POA: Diagnosis not present

## 2017-07-15 ENCOUNTER — Other Ambulatory Visit: Payer: Medicare Other | Admitting: *Deleted

## 2017-07-15 DIAGNOSIS — Z23 Encounter for immunization: Secondary | ICD-10-CM | POA: Diagnosis not present

## 2017-07-15 DIAGNOSIS — I2510 Atherosclerotic heart disease of native coronary artery without angina pectoris: Secondary | ICD-10-CM | POA: Diagnosis not present

## 2017-07-15 DIAGNOSIS — I10 Essential (primary) hypertension: Secondary | ICD-10-CM

## 2017-07-15 DIAGNOSIS — E7800 Pure hypercholesterolemia, unspecified: Secondary | ICD-10-CM

## 2017-07-15 LAB — LIPID PANEL
CHOL/HDL RATIO: 1.9 ratio (ref 0.0–5.0)
Cholesterol, Total: 129 mg/dL (ref 100–199)
HDL: 68 mg/dL (ref >39)
LDL CALC: 40 mg/dL (ref 0–99)
TRIGLYCERIDES: 104 mg/dL (ref 0–149)
VLDL Cholesterol Cal: 21 mg/dL (ref 5–40)

## 2017-07-15 LAB — COMPREHENSIVE METABOLIC PANEL
A/G RATIO: 1.7 (ref 1.2–2.2)
ALT: 28 IU/L (ref 0–44)
AST: 26 IU/L (ref 0–40)
Albumin: 4.3 g/dL (ref 3.6–4.8)
Alkaline Phosphatase: 74 IU/L (ref 39–117)
BUN/Creatinine Ratio: 14 (ref 10–24)
BUN: 13 mg/dL (ref 8–27)
Bilirubin Total: 1 mg/dL (ref 0.0–1.2)
CALCIUM: 9.6 mg/dL (ref 8.6–10.2)
CO2: 22 mmol/L (ref 20–29)
CREATININE: 0.92 mg/dL (ref 0.76–1.27)
Chloride: 95 mmol/L — ABNORMAL LOW (ref 96–106)
GFR calc Af Amer: 98 mL/min/{1.73_m2} (ref >59)
GFR, EST NON AFRICAN AMERICAN: 85 mL/min/{1.73_m2} (ref >59)
Globulin, Total: 2.5 g/dL (ref 1.5–4.5)
Glucose: 97 mg/dL (ref 65–99)
Potassium: 4.8 mmol/L (ref 3.5–5.2)
SODIUM: 137 mmol/L (ref 134–144)
Total Protein: 6.8 g/dL (ref 6.0–8.5)

## 2017-07-28 NOTE — Progress Notes (Signed)
Cardiology Office Note   Date:  07/29/2017   ID:  Craig Knapp, DOB 1945/12/02, MRN [id number]  PCP:  Lavone Orn, MD    No chief complaint on file. CAD   Wt Readings from Last 3 Encounters:  07/29/17 228 lb (103.4 kg)  07/15/16 221 lb (100.2 kg)  07/16/15 225 lb 12.8 oz (102.4 kg)       History of Present Illness: Craig Knapp is a 71 y.o. male  who had CABG in 2006. SHOB was his primary symptom along with tightness in his chest.  He had a lot of sweating. He never had an MI. Quit smoking after surgery. He uses some chewing tobacco still.  He continues to have osteomyelitis in his right tibia.  He has venous insufficiency and has compression stockings to use.  He had a screening chest CT scan because of a h/o smoking.  It was essentially negative per his report.  Denies : Chest pain. Dizziness. Leg edema. Nitroglycerin use. Orthopnea. Palpitations. Paroxysmal nocturnal dyspnea. Shortness of breath. Syncope.   He does not walk regularly.  He is limited by hip and back pain.        Past Medical History:  Diagnosis Date  . Coronary atherosclerosis of native coronary artery    CABG 2006, EDMUNDS  . DJD (degenerative joint disease) `   back and knees  . Essential hypertension, benign   . History of nuclear stress test    11/08 7 mets, no ischemia, EF 73%  . Male hypogonadism   . Osteomyelitis ([place])    R tibia 1972, recur 2005  . Other and unspecified hyperlipidemia     Past Surgical History:  Procedure Laterality Date  . CORONARY ARTERY BYPASS GRAFT  2006     Current Outpatient Prescriptions  Medication Sig Dispense Refill  . aspirin 81 MG tablet Take 81 mg by mouth daily.    Marland Kitchen atorvastatin (LIPITOR) 80 MG tablet TAKE 1 TABLET BY MOUTH DAILY. 90 tablet 3  . fluticasone (FLONASE) 50 MCG/ACT nasal spray Place into both nostrils as needed for allergies or rhinitis.    Marland Kitchen lisinopril-hydrochlorothiazide (PRINZIDE,ZESTORETIC) 20-12.5 MG tablet TAKE 2  TABLETS BY MOUTH DAILY. 180 tablet 3  . loratadine (CLARITIN) 10 MG tablet Take 10 mg by mouth daily.    . metoprolol tartrate (LOPRESSOR) 25 MG tablet TAKE 1 TABLET BY MOUTH TWICE A DAY 180 tablet 3  . Multiple Vitamins tablet Take 1 tablet by mouth daily.     . naproxen sodium (ANAPROX) 220 MG tablet Take 220 mg by mouth as needed (PAIN).     . Omega-3 Fatty Acids (FISH OIL) 1000 MG CAPS Take 2 capsules by mouth daily.       Current Facility-Administered Medications  Medication Dose Route Frequency Provider Last Rate Last Dose  . triamcinolone acetonide (KENALOG) 10 MG/ML injection 10 mg  10 mg Other Once Wallene Huh, DPM        Allergies:   Patient has no known allergies.    Social History:  The patient  reports that he has never smoked. His smokeless tobacco use includes Snuff. He reports that he drinks about 4.2 oz of alcohol per week . He reports that he does not use drugs.   Family History:  The patient's family history includes Alcoholism in his father; Heart disease in his mother; Lung cancer in his mother.    ROS:  Please see the history of present illness.   Otherwise, review of systems are  positive for chronic right leg wound.   All other systems are reviewed and negative.    PHYSICAL EXAM: VS:  BP 128/60   Pulse (!) 57   Ht 5\' 8"  (1.727 m)   Wt 228 lb (103.4 kg)   SpO2 97%   BMI 34.67 kg/m  , BMI Body mass index is 34.67 kg/m. GEN: Well nourished, well developed, in no acute distress  HEENT: normal  Neck: no JVD, carotid bruits, or masses Cardiac: RRR; no murmurs, rubs, or gallops,no edema  Respiratory:  clear to auscultation bilaterally, normal work of breathing GI: soft, nontender, nondistended, + BS MS: no deformity or atrophy ; dressing on the right shin Skin: warm and dry, no rash Neuro:  Strength and sensation are intact Psych: euthymic mood, full affect   EKG:   The ekg ordered today demonstrates NSR, RBBB, LAFB   Recent Labs: 07/15/2017: ALT  28; BUN 13; Creatinine, Ser 0.92; Potassium 4.8; Sodium 137   Lipid Panel    Component Value Date/Time   CHOL 129 07/15/2017 0749   TRIG 104 07/15/2017 0749   HDL 68 07/15/2017 0749   CHOLHDL 1.9 07/15/2017 0749   CHOLHDL 1.4 06/24/2016 0738   VLDL 19 06/24/2016 0738   LDLCALC 40 07/15/2017 0749     Other studies Reviewed: Additional studies/ records that were reviewed today with results demonstrating: We discussed his chest CT screening for lung cancer, in detail.  Aortic atherosclerosis mentioned.   ASSESSMENT AND PLAN:  1. CAD: No angina on medical management.  Continue medical therapy. 2. HTN: BP controlled.  Continue current meds.   3. Hyperlipidemia: Continue lipids lowering therapy.  LDL 40 at last check in 2018. 4. Obesity: Try to increase exercise to lose weight.  He can break up his exercise into 10 minute increments to allow joints to feel ok. 5. Aortic atherosclerosis: Continue aggressive secondary prevention. This was noted from his chest CT screening for lung cancer.  No AAA in 2016.   Current medicines are reviewed at length with the patient today.  The patient concerns regarding his medicines were addressed.  The following changes have been made:  No change  Labs/ tests ordered today include:  No orders of the defined types were placed in this encounter.   Recommend 150 minutes/week of aerobic exercise Low fat, low carb, high fiber diet recommended  Disposition:   FU in 1 year   Signed, Larae Grooms, MD  07/29/2017 11:05 AM    [facility] [place], [place], [place]  [address] Phone: [phone]; Fax: [phone]

## 2017-07-29 ENCOUNTER — Encounter: Payer: Self-pay | Admitting: Interventional Cardiology

## 2017-07-29 ENCOUNTER — Ambulatory Visit (INDEPENDENT_AMBULATORY_CARE_PROVIDER_SITE_OTHER): Payer: Medicare Other | Admitting: Interventional Cardiology

## 2017-07-29 VITALS — BP 128/60 | HR 57 | Ht 68.0 in | Wt 228.0 lb

## 2017-07-29 DIAGNOSIS — I10 Essential (primary) hypertension: Secondary | ICD-10-CM

## 2017-07-29 DIAGNOSIS — E782 Mixed hyperlipidemia: Secondary | ICD-10-CM

## 2017-07-29 DIAGNOSIS — I209 Angina pectoris, unspecified: Secondary | ICD-10-CM

## 2017-07-29 DIAGNOSIS — I25119 Atherosclerotic heart disease of native coronary artery with unspecified angina pectoris: Secondary | ICD-10-CM | POA: Diagnosis not present

## 2017-07-29 DIAGNOSIS — I700 Atherosclerosis of aorta: Secondary | ICD-10-CM | POA: Diagnosis not present

## 2017-07-29 NOTE — Patient Instructions (Signed)
Medication Instructions:  Your physician recommends that you continue on your current medications as directed. Please refer to the Current Medication list given to you today.   Labwork: None ordered  Testing/Procedures: None ordered  Follow-Up: Your physician wants you to follow-up in: 1 year with Dr. Irish Lack. You will receive a reminder letter in the mail two months in advance. If you don't receive a letter, please call our office to schedule the follow-up appointment.   Any Other Special Instructions Will Be Listed Below (If Applicable).   DASH Eating Plan DASH stands for "Dietary Approaches to Stop Hypertension." The DASH eating plan is a healthy eating plan that has been shown to reduce high blood pressure (hypertension). It may also reduce your risk for type 2 diabetes, heart disease, and stroke. The DASH eating plan may also help with weight loss. What are tips for following this plan? General guidelines  Avoid eating more than 2,300 mg (milligrams) of salt (sodium) a day. If you have hypertension, you may need to reduce your sodium intake to 1,500 mg a day.  Limit alcohol intake to no more than 1 drink a day for nonpregnant women and 2 drinks a day for men. One drink equals 12 oz of beer, 5 oz of wine, or 1 oz of hard liquor.  Work with your health care provider to maintain a healthy body weight or to lose weight. Ask what an ideal weight is for you.  Get at least 30 minutes of exercise that causes your heart to beat faster (aerobic exercise) most days of the week. Activities may include walking, swimming, or biking.  Work with your health care provider or diet and nutrition specialist (dietitian) to adjust your eating plan to your individual calorie needs. Reading food labels  Check food labels for the amount of sodium per serving. Choose foods with less than 5 percent of the Daily Value of sodium. Generally, foods with less than 300 mg of sodium per serving fit into this  eating plan.  To find whole grains, look for the word "whole" as the first word in the ingredient list. Shopping  Buy products labeled as "low-sodium" or "no salt added."  Buy fresh foods. Avoid canned foods and premade or frozen meals. Cooking  Avoid adding salt when cooking. Use salt-free seasonings or herbs instead of table salt or sea salt. Check with your health care provider or pharmacist before using salt substitutes.  Do not fry foods. Cook foods using healthy methods such as baking, boiling, grilling, and broiling instead.  Cook with heart-healthy oils, such as olive, canola, soybean, or sunflower oil. Meal planning   Eat a balanced diet that includes: ? 5 or more servings of fruits and vegetables each day. At each meal, try to fill half of your plate with fruits and vegetables. ? Up to 6-8 servings of whole grains each day. ? Less than 6 oz of lean meat, poultry, or fish each day. A 3-oz serving of meat is about the same size as a deck of cards. One egg equals 1 oz. ? 2 servings of low-fat dairy each day. ? A serving of nuts, seeds, or beans 5 times each week. ? Heart-healthy fats. Healthy fats called Omega-3 fatty acids are found in foods such as flaxseeds and coldwater fish, like sardines, salmon, and mackerel.  Limit how much you eat of the following: ? Canned or prepackaged foods. ? Food that is high in trans fat, such as fried foods. ? Food that is high  in saturated fat, such as fatty meat. ? Sweets, desserts, sugary drinks, and other foods with added sugar. ? Full-fat dairy products.  Do not salt foods before eating.  Try to eat at least 2 vegetarian meals each week.  Eat more home-cooked food and less restaurant, buffet, and fast food.  When eating at a restaurant, ask that your food be prepared with less salt or no salt, if possible. What foods are recommended? The items listed may not be a complete list. Talk with your dietitian about what dietary choices  are best for you. Grains Whole-grain or whole-wheat bread. Whole-grain or whole-wheat pasta. Brown rice. [place]. Bulgur. Whole-grain and low-sodium cereals. Pita bread. Low-fat, low-sodium crackers. Whole-wheat flour tortillas. Vegetables Fresh or frozen vegetables (raw, steamed, roasted, or grilled). Low-sodium or reduced-sodium tomato and vegetable juice. Low-sodium or reduced-sodium tomato sauce and tomato paste. Low-sodium or reduced-sodium canned vegetables. Fruits All fresh, dried, or frozen fruit. Canned fruit in natural juice (without added sugar). Meat and other protein foods Skinless chicken or [place]. Ground chicken or [place]. Pork with fat trimmed off. Fish and seafood. Egg whites. Dried beans, peas, or lentils. Unsalted nuts, nut butters, and seeds. Unsalted canned beans. Lean cuts of beef with fat trimmed off. Low-sodium, lean deli meat. Dairy Low-fat (1%) or fat-free (skim) milk. Fat-free, low-fat, or reduced-fat cheeses. Nonfat, low-sodium ricotta or cottage cheese. Low-fat or nonfat yogurt. Low-fat, low-sodium cheese. Fats and oils Soft margarine without trans fats. Vegetable oil. Low-fat, reduced-fat, or light mayonnaise and salad dressings (reduced-sodium). Canola, safflower, olive, soybean, and sunflower oils. Avocado. Seasoning and other foods Herbs. Spices. Seasoning mixes without salt. Unsalted popcorn and pretzels. Fat-free sweets. What foods are not recommended? The items listed may not be a complete list. Talk with your dietitian about what dietary choices are best for you. Grains Baked goods made with fat, such as croissants, muffins, or some breads. Dry pasta or rice meal packs. Vegetables Creamed or fried vegetables. Vegetables in a cheese sauce. Regular canned vegetables (not low-sodium or reduced-sodium). Regular canned tomato sauce and paste (not low-sodium or reduced-sodium). Regular tomato and vegetable juice (not low-sodium or reduced-sodium). Angie Fava.  Olives. Fruits Canned fruit in a light or heavy syrup. Fried fruit. Fruit in cream or butter sauce. Meat and other protein foods Fatty cuts of meat. Ribs. Fried meat. Berniece Salines. Sausage. Bologna and other processed lunch meats. Salami. Fatback. Hotdogs. Bratwurst. Salted nuts and seeds. Canned beans with added salt. Canned or smoked fish. Whole eggs or egg yolks. Chicken or [place] with skin. Dairy Whole or 2% milk, cream, and half-and-half. Whole or full-fat cream cheese. Whole-fat or sweetened yogurt. Full-fat cheese. Nondairy creamers. Whipped toppings. Processed cheese and cheese spreads. Fats and oils Butter. Stick margarine. Lard. Shortening. Ghee. Bacon fat. Tropical oils, such as coconut, palm kernel, or palm oil. Seasoning and other foods Salted popcorn and pretzels. Onion salt, garlic salt, seasoned salt, table salt, and sea salt. Worcestershire sauce. Tartar sauce. Barbecue sauce. Teriyaki sauce. Soy sauce, including reduced-sodium. Steak sauce. Canned and packaged gravies. Fish sauce. Oyster sauce. Cocktail sauce. Horseradish that you find on the shelf. Ketchup. Mustard. Meat flavorings and tenderizers. Bouillon cubes. Hot sauce and Tabasco sauce. Premade or packaged marinades. Premade or packaged taco seasonings. Relishes. Regular salad dressings. Where to find more information:  National Heart, Lung, and [place]: https://wilson-eaton.com/  American Heart Association: www.heart.org Summary  The DASH eating plan is a healthy eating plan that has been shown to reduce high blood pressure (hypertension). It may  also reduce your risk for type 2 diabetes, heart disease, and stroke.  With the DASH eating plan, you should limit salt (sodium) intake to 2,300 mg a day. If you have hypertension, you may need to reduce your sodium intake to 1,500 mg a day.  When on the DASH eating plan, aim to eat more fresh fruits and vegetables, whole grains, lean proteins, low-fat dairy, and heart-healthy  fats.  Work with your health care provider or diet and nutrition specialist (dietitian) to adjust your eating plan to your individual calorie needs. This information is not intended to replace advice given to you by your health care provider. Make sure you discuss any questions you have with your health care provider. Document Released: 09/24/2011 Document Revised: 09/28/2016 Document Reviewed: 09/28/2016 Elsevier Interactive Patient Education  2017 [employer].    If you need a refill on your cardiac medications before your next appointment, please call your pharmacy.

## 2017-09-21 ENCOUNTER — Other Ambulatory Visit: Payer: Self-pay | Admitting: Interventional Cardiology
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
## 2017-11-05 ENCOUNTER — Ambulatory Visit (INDEPENDENT_AMBULATORY_CARE_PROVIDER_SITE_OTHER): Payer: Medicare Other | Admitting: Interventional Cardiology

## 2017-11-05 ENCOUNTER — Encounter: Payer: Self-pay | Admitting: Interventional Cardiology

## 2017-11-05 VITALS — BP 122/58 | HR 60 | Ht 68.5 in | Wt 230.0 lb

## 2017-11-05 DIAGNOSIS — I209 Angina pectoris, unspecified: Secondary | ICD-10-CM | POA: Diagnosis not present

## 2017-11-05 DIAGNOSIS — I10 Essential (primary) hypertension: Secondary | ICD-10-CM | POA: Diagnosis not present

## 2017-11-05 DIAGNOSIS — I25118 Atherosclerotic heart disease of native coronary artery with other forms of angina pectoris: Secondary | ICD-10-CM | POA: Diagnosis not present

## 2017-11-05 DIAGNOSIS — E782 Mixed hyperlipidemia: Secondary | ICD-10-CM

## 2017-11-05 DIAGNOSIS — R0789 Other chest pain: Secondary | ICD-10-CM

## 2017-11-05 DIAGNOSIS — E669 Obesity, unspecified: Secondary | ICD-10-CM | POA: Diagnosis not present

## 2017-11-05 NOTE — Patient Instructions (Signed)
Medication Instructions:  Your physician recommends that you continue on your current medications as directed. Please refer to the Current Medication list given to you today.   Labwork: None ordered  Testing/Procedures: None ordered  Follow-Up: Your physician wants you to follow-up in: October 2019 with Dr. Irish Lack. You will receive a reminder letter in the mail two months in advance. If you don't receive a letter, please call our office to schedule the follow-up appointment.   Any Other Special Instructions Will Be Listed Below (If Applicable).     If you need a refill on your cardiac medications before your next appointment, please call your pharmacy.

## 2017-11-11 DIAGNOSIS — H40012 Open angle with borderline findings, low risk, left eye: Secondary | ICD-10-CM | POA: Diagnosis not present

## 2017-11-11 DIAGNOSIS — H40053 Ocular hypertension, bilateral: Secondary | ICD-10-CM | POA: Diagnosis not present

## 2017-11-11 DIAGNOSIS — H40021 Open angle with borderline findings, high risk, right eye: Secondary | ICD-10-CM | POA: Diagnosis not present

## 2017-11-11 DIAGNOSIS — H11153 Pinguecula, bilateral: Secondary | ICD-10-CM | POA: Diagnosis not present

## 2017-11-11 DIAGNOSIS — H2513 Age-related nuclear cataract, bilateral: Secondary | ICD-10-CM | POA: Diagnosis not present

## 2017-11-11 DIAGNOSIS — H25013 Cortical age-related cataract, bilateral: Secondary | ICD-10-CM | POA: Diagnosis not present

## 2017-12-07 ENCOUNTER — Ambulatory Visit
Admission: RE | Admit: 2017-12-07 | Discharge: 2017-12-07 | Disposition: A | Discharge status: Home / Self Care | Payer: Medicare Other | Source: Ambulatory Visit | Attending: Internal Medicine | Admitting: Internal Medicine

## 2017-12-07 ENCOUNTER — Encounter: Payer: Self-pay | Admitting: Internal Medicine

## 2017-12-07 ENCOUNTER — Ambulatory Visit (INDEPENDENT_AMBULATORY_CARE_PROVIDER_SITE_OTHER): Payer: Medicare Other | Admitting: Internal Medicine

## 2017-12-07 DIAGNOSIS — M86171 Other acute osteomyelitis, right ankle and foot: Secondary | ICD-10-CM | POA: Diagnosis not present

## 2017-12-07 DIAGNOSIS — M86661 Other chronic osteomyelitis, right tibia and fibula: Secondary | ICD-10-CM

## 2017-12-07 DIAGNOSIS — I25118 Atherosclerotic heart disease of native coronary artery with other forms of angina pectoris: Secondary | ICD-10-CM
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
## 2017-12-30 ENCOUNTER — Ambulatory Visit (INDEPENDENT_AMBULATORY_CARE_PROVIDER_SITE_OTHER): Payer: Medicare Other | Admitting: Internal Medicine

## 2017-12-30 ENCOUNTER — Encounter: Payer: Self-pay | Admitting: Internal Medicine

## 2017-12-30 DIAGNOSIS — I25118 Atherosclerotic heart disease of native coronary artery with other forms of angina pectoris: Secondary | ICD-10-CM

## 2017-12-30 DIAGNOSIS — M86661 Other chronic osteomyelitis, right tibia and fibula: Secondary | ICD-10-CM | POA: Diagnosis present
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
## 2017-12-30 NOTE — Assessment & Plan Note (Signed)
There has been no change in his very chronic right tibial osteomyelitis.  He has a chronic bone island with a sinus tract.  It is very unlikely that anything short of BKA would ever be curative.  Previous attempts to culture a causative organism have been negative.  I do not think that another attempt at chronic suppressive antibiotic therapy is likely to offer significant benefit.  He is in agreement with simply continuing daily dressing changes.  He knows he can follow-up here as needed.

## 2018-01-25 DIAGNOSIS — H2513 Age-related nuclear cataract, bilateral: Secondary | ICD-10-CM | POA: Diagnosis not present

## 2018-01-25 DIAGNOSIS — H35033 Hypertensive retinopathy, bilateral: Secondary | ICD-10-CM | POA: Diagnosis not present

## 2018-02-14 DIAGNOSIS — H2513 Age-related nuclear cataract, bilateral: Secondary | ICD-10-CM | POA: Diagnosis not present

## 2018-02-14 DIAGNOSIS — H40013 Open angle with borderline findings, low risk, bilateral: Secondary | ICD-10-CM | POA: Diagnosis not present

## 2018-02-14 DIAGNOSIS — H2512 Age-related nuclear cataract, left eye: Secondary | ICD-10-CM | POA: Diagnosis not present

## 2018-04-07 DIAGNOSIS — H2512 Age-related nuclear cataract, left eye: Secondary | ICD-10-CM | POA: Diagnosis not present

## 2018-04-07 DIAGNOSIS — H2513 Age-related nuclear cataract, bilateral: Secondary | ICD-10-CM | POA: Diagnosis not present

## 2018-04-08 DIAGNOSIS — H2511 Age-related nuclear cataract, right eye: Secondary | ICD-10-CM | POA: Diagnosis not present

## 2018-04-18 ENCOUNTER — Other Ambulatory Visit: Payer: Self-pay | Admitting: Internal Medicine

## 2018-04-18 DIAGNOSIS — F17211 Nicotine dependence, cigarettes, in remission: Secondary | ICD-10-CM

## 2018-04-18 DIAGNOSIS — R109 Unspecified abdominal pain: Secondary | ICD-10-CM | POA: Diagnosis not present

## 2018-04-18 DIAGNOSIS — I10 Essential (primary) hypertension: Secondary | ICD-10-CM | POA: Diagnosis not present

## 2018-04-18 DIAGNOSIS — Z122 Encounter for screening for malignant neoplasm of respiratory organs: Secondary | ICD-10-CM | POA: Diagnosis not present

## 2018-04-18 DIAGNOSIS — I2510 Atherosclerotic heart disease of native coronary artery without angina pectoris: Secondary | ICD-10-CM | POA: Diagnosis not present

## 2018-04-18 DIAGNOSIS — Z1389 Encounter for screening for other disorder: Secondary | ICD-10-CM | POA: Diagnosis not present

## 2018-04-18 DIAGNOSIS — Z125 Encounter for screening for malignant neoplasm of prostate: Secondary | ICD-10-CM | POA: Diagnosis not present

## 2018-04-18 DIAGNOSIS — Z0000 Encounter for general adult medical examination without abnormal findings: Secondary | ICD-10-CM | POA: Diagnosis not present

## 2018-05-03 ENCOUNTER — Ambulatory Visit
Admission: RE | Admit: 2018-05-03 | Discharge: 2018-05-03 | Disposition: A | Discharge status: Home / Self Care | Payer: Medicare Other | Source: Ambulatory Visit | Attending: Internal Medicine | Admitting: Internal Medicine

## 2018-05-03 DIAGNOSIS — F17211 Nicotine dependence, cigarettes, in remission: Secondary | ICD-10-CM

## 2018-05-03 DIAGNOSIS — J439 Emphysema, unspecified: Secondary | ICD-10-CM | POA: Diagnosis not present

## 2018-05-05 DIAGNOSIS — H2511 Age-related nuclear cataract, right eye: Secondary | ICD-10-CM | POA: Diagnosis not present

## 2018-05-05 DIAGNOSIS — H2513 Age-related nuclear cataract, bilateral: Secondary | ICD-10-CM | POA: Diagnosis not present

## 2018-06-16 ENCOUNTER — Telehealth: Payer: Self-pay | Admitting: Interventional Cardiology

## 2018-06-16 DIAGNOSIS — E782 Mixed hyperlipidemia: Secondary | ICD-10-CM

## 2018-06-16 DIAGNOSIS — I25118 Atherosclerotic heart disease of native coronary artery with other forms of angina pectoris: Secondary | ICD-10-CM

## 2018-06-16 NOTE — Telephone Encounter (Signed)
Returned call to patient. Fasting LIPIDS and CMET ordered for 11/20 to be done prior to his appointment in December. Patient verbalized understanding and thanked me for the call.

## 2018-06-16 NOTE — Telephone Encounter (Signed)
New message   Patient would like labs ordered before his appt on 09/21/2018. Please advise.

## 2018-06-24 ENCOUNTER — Other Ambulatory Visit: Payer: Self-pay | Admitting: Interventional Cardiology

## 2018-07-07 DIAGNOSIS — L570 Actinic keratosis: Secondary | ICD-10-CM | POA: Diagnosis not present

## 2018-07-07 DIAGNOSIS — L814 Other melanin hyperpigmentation: Secondary | ICD-10-CM | POA: Diagnosis not present

## 2018-07-07 DIAGNOSIS — D485 Neoplasm of uncertain behavior of skin: Secondary | ICD-10-CM | POA: Diagnosis not present

## 2018-07-07 DIAGNOSIS — D1801 Hemangioma of skin and subcutaneous tissue: Secondary | ICD-10-CM | POA: Diagnosis not present

## 2018-07-07 DIAGNOSIS — D229 Melanocytic nevi, unspecified: Secondary | ICD-10-CM | POA: Diagnosis not present

## 2018-07-07 DIAGNOSIS — L821 Other seborrheic keratosis: Secondary | ICD-10-CM | POA: Diagnosis not present

## 2018-07-07 DIAGNOSIS — L819 Disorder of pigmentation, unspecified: Secondary | ICD-10-CM | POA: Diagnosis not present

## 2018-07-14 DIAGNOSIS — Z23 Encounter for immunization: Secondary | ICD-10-CM | POA: Diagnosis not present

## 2018-09-07 ENCOUNTER — Other Ambulatory Visit: Payer: Medicare Other

## 2018-09-12 ENCOUNTER — Other Ambulatory Visit: Payer: Medicare Other | Admitting: *Deleted

## 2018-09-12 DIAGNOSIS — I25118 Atherosclerotic heart disease of native coronary artery with other forms of angina pectoris: Secondary | ICD-10-CM | POA: Diagnosis not present

## 2018-09-12 DIAGNOSIS — E782 Mixed hyperlipidemia: Secondary | ICD-10-CM | POA: Diagnosis not present

## 2018-09-12 LAB — COMPREHENSIVE METABOLIC PANEL
A/G RATIO: 1.9 (ref 1.2–2.2)
ALBUMIN: 4.3 g/dL (ref 3.5–4.8)
ALT: 30 IU/L (ref 0–44)
AST: 26 IU/L (ref 0–40)
Alkaline Phosphatase: 77 IU/L (ref 39–117)
BILIRUBIN TOTAL: 1 mg/dL (ref 0.0–1.2)
BUN / CREAT RATIO: 17 (ref 10–24)
BUN: 18 mg/dL (ref 8–27)
CHLORIDE: 100 mmol/L (ref 96–106)
CO2: 22 mmol/L (ref 20–29)
Calcium: 9.5 mg/dL (ref 8.6–10.2)
Creatinine, Ser: 1.03 mg/dL (ref 0.76–1.27)
GFR calc non Af Amer: 73 mL/min/{1.73_m2} (ref >59)
GFR, EST AFRICAN AMERICAN: 84 mL/min/{1.73_m2} (ref >59)
GLOBULIN, TOTAL: 2.3 g/dL (ref 1.5–4.5)
Glucose: 103 mg/dL — ABNORMAL HIGH (ref 65–99)
POTASSIUM: 4.5 mmol/L (ref 3.5–5.2)
SODIUM: 139 mmol/L (ref 134–144)
TOTAL PROTEIN: 6.6 g/dL (ref 6.0–8.5)

## 2018-09-12 LAB — LIPID PANEL
Chol/HDL Ratio: 1.9 ratio (ref 0.0–5.0)
Cholesterol, Total: 138 mg/dL (ref 100–199)
HDL: 74 mg/dL (ref >39)
LDL Calculated: 43 mg/dL (ref 0–99)
Triglycerides: 105 mg/dL (ref 0–149)
VLDL Cholesterol Cal: 21 mg/dL (ref 5–40)

## 2018-09-20 NOTE — Progress Notes (Signed)
Cardiology Office Note   Date:  09/21/2018   ID:  Craig Knapp, DOB [birth year], MRN [id number]  PCP:  Lavone Orn, MD    No chief complaint on file.  CAD  Wt Readings from Last 3 Encounters:  09/21/18 226 lb 9.6 oz (102.8 kg)  12/30/17 230 lb (104.3 kg)  12/07/17 228 lb (103.4 kg)       History of Present Illness: Craig Knapp is a 72 y.o. male  who had CABG in 2006. SHOB was his primary symptom along with tightnessin his chest. He had a lot of sweating. He never had an MI. Quit smoking after surgery. He used some chewing tobacco for a while.  2016 ultrasound showed no AAA.  He also had a wound on his shin. He had an ultrasound at [facility] to check circulation. ..since accident many years ago.  2015 MRI at [facility] showed: 02/19/14 MRI Tib/fib Results: (to be scanned in under Media) "There is a rim enhancing cavity in the anterior aspect of the mid right tibia measuring 2.9 x 2.4 x 2 cm with a fistula to the skin surface through a defect in the anterior cortex of the tibia. There are at least 3 definable areas of low signal intensity within the cavity measuring between 4 and 6 mm which I suspect represent bony sequestra.  This is at an area of healed fracture of the mid tibial shaft. The patient dose have focal grade 4 chondromalacia of the tibiotalar joint.   IMPRESSION: -Focal osteomyelitis of the anterior aspect of the mid right tibia with bony sequestra and a communicating fistula to the skin surface."  Wound care has been done.    in Jan 2019, he had some soreness in his chest.  It was slightly different than the usual chest pain that he has had since the surgery.  He had some pain under his left arm as well.  It lasted a few hours and resolved after taking a BC powder.  This was different from the angina he had before his CABG.  Since the last visit, no further soreness in the chest.  BP has been higher in the last week, due to head cold.  Denies : Chest  pain. Dizziness. Leg edema. Nitroglycerin use. Orthopnea. Palpitations. Paroxysmal nocturnal dyspnea. Syncope.  Back pain limits exercise.  Chronic DOE.   He feels his weight has been stable.   He has not been checking BP as much at home.  Usual readings are in the 921J systolic.    Past Medical History:  Diagnosis Date  . Coronary atherosclerosis of native coronary artery    CABG 2006, EDMUNDS  . DJD (degenerative joint disease) `   back and knees  . Essential hypertension, benign   . History of nuclear stress test    11/08 7 mets, no ischemia, EF 73%  . Male hypogonadism   . Osteomyelitis ([place])    R tibia 1972, recur 2005  . Other and unspecified hyperlipidemia     Past Surgical History:  Procedure Laterality Date  . CORONARY ARTERY BYPASS GRAFT  2006     Current Outpatient Medications  Medication Sig Dispense Refill  . aspirin 81 MG tablet Take 81 mg by mouth daily.    Marland Kitchen atorvastatin (LIPITOR) 80 MG tablet TAKE ONE TABLET BY MOUTH DAILY 90 tablet 0  . fluticasone (FLONASE) 50 MCG/ACT nasal spray Place into both nostrils as needed for allergies or rhinitis.    Marland Kitchen lisinopril-hydrochlorothiazide (PRINZIDE,ZESTORETIC) 20-12.5 MG tablet TAKE  2 TABLETS BY MOUTH DAILY  180 tablet 0  . loratadine (CLARITIN) 10 MG tablet Take 10 mg by mouth daily.    . metoprolol tartrate (LOPRESSOR) 25 MG tablet TAKE ONE TABLET BY MOUTH TWICE DAILY 180 tablet 0  . naproxen sodium (ANAPROX) 220 MG tablet Take 220 mg by mouth as needed (PAIN).     . Omega-3 Fatty Acids (FISH OIL) 1000 MG CAPS Take 2 capsules by mouth daily.       No current facility-administered medications for this visit.     Allergies:   Patient has no known allergies.    Social History:  The patient  reports that he has never smoked. His smokeless tobacco use includes snuff. He reports that he drinks about 7.0 standard drinks of alcohol per week. He reports that he does not use drugs.   Family History:  The patient's family  history includes Alcoholism in his father; Heart disease in his mother; Lung cancer in his mother.    ROS:  Please see the history of present illness.   Otherwise, review of systems are positive for leg wound-chronic.   All other systems are reviewed and negative.    PHYSICAL EXAM: VS:  BP (!) 154/74   Pulse (!) 57   Ht 5\' 8"  (1.727 m)   Wt 226 lb 9.6 oz (102.8 kg)   SpO2 97%   BMI 34.45 kg/m  , BMI Body mass index is 34.45 kg/m. GEN: Well nourished, well developed, in no acute distress  HEENT: normal  Neck: no JVD, carotid bruits, or masses Cardiac: RRR; no murmurs, rubs, or gallops,no edema  Respiratory:  clear to auscultation bilaterally, normal work of breathing GI: soft, nontender, nondistended, + BS, obese MS: no deformity or atrophy ; right leg larger, chronic Skin: warm and dry, no rash Neuro:  Strength and sensation are intact Psych: euthymic mood, full affect   EKG:   The ekg ordered today demonstrates SB, RBBB, LAFB   Recent Labs: 09/12/2018: ALT 30; BUN 18; Creatinine, Ser 1.03; Potassium 4.5; Sodium 139   Lipid Panel    Component Value Date/Time   CHOL 138 09/12/2018 0735   TRIG 105 09/12/2018 0735   HDL 74 09/12/2018 0735   CHOLHDL 1.9 09/12/2018 0735   CHOLHDL 1.4 06/24/2016 0738   VLDL 19 06/24/2016 0738   LDLCALC 43 09/12/2018 0735     Other studies Reviewed: Additional studies/ records that were reviewed today with results demonstrating: Chest CT in 7/19 benign- aortic atherosclerosis.   ASSESSMENT AND PLAN:  1. CAD: No angina on medications.  Continue aggressive secondary prevention.  No bleeding on aspirin.  Holding aspirin for a few days for tooth extraction. 2. HTN: The current medical regimen is effective;  continue present plan and medications.  Better readings at home.   3. Hyperlipidemia: LDL controlled as above. COntinue current meds.  4. Obesity: Decrease sugar intake.   5. Aortic atherosclerosis: Continue lipid lowering therapy.     Current medicines are reviewed at length with the patient today.  The patient concerns regarding his medicines were addressed.  The following changes have been made:  No change  Labs/ tests ordered today include:  No orders of the defined types were placed in this encounter.   Recommend 150 minutes/week of aerobic exercise Low fat, low carb, high fiber diet recommended  Disposition:   FU in 1 year   Signed, Larae Grooms, MD  09/21/2018 9:49 AM    [facility]  [address], [place], [place]  [address] Phone: [phone]; Fax: [phone]

## 2018-09-21 ENCOUNTER — Ambulatory Visit (INDEPENDENT_AMBULATORY_CARE_PROVIDER_SITE_OTHER): Payer: Medicare Other | Admitting: Interventional Cardiology

## 2018-09-21 ENCOUNTER — Encounter: Payer: Self-pay | Admitting: Interventional Cardiology

## 2018-09-21 VITALS — BP 154/74 | HR 57 | Ht 68.0 in | Wt 226.6 lb

## 2018-09-21 DIAGNOSIS — I10 Essential (primary) hypertension: Secondary | ICD-10-CM | POA: Diagnosis not present

## 2018-09-21 DIAGNOSIS — E782 Mixed hyperlipidemia: Secondary | ICD-10-CM

## 2018-09-21 DIAGNOSIS — I25118 Atherosclerotic heart disease of native coronary artery with other forms of angina pectoris: Secondary | ICD-10-CM

## 2018-09-21 DIAGNOSIS — E66811 Obesity, class 1: Secondary | ICD-10-CM

## 2018-09-21 DIAGNOSIS — I700 Atherosclerosis of aorta: Secondary | ICD-10-CM | POA: Diagnosis not present

## 2018-09-21 DIAGNOSIS — E669 Obesity, unspecified: Secondary | ICD-10-CM

## 2018-09-21 MED ORDER — METOPROLOL TARTRATE 25 MG PO TABS: 25.0000 mg | ORAL_TABLET | Freq: Two times a day (BID) | ORAL | 3 refills | Status: DC

## 2018-09-21 MED ORDER — LISINOPRIL-HYDROCHLOROTHIAZIDE 20-12.5 MG PO TABS: 2.0000 | ORAL_TABLET | Freq: Every day | ORAL | 3 refills | Status: DC

## 2018-09-21 MED ORDER — ATORVASTATIN CALCIUM 80 MG PO TABS: 80.0000 mg | ORAL_TABLET | Freq: Every day | ORAL | 3 refills | Status: DC

## 2018-09-21 NOTE — Addendum Note (Signed)
Addended by: Drue Novel I on: 09/21/2018 10:10 AM   Modules accepted: Orders

## 2018-09-21 NOTE — Patient Instructions (Signed)

## 2019-02-24 ENCOUNTER — Other Ambulatory Visit: Payer: Self-pay

## 2019-02-24 ENCOUNTER — Encounter (HOSPITAL_BASED_OUTPATIENT_CLINIC_OR_DEPARTMENT_OTHER): Payer: Medicare Other | Attending: Internal Medicine

## 2019-02-24 DIAGNOSIS — Z951 Presence of aortocoronary bypass graft: Secondary | ICD-10-CM | POA: Diagnosis not present

## 2019-02-24 DIAGNOSIS — Z87891 Personal history of nicotine dependence: Secondary | ICD-10-CM | POA: Diagnosis not present

## 2019-02-24 DIAGNOSIS — I10 Essential (primary) hypertension: Secondary | ICD-10-CM | POA: Insufficient documentation

## 2019-02-24 DIAGNOSIS — I2510 Atherosclerotic heart disease of native coronary artery without angina pectoris: Secondary | ICD-10-CM | POA: Insufficient documentation

## 2019-02-24 DIAGNOSIS — L231 Allergic contact dermatitis due to adhesives: Secondary | ICD-10-CM | POA: Insufficient documentation

## 2019-02-24 DIAGNOSIS — S81801A Unspecified open wound, right lower leg, initial encounter: Secondary | ICD-10-CM | POA: Diagnosis not present

## 2019-03-10 DIAGNOSIS — I10 Essential (primary) hypertension: Secondary | ICD-10-CM | POA: Diagnosis not present

## 2019-03-10 DIAGNOSIS — L231 Allergic contact dermatitis due to adhesives: Secondary | ICD-10-CM | POA: Diagnosis not present

## 2019-03-10 DIAGNOSIS — S81801A Unspecified open wound, right lower leg, initial encounter: Secondary | ICD-10-CM | POA: Diagnosis not present

## 2019-03-10 DIAGNOSIS — Z87891 Personal history of nicotine dependence: Secondary | ICD-10-CM | POA: Diagnosis not present

## 2019-03-10 DIAGNOSIS — I2510 Atherosclerotic heart disease of native coronary artery without angina pectoris: Secondary | ICD-10-CM | POA: Diagnosis not present

## 2019-03-10 DIAGNOSIS — Z951 Presence of aortocoronary bypass graft: Secondary | ICD-10-CM | POA: Diagnosis not present

## 2019-04-11 DIAGNOSIS — L570 Actinic keratosis: Secondary | ICD-10-CM | POA: Diagnosis not present

## 2019-04-11 DIAGNOSIS — L819 Disorder of pigmentation, unspecified: Secondary | ICD-10-CM | POA: Diagnosis not present

## 2019-04-11 DIAGNOSIS — L249 Irritant contact dermatitis, unspecified cause: Secondary | ICD-10-CM | POA: Diagnosis not present

## 2019-04-11 DIAGNOSIS — L718 Other rosacea: Secondary | ICD-10-CM | POA: Diagnosis not present

## 2019-04-24 DIAGNOSIS — Z1389 Encounter for screening for other disorder: Secondary | ICD-10-CM | POA: Diagnosis not present

## 2019-04-24 DIAGNOSIS — Z0000 Encounter for general adult medical examination without abnormal findings: Secondary | ICD-10-CM | POA: Diagnosis not present

## 2019-04-24 DIAGNOSIS — F17211 Nicotine dependence, cigarettes, in remission: Secondary | ICD-10-CM | POA: Diagnosis not present

## 2019-05-01 ENCOUNTER — Other Ambulatory Visit: Payer: Self-pay | Admitting: Internal Medicine

## 2019-05-01 DIAGNOSIS — Z09 Encounter for follow-up examination after completed treatment for conditions other than malignant neoplasm: Secondary | ICD-10-CM

## 2019-05-15 ENCOUNTER — Other Ambulatory Visit: Payer: Self-pay | Admitting: Internal Medicine

## 2019-05-15 DIAGNOSIS — Z87891 Personal history of nicotine dependence: Secondary | ICD-10-CM

## 2019-05-16 ENCOUNTER — Other Ambulatory Visit: Payer: Self-pay | Admitting: Internal Medicine

## 2019-05-16 ENCOUNTER — Ambulatory Visit
Admission: RE | Admit: 2019-05-16 | Discharge: 2019-05-16 | Disposition: A | Discharge status: Home / Self Care | Payer: Medicare Other | Source: Ambulatory Visit | Attending: Internal Medicine | Admitting: Internal Medicine

## 2019-05-16 DIAGNOSIS — Z87891 Personal history of nicotine dependence: Secondary | ICD-10-CM | POA: Diagnosis not present

## 2019-05-16 DIAGNOSIS — F17211 Nicotine dependence, cigarettes, in remission: Secondary | ICD-10-CM

## 2019-06-02 DIAGNOSIS — G8929 Other chronic pain: Secondary | ICD-10-CM | POA: Diagnosis not present

## 2019-06-02 DIAGNOSIS — I2510 Atherosclerotic heart disease of native coronary artery without angina pectoris: Secondary | ICD-10-CM | POA: Diagnosis not present

## 2019-06-02 DIAGNOSIS — M5442 Lumbago with sciatica, left side: Secondary | ICD-10-CM | POA: Diagnosis not present

## 2019-06-02 DIAGNOSIS — I10 Essential (primary) hypertension: Secondary | ICD-10-CM | POA: Diagnosis not present

## 2019-06-02 DIAGNOSIS — E7800 Pure hypercholesterolemia, unspecified: Secondary | ICD-10-CM | POA: Diagnosis not present

## 2019-06-02 DIAGNOSIS — M5441 Lumbago with sciatica, right side: Secondary | ICD-10-CM | POA: Diagnosis not present

## 2019-07-19 DIAGNOSIS — Z23 Encounter for immunization: Secondary | ICD-10-CM | POA: Diagnosis not present

## 2019-08-28 DIAGNOSIS — I10 Essential (primary) hypertension: Secondary | ICD-10-CM | POA: Diagnosis not present

## 2019-08-28 DIAGNOSIS — R079 Chest pain, unspecified: Secondary | ICD-10-CM | POA: Diagnosis not present

## 2019-08-29 ENCOUNTER — Telehealth: Payer: Self-pay | Admitting: Interventional Cardiology

## 2019-08-29 NOTE — Telephone Encounter (Signed)
I spoke to the patient who is due his 1 year f/u with Dr Irish Lack and yesterday 11/9, experienced elevated BP (195/70s) and right sided CP.  He went over to [facility] and they did EKG and evaluated him.  They advised him to take an extra Metoprolol to control BP.  He is feeling ok this morning, but was advised to go to the ED if symptoms or BP worsens.  I will try to get him in with Dr Irish Lack or APP for f/u.

## 2019-08-29 NOTE — Telephone Encounter (Signed)
New message   Pt c/o BP issue: STAT if pt c/o blurred vision, one-sided weakness or slurred speech  1. What are your last 5 BP readings? 197/78 191/70 170/65  2. Are you having any other symptoms (ex. Dizziness, headache, blurred vision, passed out)? Pain and discomfort in right upper chest, lightheaded  3. What is your BP issue?Patient's b/p is elevated

## 2019-08-30 NOTE — Progress Notes (Signed)
Cardiology Office Note    Date:  09/05/2019   ID:  Craig Knapp, DOB 1946-04-09, MRN [id number]  PCP:  Craig Orn, MD  Cardiologist: Craig Grooms, MD EPS: None  Chief Complaint  Patient presents with  . Hypertension    History of Present Illness:  Craig Knapp is a 73 y.o. male with history of CAD status post CABG in 2006, hypertension HLD, obesity  Last office visit with Dr. Irish Knapp 09/2018 was doing well.  Patient has had elevated blood pressures recently as well as right-sided chest pain.  Evaluated at [facility] and did an EKG and advised him to take extra metoprolol. Has had some acid reflux. Was driving to [place] and had right sided chest pain-pin prick, poking sensation so went home and BP was very high. Has been eating out breakfast and dinner daily.  DOE that is chronic has gotten worse over the past couple of months. Has had low back pain and sciatica so not active over the past year.  Past Medical History:  Diagnosis Date  . Coronary atherosclerosis of native coronary artery    CABG 2006, EDMUNDS  . DJD (degenerative joint disease) `   back and knees  . Essential hypertension, benign   . History of nuclear stress test    11/08 7 mets, no ischemia, EF 73%  . Male hypogonadism   . Osteomyelitis ([place])    R tibia 1972, recur 2005  . Other and unspecified hyperlipidemia     Past Surgical History:  Procedure Laterality Date  . CORONARY ARTERY BYPASS GRAFT  2006    Current Medications: Current Meds  Medication Sig  . aspirin 81 MG tablet Take 81 mg by mouth daily.  Marland Kitchen atorvastatin (LIPITOR) 80 MG tablet Take 1 tablet (80 mg total) by mouth daily.  . fluticasone (FLONASE) 50 MCG/ACT nasal spray Place into both nostrils as needed for allergies or rhinitis.  Marland Kitchen loratadine (CLARITIN) 10 MG tablet Take 10 mg by mouth daily.  . metoprolol tartrate (LOPRESSOR) 25 MG tablet Take 1 tablet (25 mg total) by mouth 2 (two) times daily.  .  naproxen sodium (ANAPROX) 220 MG tablet Take 220 mg by mouth as needed (PAIN).   . Omega-3 Fatty Acids (FISH OIL) 1000 MG CAPS Take 2 capsules by mouth daily.    . [DISCONTINUED] atorvastatin (LIPITOR) 80 MG tablet Take 1 tablet (80 mg total) by mouth daily.  . [DISCONTINUED] hydrochlorothiazide (MICROZIDE) 12.5 MG capsule Take 12.5 mg by mouth 3 (three) times daily.  . [DISCONTINUED] lisinopril (ZESTRIL) 20 MG tablet Take 20 mg by mouth 3 (three) times daily.  . [DISCONTINUED] metoprolol tartrate (LOPRESSOR) 25 MG tablet Take 1 tablet (25 mg total) by mouth 2 (two) times daily.     Allergies:   Patient has no known allergies.   Social History   Socioeconomic History  . Marital status: Divorced    Spouse name: Not on file  . Number of children: Not on file  . Years of education: Not on file  . Highest education level: Not on file  Occupational History  . Not on file  Social Needs  . Financial resource strain: Not on file  . Food insecurity    Worry: Not on file    Inability: Not on file  . Transportation needs    Medical: Not on file    Non-medical: Not on file  Tobacco Use  . Smoking status: Never Smoker  . Smokeless tobacco: Current User    Types:  Snuff  Substance and Sexual Activity  . Alcohol use: Yes    Alcohol/week: 7.0 standard drinks    Types: 7 Standard drinks or equivalent per week    Comment: daily in afternoons  . Drug use: No  . Sexual activity: Not on file  Lifestyle  . Physical activity    Days per week: Not on file    Minutes per session: Not on file  . Stress: Not on file  Relationships  . Social Herbalist on phone: Not on file    Gets together: Not on file    Attends religious service: Not on file    Active member of club or organization: Not on file    Attends meetings of clubs or organizations: Not on file    Relationship status: Not on file  Other Topics Concern  . Not on file  Social History Narrative  . Not on file     Family  History:  The patient's family history includes Alcoholism in his father; Heart disease in his mother; Lung cancer in his mother.   ROS:   Please see the history of present illness.    ROS All other systems reviewed and are negative.   PHYSICAL EXAM:   VS:  BP (!) 160/84   Pulse 61   Ht 5\' 8"  (1.727 m)   Wt 232 lb 6.4 oz (105.4 kg)   SpO2 96%   BMI 35.34 kg/m   Physical Exam  GEN: Obese, in no acute distress  Neck: no JVD, carotid bruits, or masses Cardiac:RRR; no murmurs, rubs, or gallops  Respiratory:  clear to auscultation bilaterally, normal work of breathing GI: soft, nontender, nondistended, + BS Ext: without cyanosis, clubbing, or edema, Good distal pulses bilaterally Neuro:  Alert and Oriented x 3 Psych: euthymic mood, full affect  Wt Readings from Last 3 Encounters:  09/05/19 232 lb 6.4 oz (105.4 kg)  09/21/18 226 lb 9.6 oz (102.8 kg)  12/30/17 230 lb (104.3 kg)      Studies/Labs Reviewed:   EKG:  EKG is  ordered today.  The ekg ordered today demonstrates normal sinus rhythm with right bundle branch block and left anterior fascicular block bifascicular block, inferior Q waves unchanged from prior EKGs reviewed EKG from [facility] that patient brought with him and is the same.  Recent Labs: 09/12/2018: ALT 30; BUN 18; Creatinine, Ser 1.03; Potassium 4.5; Sodium 139   Lipid Panel    Component Value Date/Time   CHOL 138 09/12/2018 0735   TRIG 105 09/12/2018 0735   HDL 74 09/12/2018 0735   CHOLHDL 1.9 09/12/2018 0735   CHOLHDL 1.4 06/24/2016 0738   VLDL 19 06/24/2016 0738   LDLCALC 43 09/12/2018 0735    Additional studies/ records that were reviewed today include:      ASSESSMENT:    1. Atherosclerosis of native coronary artery of native heart without angina pectoris   2. Precordial pain   3. Essential hypertension   4. Hyperlipidemia, unspecified hyperlipidemia type   5. Obesity with body mass index of 30.0-39.9      PLAN:  In order of problems  listed above:  CAD status post CABG in 2006 had some atypical chest pain but has worsening dyspnea on exertion.  Suspect it could be due to obesity and deconditioning.  Spent 14 years since his bypass.  Will order Lexiscan Myoview to rule out ischemia.  Continue aspirin and Lipitor and metoprolol.  Essential hypertension blood pressure running high.  Increase  Zestril to 80 mg daily HCTZ 37.5.  Patient will check his blood pressures regularly.  2 g sodium diet recommended.  Recheck labs fasting lipid panel when he comes back for his Lexiscan.  Hyperlipidemia due for fasting lipid panel.  LDL 43 08/2018.  Obesity weight loss essential to his overall health.  Will refer to weight loss center    Medication Adjustments/Labs and Tests Ordered: Current medicines are reviewed at length with the patient today.  Concerns regarding medicines are outlined above.  Medication changes, Labs and Tests ordered today are listed in the Patient Instructions below. There are no Patient Instructions on file for this visit.   Sumner Boast, PA-C  09/05/2019 10:35 AM    [facility] [address], [place], [place]  [address] Phone: [phone]; Fax: [phone]

## 2019-09-05 ENCOUNTER — Telehealth (HOSPITAL_COMMUNITY): Payer: Self-pay | Admitting: *Deleted

## 2019-09-05 ENCOUNTER — Other Ambulatory Visit: Payer: Self-pay

## 2019-09-05 ENCOUNTER — Encounter (HOSPITAL_COMMUNITY): Payer: Self-pay | Admitting: *Deleted

## 2019-09-05 ENCOUNTER — Encounter: Payer: Self-pay | Admitting: Physician Assistant

## 2019-09-05 ENCOUNTER — Ambulatory Visit (INDEPENDENT_AMBULATORY_CARE_PROVIDER_SITE_OTHER): Payer: Medicare Other | Admitting: Physician Assistant

## 2019-09-05 VITALS — BP 160/84 | HR 61 | Ht 68.0 in | Wt 232.4 lb

## 2019-09-05 DIAGNOSIS — E669 Obesity, unspecified: Secondary | ICD-10-CM

## 2019-09-05 DIAGNOSIS — E785 Hyperlipidemia, unspecified: Secondary | ICD-10-CM

## 2019-09-05 DIAGNOSIS — I10 Essential (primary) hypertension: Secondary | ICD-10-CM | POA: Diagnosis not present

## 2019-09-05 DIAGNOSIS — R072 Precordial pain: Secondary | ICD-10-CM | POA: Diagnosis not present

## 2019-09-05 DIAGNOSIS — I2510 Atherosclerotic heart disease of native coronary artery without angina pectoris: Secondary | ICD-10-CM | POA: Diagnosis not present

## 2019-09-05 MED ORDER — LISINOPRIL 20 MG PO TABS: 20.0000 mg | ORAL_TABLET | Freq: Three times a day (TID) | ORAL | 3 refills | Status: DC

## 2019-09-05 MED ORDER — METOPROLOL TARTRATE 25 MG PO TABS: 25.0000 mg | ORAL_TABLET | Freq: Two times a day (BID) | ORAL | 3 refills | Status: DC

## 2019-09-05 MED ORDER — HYDROCHLOROTHIAZIDE 12.5 MG PO CAPS: 12.5000 mg | ORAL_CAPSULE | Freq: Three times a day (TID) | ORAL | 3 refills | Status: DC

## 2019-09-05 MED ORDER — ATORVASTATIN CALCIUM 80 MG PO TABS: 80.0000 mg | ORAL_TABLET | Freq: Every day | ORAL | 3 refills | Status: DC

## 2019-09-05 NOTE — Telephone Encounter (Signed)
Patient given detailed instructions per Myocardial Perfusion Study Information Sheet for the test on 09/11/2019 at 0745. Patient notified to arrive 15 minutes early and that it is imperative to arrive on time for appointment to keep from having the test rescheduled.  If you need to cancel or reschedule your appointment, please call the office within 24 hours of your appointment. . Patient verbalized understanding.Jassmine Vandruff, Lucius Conn letter sent with instructions.Evaline Waltman, Ranae Palms

## 2019-09-05 NOTE — Patient Instructions (Signed)
Medication Instructions:  Your physician has recommended you make the following change in your medication:  Increase lisinopril 20 mg tablet 3 times per day Increase hydrochlorothiazide 12.5 mg tablet 3 times per day  *If you need a refill on your cardiac medications before your next appointment, please call your pharmacy*  Lab Work: Your physician recommends that you return for a FASTING lipid profile and BMET in 1 week.   If you have labs (blood work) drawn today and your tests are completely normal, you will receive your results only by: Marland Kitchen MyChart Message (if you have MyChart) OR . A paper copy in the mail If you have any lab test that is abnormal or we need to change your treatment, we will call you to review the results.  Testing/Procedures: Your physician has requested that you have a lexiscan myoview. For further information please visit [email or web address]. Please follow instruction sheet, as given.   Follow-Up: You have been referred to Dr. Ilene Qua for medical weight management.   At [facility], you and your health needs are our priority.  As part of our continuing mission to provide you with exceptional heart care, we have created designated Provider Care Teams.  These Care Teams include your primary Cardiologist (physician) and Advanced Practice Providers (APPs -  Physician Assistants and Nurse Practitioners) who all work together to provide you with the care you need, when you need it.  Your next appointment:   After Lexiscan  The format for your next appointment:   Either In Person or Virtual  Provider:   Ermalinda Barrios, PA-C  Other Instructions Your physician has requested that you regularly monitor and record your blood pressure readings at home. Please use the same machine at the same time of day to check your readings and record them to bring to your follow-up visit.   Two Gram Sodium Diet 2000 mg  What is Sodium? Sodium is a mineral found  naturally in many foods. The most significant source of sodium in the diet is table salt, which is about 40% sodium.  Processed, convenience, and preserved foods also contain a large amount of sodium.  The body needs only 500 mg of sodium daily to function,  A normal diet provides more than enough sodium even if you do not use salt.  Why Limit Sodium? A build up of sodium in the body can cause thirst, increased blood pressure, shortness of breath, and water retention.  Decreasing sodium in the diet can reduce edema and risk of heart attack or stroke associated with high blood pressure.  Keep in mind that there are many other factors involved in these health problems.  Heredity, obesity, lack of exercise, cigarette smoking, stress and what you eat all play a role.  General Guidelines:  Do not add salt at the table or in cooking.  One teaspoon of salt contains over 2 grams of sodium.  Read food labels  Avoid processed and convenience foods  Ask your dietitian before eating any foods not dicussed in the menu planning guidelines  Consult your physician if you wish to use a salt substitute or a sodium containing medication such as antacids.  Limit milk and milk products to 16 oz (2 cups) per day.  Shopping Hints:  READ LABELS!! "Dietetic" does not necessarily mean low sodium.  Salt and other sodium ingredients are often added to foods during processing.   Menu Planning Guidelines Food Group Choose More Often Avoid  Beverages (see also the milk group All  fruit juices, low-sodium, salt-free vegetables juices, low-sodium carbonated beverages Regular vegetable or tomato juices, commercially softened water used for drinking or cooking  Breads and Cereals Enriched white, wheat, rye and pumpernickel bread, hard rolls and dinner rolls; muffins, cornbread and waffles; most dry cereals, cooked cereal without added salt; unsalted crackers and breadsticks; low sodium or homemade bread crumbs Bread, rolls  and crackers with salted tops; quick breads; instant hot cereals; pancakes; commercial bread stuffing; self-rising flower and biscuit mixes; regular bread crumbs or cracker crumbs  Desserts and Sweets Desserts and sweets mad with mild should be within allowance Instant pudding mixes and cake mixes  Fats Butter or margarine; vegetable oils; unsalted salad dressings, regular salad dressings limited to 1 Tbs; light, sour and heavy cream Regular salad dressings containing bacon fat, bacon bits, and salt pork; snack dips made with instant soup mixes or processed cheese; salted nuts  Fruits Most fresh, frozen and canned fruits Fruits processed with salt or sodium-containing ingredient (some dried fruits are processed with sodium sulfites        Vegetables Fresh, frozen vegetables and low- sodium canned vegetables Regular canned vegetables, sauerkraut, pickled vegetables, and others prepared in brine; frozen vegetables in sauces; vegetables seasoned with ham, bacon or salt pork  Condiments, Sauces, Miscellaneous  Salt substitute with physician's approval; pepper, herbs, spices; vinegar, lemon or lime juice; hot pepper sauce; garlic powder, onion powder, low sodium soy sauce (1 Tbs.); low sodium condiments (ketchup, chili sauce, mustard) in limited amounts (1 tsp.) fresh ground horseradish; unsalted tortilla chips, pretzels, potato chips, popcorn, salsa (1/4 cup) Any seasoning made with salt including garlic salt, celery salt, onion salt, and seasoned salt; sea salt, rock salt, kosher salt; meat tenderizers; monosodium glutamate; mustard, regular soy sauce, barbecue, sauce, chili sauce, teriyaki sauce, steak sauce, Worcestershire sauce, and most flavored vinegars; canned gravy and mixes; regular condiments; salted snack foods, olives, picles, relish, horseradish sauce, catsup   Food preparation: Try these seasonings Meats:    Pork Sage, onion Serve with applesauce  Chicken Poultry seasoning, thyme, parsley  Serve with cranberry sauce  Lamb Curry powder, rosemary, garlic, thyme Serve with mint sauce or jelly  Veal Marjoram, basil Serve with current jelly, cranberry sauce  Beef Pepper, bay leaf Serve with dry mustard, unsalted chive butter  Fish [place] leaf, dill Serve with unsalted lemon butter, unsalted parsley butter  Vegetables:    Asparagus Lemon juice   Broccoli Lemon juice   Carrots Mustard dressing parsley, mint, nutmeg, glazed with unsalted butter and sugar   Green beans Marjoram, lemon juice, nutmeg,dill seed   Tomatoes Basil, marjoram, onion   Spice /blend for [employer]" 4 tsp ground thyme 1 tsp ground sage 3 tsp ground rosemary 4 tsp ground marjoram   Test your knowledge 1. A product that says "Salt Free" may still contain sodium. True or False 2. Garlic Powder and Hot Pepper Sauce an be used as alternative seasonings.True or False 3. Processed foods have more sodium than fresh foods.  True or False 4. Canned Vegetables have less sodium than froze True or False  WAYS TO DECREASE YOUR SODIUM INTAKE 1. Avoid the use of added salt in cooking and at the table.  Table salt (and other prepared seasonings which contain salt) is probably one of the greatest sources of sodium in the diet.  Unsalted foods can gain flavor from the sweet, sour, and butter taste sensations of herbs and spices.  Instead of using salt for seasoning, try the following seasonings with  the foods listed.  Remember: how you use them to enhance natural food flavors is limited only by your creativity... Allspice-Meat, fish, eggs, fruit, peas, red and yellow vegetables Almond Extract-Fruit baked goods Anise Seed-Sweet breads, fruit, carrots, beets, cottage cheese, cookies (tastes like licorice) Basil-Meat, fish, eggs, vegetables, rice, vegetables salads, soups, sauces Bay Leaf-Meat, fish, stews, poultry Burnet-Salad, vegetables (cucumber-like flavor) Caraway Seed-Bread, cookies, cottage cheese, meat, vegetables, cheese,  rice Cardamon-Baked goods, fruit, soups Celery Powder or seed-Salads, salad dressings, sauces, meatloaf, soup, bread.Do not use  celery salt Chervil-Meats, salads, fish, eggs, vegetables, cottage cheese (parsley-like flavor) Chili Power-Meatloaf, chicken cheese, corn, eggplant, egg dishes Chives-Salads cottage cheese, egg dishes, soups, vegetables, sauces Cilantro-Salsa, casseroles Cinnamon-Baked goods, fruit, pork, lamb, chicken, carrots Cloves-Fruit, baked goods, fish, pot roast, green beans, beets, carrots Coriander-Pastry, cookies, meat, salads, cheese (lemon-orange flavor) Cumin-Meatloaf, fish,cheese, eggs, cabbage,fruit pie (caraway flavor) [employer], fruit, eggs, fish, poultry, cottage cheese, vegetables Dill Seed-Meat, cottage cheese, poultry, vegetables, fish, salads, bread Fennel Seed-Bread, cookies, apples, pork, eggs, fish, beets, cabbage, cheese, Licorice-like flavor Garlic-(buds or powder) Salads, meat, poultry, fish, bread, butter, vegetables, potatoes.Do not  use garlic salt Ginger-Fruit, vegetables, baked goods, meat, fish, poultry Horseradish Root-Meet, vegetables, butter Lemon Juice or Extract-Vegetables, fruit, tea, baked goods, fish salads Mace-Baked goods fruit, vegetables, fish, poultry (taste like nutmeg) Maple Extract-Syrups Marjoram-Meat, chicken, fish, vegetables, breads, green salads (taste like Sage) Mint-Tea, lamb, sherbet, vegetables, desserts, carrots, cabbage Mustard, Dry or Seed-Cheese, eggs, meats, vegetables, poultry Nutmeg-Baked goods, fruit, chicken, eggs, vegetables, desserts Onion Powder-Meat, fish, poultry, vegetables, cheese, eggs, bread, rice salads (Do not use   Onion salt) Orange Extract-Desserts, baked goods Oregano-Pasta, eggs, cheese, onions, pork, lamb, fish, chicken, vegetables, green salads Paprika-Meat, fish, poultry, eggs, cheese, vegetables Parsley Flakes-Butter, vegetables, meat fish, poultry, eggs, bread, salads (certain  forms may   Contain sodium Pepper-Meat fish, poultry, vegetables, eggs Peppermint Extract-Desserts, baked goods Poppy Seed-Eggs, bread, cheese, fruit dressings, baked goods, noodles, vegetables, cottage  [employer], poultry, meat, fish, cauliflower, turnips,eggs bread Saffron-Rice, bread, veal, chicken, fish, eggs Sage-Meat, fish, poultry, onions, eggplant, tomateos, pork, stews Savory-Eggs, salads, poultry, meat, rice, vegetables, soups, pork Tarragon-Meat, poultry, fish, eggs, butter, vegetables (licorice-like flavor)  Thyme-Meat, poultry, fish, eggs, vegetables, (clover-like flavor), sauces, soups Tumeric-Salads, butter, eggs, fish, rice, vegetables (saffron-like flavor) Vanilla Extract-Baked goods, candy Vinegar-Salads, vegetables, meat marinades Walnut Extract-baked goods, candy  2. Choose your Foods Wisely   The following is a list of foods to avoid which are high in sodium:  Meats-Avoid all smoked, canned, salt cured, dried and kosher meat and fish as well as Anchovies   [employer] meats:Bologna, Liverwurst, Pastrami Canned meat or fish  Marinated herring Caviar    Pepperoni Corned Beef   Pizza Dried chipped beef  Salami Frozen breaded fish or meat Salt pork Frankfurters or hot dogs  Sardines Gefilte fish   Sausage Ham (boiled ham, Proscuitto Smoked butt    spiced ham)   Spam      TV Dinners Vegetables Canned vegetables (Regular) Relish Canned mushrooms  Sauerkraut Olives    Tomato juice Pickles  Bakery and Dessert Products Canned puddings  Cream pies Cheesecake   Decorated cakes Cookies  Beverages/Juices Tomato juice, regular  Gatorade   V-8 vegetable juice, regular  Breads and Cereals Biscuit mixes   Salted potato chips, corn chips, pretzels Bread stuffing mixes  Salted crackers and rolls Pancake and waffle mixes Self-rising flour  Seasonings Accent    Meat sauces Barbecue sauce  Meat  tenderizer Catsup    Monosodium glutamate (MSG) Celery salt   Onion salt Chili sauce   Prepared mustard Garlic salt   Salt, seasoned salt, sea salt Gravy mixes   Soy sauce Horseradish   Steak sauce Ketchup   Tartar sauce Lite salt    Teriyaki sauce Marinade mixes   Worcestershire sauce  Others Baking powder   Cocoa and cocoa mixes Baking soda   Commercial casserole mixes Candy-caramels, chocolate  Dehydrated soups    Bars, fudge,nougats  Instant rice and pasta mixes Canned broth or soup  Maraschino cherries Cheese, aged and processed cheese and cheese spreads  Learning Assessment Quiz  Indicated T (for True) or F (for False) for each of the following statements:  1. _____ Fresh fruits and vegetables and unprocessed grains are generally low in sodium 2. _____ Water may contain a considerable amount of sodium, depending on the source 3. _____ You can always tell if a food is high in sodium by tasting it 4. _____ Certain laxatives my be high in sodium and should be avoided unless prescribed   by a physician or pharmacist 5. _____ Salt substitutes may be used freely by anyone on a sodium restricted diet 6. _____ Sodium is present in table salt, food additives and as a natural component of   most foods 7. _____ Table salt is approximately 90% sodium 8. _____ Limiting sodium intake may help prevent excess fluid accumulation in the body 9. _____ On a sodium-restricted diet, seasonings such as bouillon soy sauce, and    cooking wine should be used in place of table salt 10. _____ On an ingredient list, a product which lists monosodium glutamate as the first   ingredient is an appropriate food to include on a low sodium diet  Circle the best answer(s) to the following statements (Hint: there may be more than one correct answer)  11. On a low-sodium diet, some acceptable snack items are:    A. Olives  F. Bean dip   K. Grapefruit juice    B. Salted Pretzels G. Commercial Popcorn   L.  Canned peaches    C. Carrot Sticks  H. Bouillon   M. Unsalted nuts   D. [place] fries  I. Peanut butter crackers N. Salami   E. Sweet pickles J. Tomato Juice   O. Pizza  12.  Seasonings that may be used freely on a reduced - sodium diet include   A. Lemon wedges F.Monosodium glutamate K. Celery seed    B.Soysauce   G. Pepper   L. Mustard powder   C. Sea salt  H. Cooking wine  M. Onion flakes   D. Vinegar  E. Prepared horseradish N. Salsa   E. Sage   J. Worcestershire sauce  O. Chutney

## 2019-09-11 ENCOUNTER — Ambulatory Visit (HOSPITAL_COMMUNITY): Payer: Medicare Other | Attending: Cardiovascular Disease

## 2019-09-11 ENCOUNTER — Other Ambulatory Visit: Payer: Medicare Other | Admitting: *Deleted

## 2019-09-11 ENCOUNTER — Other Ambulatory Visit: Payer: Self-pay

## 2019-09-11 DIAGNOSIS — I10 Essential (primary) hypertension: Secondary | ICD-10-CM

## 2019-09-11 DIAGNOSIS — R072 Precordial pain: Secondary | ICD-10-CM

## 2019-09-11 DIAGNOSIS — I2510 Atherosclerotic heart disease of native coronary artery without angina pectoris: Secondary | ICD-10-CM | POA: Diagnosis not present

## 2019-09-11 DIAGNOSIS — E785 Hyperlipidemia, unspecified: Secondary | ICD-10-CM | POA: Diagnosis not present

## 2019-09-11 LAB — BASIC METABOLIC PANEL
BUN/Creatinine Ratio: 14 (ref 10–24)
BUN: 16 mg/dL (ref 8–27)
CO2: 24 mmol/L (ref 20–29)
Calcium: 9.9 mg/dL (ref 8.6–10.2)
Chloride: 97 mmol/L (ref 96–106)
Creatinine, Ser: 1.14 mg/dL (ref 0.76–1.27)
GFR calc Af Amer: 74 mL/min/{1.73_m2} (ref >59)
GFR calc non Af Amer: 64 mL/min/{1.73_m2} (ref >59)
Glucose: 104 mg/dL — ABNORMAL HIGH (ref 65–99)
Potassium: 5 mmol/L (ref 3.5–5.2)
Sodium: 136 mmol/L (ref 134–144)

## 2019-09-11 LAB — LIPID PANEL
Chol/HDL Ratio: 1.7 ratio (ref 0.0–5.0)
Cholesterol, Total: 141 mg/dL (ref 100–199)
HDL: 84 mg/dL (ref >39)
LDL Chol Calc (NIH): 41 mg/dL (ref 0–99)
Triglycerides: 87 mg/dL (ref 0–149)
VLDL Cholesterol Cal: 16 mg/dL (ref 5–40)

## 2019-09-11 LAB — MYOCARDIAL PERFUSION IMAGING
LV dias vol: 79 mL (ref 62–150)
LV sys vol: 31 mL
Peak HR: 70 {beats}/min
Rest HR: 57 {beats}/min
SDS: 2
SRS: 2
SSS: 4
TID: 1.02

## 2019-09-11 MED ORDER — TECHNETIUM TC 99M TETROFOSMIN IV KIT
10.4000 | PACK | Freq: Once | INTRAVENOUS | Status: AC | PRN
  Administered 2019-09-11: 10.4 via INTRAVENOUS
  Filled 2019-09-11: qty 11

## 2019-09-11 MED ORDER — REGADENOSON 0.4 MG/5ML IV SOLN
0.4000 mg | Freq: Once | INTRAVENOUS | Status: AC
  Administered 2019-09-11: 0.4 mg via INTRAVENOUS

## 2019-09-11 MED ORDER — TECHNETIUM TC 99M TETROFOSMIN IV KIT
32.2000 | PACK | Freq: Once | INTRAVENOUS | Status: AC | PRN
  Administered 2019-09-11: 32.2 via INTRAVENOUS
  Filled 2019-09-11: qty 33

## 2019-09-11 NOTE — Progress Notes (Signed)
Virtual Visit via Telephone Note   This visit type was conducted due to national recommendations for restrictions regarding the COVID-19 Pandemic (e.g. social distancing) in an effort to limit this patient's exposure and mitigate transmission in our community.  Due to his co-morbid illnesses, this patient is at least at moderate risk for complications without adequate follow up.  This format is felt to be most appropriate for this patient at this time.  The patient did not have access to video technology/had technical difficulties with video requiring transitioning to audio format only (telephone).  All issues noted in this document were discussed and addressed.  No physical exam could be performed with this format.  Please refer to the patient's chart for his  consent to telehealth for [facility].   Was supposed to be video but had to be converted to phone  Date:  09/19/2019   ID:  [name], DOB [birth year], MRN [id number]  Patient Location: Home Provider Location: Home  PCP:  Lavone Orn, MD  Cardiologist:  Larae Grooms, MD  Electrophysiologist:  None   Evaluation Performed:  Follow-Up Visit  Chief Complaint:  F/U  History of Present Illness:    Craig Knapp is a 73 y.o. male with with history of CAD status post CABG in 2006, hypertension HLD, obesity   I saw the patient 09/05/2019 complaining of atypical chest pain and worsening dyspnea on exertion.  Blood pressure was also running high.  I increase his Zestril to 80 mg daily HCTZ 37.5 mg daily 2 g sodium diet.  Referred him to the weight loss clinic.  Lexiscan Myoview 09/11/2019-normal LVEF 61% small defect moderate severity in the mid inferior and apical inferior location likely diaphragmatic attenuation.  Low risk study.  Patient still has dyspnea on exertion unchanged. BP 144/70, 126/57, 149/71, 150/69, 153/77, 149/66. Only taking lisinopril 60 mg daily as Rx was filled incorrectly.  The patient does not have  symptoms concerning for COVID-19 infection (fever, chills, cough, or new shortness of breath).    Past Medical History:  Diagnosis Date  . Coronary atherosclerosis of native coronary artery    CABG 2006, EDMUNDS  . DJD (degenerative joint disease) `   back and knees  . Essential hypertension, benign   . History of nuclear stress test    11/08 7 mets, no ischemia, EF 73%  . Male hypogonadism   . Osteomyelitis ([place])    R tibia 1972, recur 2005  . Other and unspecified hyperlipidemia    Past Surgical History:  Procedure Laterality Date  . CORONARY ARTERY BYPASS GRAFT  2006     Current Meds  Medication Sig  . aspirin 81 MG tablet Take 81 mg by mouth daily.  Marland Kitchen atorvastatin (LIPITOR) 80 MG tablet Take 1 tablet (80 mg total) by mouth daily.  . fluticasone (FLONASE) 50 MCG/ACT nasal spray Place into both nostrils as needed for allergies or rhinitis.  . hydrochlorothiazide (MICROZIDE) 12.5 MG capsule Take 1 capsule (12.5 mg total) by mouth 3 (three) times daily.  Marland Kitchen lisinopril (ZESTRIL) 20 MG tablet Take 1 tablet (20 mg total) by mouth 3 (three) times daily.  Marland Kitchen loratadine (CLARITIN) 10 MG tablet Take 10 mg by mouth daily.  . metoprolol tartrate (LOPRESSOR) 25 MG tablet Take 1 tablet (25 mg total) by mouth 2 (two) times daily.  . naproxen sodium (ANAPROX) 220 MG tablet Take 220 mg by mouth as needed (PAIN).   . Omega-3 Fatty Acids (FISH OIL) 1000 MG CAPS Take 2 capsules by  mouth daily.       Allergies:   Patient has no known allergies.   Social History   Tobacco Use  . Smoking status: Never Smoker  . Smokeless tobacco: Current User    Types: Snuff  Substance Use Topics  . Alcohol use: Yes    Alcohol/week: 7.0 standard drinks    Types: 7 Standard drinks or equivalent per week    Comment: daily in afternoons  . Drug use: No     Family Hx: The patient's family history includes Alcoholism in his father; Heart disease in his mother; Lung cancer in his mother. There is no history  of Heart attack.  ROS:   Please see the history of present illness.      All other systems reviewed and are negative.   Prior CV studies:   The following studies were reviewed today:  Lexiscan Myoview 11/23/2020Study Highlights   Nuclear stress EF: 61%. The left ventricular ejection fraction is normal (55-65%).  There was no ST segment deviation noted during stress.  Defect 1: There is a small defect of moderate severity present in the mid inferior and apical inferior location. This is likely due to diaphragmatic attenuation. I cannot exclude a prior non-transmural inferior wall infarction . His LV function is normal and the inferior wall contracts normally .  This is a low risk study.       Labs/Other Tests and Data Reviewed:    EKG:  No ECG reviewed.  Recent Labs: 09/11/2019: BUN 16; Creatinine, Ser 1.14; Potassium 5.0; Sodium 136   Recent Lipid Panel Lab Results  Component Value Date/Time   CHOL 141 09/11/2019 07:59 AM   TRIG 87 09/11/2019 07:59 AM   HDL 84 09/11/2019 07:59 AM   CHOLHDL 1.7 09/11/2019 07:59 AM   CHOLHDL 1.4 06/24/2016 07:38 AM   LDLCALC 41 09/11/2019 07:59 AM    Wt Readings from Last 3 Encounters:  09/19/19 230 lb (104.3 kg)  09/11/19 232 lb (105.2 kg)  09/05/19 232 lb 6.4 oz (105.4 kg)     Objective:    Vital Signs:  BP (!) 151/71   Pulse 79   Ht 5\' 8"  (1.727 m)   Wt 230 lb (104.3 kg)   BMI 34.97 kg/m    VITAL SIGNS:  reviewed  ASSESSMENT & PLAN:    CAD status post CABG in 2006 had some atypical chest pain but has worsening dyspnea on exertion.  Suspect it could be due to obesity and deconditioning.     Lexiscan Myoview 11/23 low risk.  Continue aspirin and Lipitor and metoprolol.   Essential hypertension blood pressure was running high.  I Increased Zestril to 80 mg daily HCTZ 37.5. but prescription was not filled properly. Increase lisinopril to 80 mg daily. Patient will check his blood pressures regularly.  2 gm sodium diet.    Hyperlipidemia  LDL  41 08/2019   Obesity weight loss essential to his overall health-  referred to weight loss center   COVID-19 Education: The signs and symptoms of COVID-19 were discussed with the patient and how to seek care for testing (follow up with PCP or arrange E-visit).   The importance of social distancing was discussed today.  Time:   Today, I have spent 17 minutes with the patient with telehealth technology discussing the above problems.     Medication Adjustments/Labs and Tests Ordered: Current medicines are reviewed at length with the patient today.  Concerns regarding medicines are outlined above.   Tests Ordered: No  orders of the defined types were placed in this encounter.   Medication Changes: No orders of the defined types were placed in this encounter.   Follow Up:  Either In Person or Virtual in 2 week(s) Ermalinda Barrios PA-C  Signed, Ermalinda Barrios, PA-C  09/19/2019 8:50 AM    [place]

## 2019-09-12 ENCOUNTER — Telehealth: Payer: Self-pay | Admitting: Physician Assistant

## 2019-09-12 NOTE — Telephone Encounter (Signed)
Patient returned call for test results.  °

## 2019-09-12 NOTE — Telephone Encounter (Signed)
The patient has been notified of the result and verbalized understanding.  All questions (if any) were answered. Cleon Gustin, RN 09/12/2019 10:38 AM

## 2019-09-12 NOTE — Telephone Encounter (Signed)
-----   Message from Imogene Burn, PA-C sent at 09/11/2019  4:51 PM EST ----- Low risk NST normal heart function. Small defect most likely diaphragmatic attenuation. Keep f/u with me 12/1

## 2019-09-19 ENCOUNTER — Other Ambulatory Visit: Payer: Self-pay

## 2019-09-19 ENCOUNTER — Telehealth: Payer: Self-pay | Admitting: *Deleted

## 2019-09-19 ENCOUNTER — Encounter: Payer: Self-pay | Admitting: Physician Assistant

## 2019-09-19 ENCOUNTER — Telehealth (INDEPENDENT_AMBULATORY_CARE_PROVIDER_SITE_OTHER): Payer: Medicare Other | Admitting: Physician Assistant

## 2019-09-19 VITALS — BP 151/71 | HR 79 | Ht 68.0 in | Wt 230.0 lb

## 2019-09-19 DIAGNOSIS — E669 Obesity, unspecified: Secondary | ICD-10-CM | POA: Diagnosis not present

## 2019-09-19 DIAGNOSIS — E782 Mixed hyperlipidemia: Secondary | ICD-10-CM

## 2019-09-19 DIAGNOSIS — I10 Essential (primary) hypertension: Secondary | ICD-10-CM

## 2019-09-19 DIAGNOSIS — I2510 Atherosclerotic heart disease of native coronary artery without angina pectoris: Secondary | ICD-10-CM | POA: Diagnosis not present

## 2019-09-19 MED ORDER — LISINOPRIL 20 MG PO TABS: 40.0000 mg | ORAL_TABLET | Freq: Two times a day (BID) | ORAL | 3 refills | Status: DC

## 2019-09-19 NOTE — Telephone Encounter (Signed)

## 2019-09-19 NOTE — Patient Instructions (Signed)
Medication Instructions:  Your physician has recommended you make the following change in your medication:   INCREASE: lisinopril 20 mg tablet: Take 2 tablets (40 mg total) by mouth twice a day  If you need a refill on your cardiac medications before your next appointment, please call your pharmacy.   Lab work: Your physician recommends that you return for lab work (BMET) on 09/26/19   If you have labs (blood work) drawn today and your tests are completely normal, you will receive your results only by: Marland Kitchen MyChart Message (if you have MyChart) OR . A paper copy in the mail If you have any lab test that is abnormal or we need to change your treatment, we will call you to review the results.  Testing/Procedures: None ordered  Follow-Up: . Follow up with Ermalinda Barrios, PA via VIRTUAL Visit on 09/27/19 at 11:00 AM  Any Other Special Instructions Will Be Listed Below (If Applicable).  Two Gram Sodium Diet 2000 mg  What is Sodium? Sodium is a mineral found naturally in many foods. The most significant source of sodium in the diet is table salt, which is about 40% sodium.  Processed, convenience, and preserved foods also contain a large amount of sodium.  The body needs only 500 mg of sodium daily to function,  A normal diet provides more than enough sodium even if you do not use salt.  Why Limit Sodium? A build up of sodium in the body can cause thirst, increased blood pressure, shortness of breath, and water retention.  Decreasing sodium in the diet can reduce edema and risk of heart attack or stroke associated with high blood pressure.  Keep in mind that there are many other factors involved in these health problems.  Heredity, obesity, lack of exercise, cigarette smoking, stress and what you eat all play a role.  General Guidelines:  Do not add salt at the table or in cooking.  One teaspoon of salt contains over 2 grams of sodium.  Read food labels  Avoid processed and convenience  foods  Ask your dietitian before eating any foods not dicussed in the menu planning guidelines  Consult your physician if you wish to use a salt substitute or a sodium containing medication such as antacids.  Limit milk and milk products to 16 oz (2 cups) per day.  Shopping Hints:  READ LABELS!! "Dietetic" does not necessarily mean low sodium.  Salt and other sodium ingredients are often added to foods during processing.   Menu Planning Guidelines Food Group Choose More Often Avoid  Beverages (see also the milk group All fruit juices, low-sodium, salt-free vegetables juices, low-sodium carbonated beverages Regular vegetable or tomato juices, commercially softened water used for drinking or cooking  Breads and Cereals Enriched white, wheat, rye and pumpernickel bread, hard rolls and dinner rolls; muffins, cornbread and waffles; most dry cereals, cooked cereal without added salt; unsalted crackers and breadsticks; low sodium or homemade bread crumbs Bread, rolls and crackers with salted tops; quick breads; instant hot cereals; pancakes; commercial bread stuffing; self-rising flower and biscuit mixes; regular bread crumbs or cracker crumbs  Desserts and Sweets Desserts and sweets mad with mild should be within allowance Instant pudding mixes and cake mixes  Fats Butter or margarine; vegetable oils; unsalted salad dressings, regular salad dressings limited to 1 Tbs; light, sour and heavy cream Regular salad dressings containing bacon fat, bacon bits, and salt pork; snack dips made with instant soup mixes or processed cheese; salted nuts  Fruits Most  fresh, frozen and canned fruits Fruits processed with salt or sodium-containing ingredient (some dried fruits are processed with sodium sulfites        Vegetables Fresh, frozen vegetables and low- sodium canned vegetables Regular canned vegetables, sauerkraut, pickled vegetables, and others prepared in brine; frozen vegetables in sauces; vegetables  seasoned with ham, bacon or salt pork  Condiments, Sauces, Miscellaneous  Salt substitute with physician's approval; pepper, herbs, spices; vinegar, lemon or lime juice; hot pepper sauce; garlic powder, onion powder, low sodium soy sauce (1 Tbs.); low sodium condiments (ketchup, chili sauce, mustard) in limited amounts (1 tsp.) fresh ground horseradish; unsalted tortilla chips, pretzels, potato chips, popcorn, salsa (1/4 cup) Any seasoning made with salt including garlic salt, celery salt, onion salt, and seasoned salt; sea salt, rock salt, kosher salt; meat tenderizers; monosodium glutamate; mustard, regular soy sauce, barbecue, sauce, chili sauce, teriyaki sauce, steak sauce, Worcestershire sauce, and most flavored vinegars; canned gravy and mixes; regular condiments; salted snack foods, olives, picles, relish, horseradish sauce, catsup   Food preparation: Try these seasonings Meats:    Pork Sage, onion Serve with applesauce  Chicken Poultry seasoning, thyme, parsley Serve with cranberry sauce  Lamb Curry powder, rosemary, garlic, thyme Serve with mint sauce or jelly  Veal Marjoram, basil Serve with current jelly, cranberry sauce  Beef Pepper, bay leaf Serve with dry mustard, unsalted chive butter  Fish [place] leaf, dill Serve with unsalted lemon butter, unsalted parsley butter  Vegetables:    Asparagus Lemon juice   Broccoli Lemon juice   Carrots Mustard dressing parsley, mint, nutmeg, glazed with unsalted butter and sugar   Green beans Marjoram, lemon juice, nutmeg,dill seed   Tomatoes Basil, marjoram, onion   Spice /blend for [employer]" 4 tsp ground thyme 1 tsp ground sage 3 tsp ground rosemary 4 tsp ground marjoram   Test your knowledge 1. A product that says "Salt Free" may still contain sodium. True or False 2. Garlic Powder and Hot Pepper Sauce an be used as alternative seasonings.True or False 3. Processed foods have more sodium than fresh foods.  True or False 4. Canned  Vegetables have less sodium than froze True or False  WAYS TO DECREASE YOUR SODIUM INTAKE 1. Avoid the use of added salt in cooking and at the table.  Table salt (and other prepared seasonings which contain salt) is probably one of the greatest sources of sodium in the diet.  Unsalted foods can gain flavor from the sweet, sour, and butter taste sensations of herbs and spices.  Instead of using salt for seasoning, try the following seasonings with the foods listed.  Remember: how you use them to enhance natural food flavors is limited only by your creativity... Allspice-Meat, fish, eggs, fruit, peas, red and yellow vegetables Almond Extract-Fruit baked goods Anise Seed-Sweet breads, fruit, carrots, beets, cottage cheese, cookies (tastes like licorice) Basil-Meat, fish, eggs, vegetables, rice, vegetables salads, soups, sauces Bay Leaf-Meat, fish, stews, poultry Burnet-Salad, vegetables (cucumber-like flavor) Caraway Seed-Bread, cookies, cottage cheese, meat, vegetables, cheese, rice Cardamon-Baked goods, fruit, soups Celery Powder or seed-Salads, salad dressings, sauces, meatloaf, soup, bread.Do not use  celery salt Chervil-Meats, salads, fish, eggs, vegetables, cottage cheese (parsley-like flavor) Chili Power-Meatloaf, chicken cheese, corn, eggplant, egg dishes Chives-Salads cottage cheese, egg dishes, soups, vegetables, sauces Cilantro-Salsa, casseroles Cinnamon-Baked goods, fruit, pork, lamb, chicken, carrots Cloves-Fruit, baked goods, fish, pot roast, green beans, beets, carrots Coriander-Pastry, cookies, meat, salads, cheese (lemon-orange flavor) Cumin-Meatloaf, fish,cheese, eggs, cabbage,fruit pie (caraway flavor) [employer], fruit, eggs, fish, poultry,  cottage cheese, vegetables Dill Seed-Meat, cottage cheese, poultry, vegetables, fish, salads, bread Fennel Seed-Bread, cookies, apples, pork, eggs, fish, beets, cabbage, cheese, Licorice-like flavor Garlic-(buds or powder) Salads,  meat, poultry, fish, bread, butter, vegetables, potatoes.Do not  use garlic salt Ginger-Fruit, vegetables, baked goods, meat, fish, poultry Horseradish Root-Meet, vegetables, butter Lemon Juice or Extract-Vegetables, fruit, tea, baked goods, fish salads Mace-Baked goods fruit, vegetables, fish, poultry (taste like nutmeg) Maple Extract-Syrups Marjoram-Meat, chicken, fish, vegetables, breads, green salads (taste like Sage) Mint-Tea, lamb, sherbet, vegetables, desserts, carrots, cabbage Mustard, Dry or Seed-Cheese, eggs, meats, vegetables, poultry Nutmeg-Baked goods, fruit, chicken, eggs, vegetables, desserts Onion Powder-Meat, fish, poultry, vegetables, cheese, eggs, bread, rice salads (Do not use   Onion salt) Orange Extract-Desserts, baked goods Oregano-Pasta, eggs, cheese, onions, pork, lamb, fish, chicken, vegetables, green salads Paprika-Meat, fish, poultry, eggs, cheese, vegetables Parsley Flakes-Butter, vegetables, meat fish, poultry, eggs, bread, salads (certain forms may   Contain sodium Pepper-Meat fish, poultry, vegetables, eggs Peppermint Extract-Desserts, baked goods Poppy Seed-Eggs, bread, cheese, fruit dressings, baked goods, noodles, vegetables, cottage  [employer], poultry, meat, fish, cauliflower, turnips,eggs bread Saffron-Rice, bread, veal, chicken, fish, eggs Sage-Meat, fish, poultry, onions, eggplant, tomateos, pork, stews Savory-Eggs, salads, poultry, meat, rice, vegetables, soups, pork Tarragon-Meat, poultry, fish, eggs, butter, vegetables (licorice-like flavor)  Thyme-Meat, poultry, fish, eggs, vegetables, (clover-like flavor), sauces, soups Tumeric-Salads, butter, eggs, fish, rice, vegetables (saffron-like flavor) Vanilla Extract-Baked goods, candy Vinegar-Salads, vegetables, meat marinades Walnut Extract-baked goods, candy  2. Choose your Foods Wisely   The following is a list of foods to avoid which are high in  sodium:  Meats-Avoid all smoked, canned, salt cured, dried and kosher meat and fish as well as Anchovies   [employer] meats:Bologna, Liverwurst, Pastrami Canned meat or fish  Marinated herring Caviar    Pepperoni Corned Beef   Pizza Dried chipped beef  Salami Frozen breaded fish or meat Salt pork Frankfurters or hot dogs  Sardines Gefilte fish   Sausage Ham (boiled ham, Proscuitto Smoked butt    spiced ham)   Spam      TV Dinners Vegetables Canned vegetables (Regular) Relish Canned mushrooms  Sauerkraut Olives    Tomato juice Pickles  Bakery and Dessert Products Canned puddings  Cream pies Cheesecake   Decorated cakes Cookies  Beverages/Juices Tomato juice, regular  Gatorade   V-8 vegetable juice, regular  Breads and Cereals Biscuit mixes   Salted potato chips, corn chips, pretzels Bread stuffing mixes  Salted crackers and rolls Pancake and waffle mixes Self-rising flour  Seasonings Accent    Meat sauces Barbecue sauce  Meat tenderizer Catsup    Monosodium glutamate (MSG) Celery salt   Onion salt Chili sauce   Prepared mustard Garlic salt   Salt, seasoned salt, sea salt Gravy mixes   Soy sauce Horseradish   Steak sauce Ketchup   Tartar sauce Lite salt    Teriyaki sauce Marinade mixes   Worcestershire sauce  Others Baking powder   Cocoa and cocoa mixes Baking soda   Commercial casserole mixes Candy-caramels, chocolate  Dehydrated soups    Bars, fudge,nougats  Instant rice and pasta mixes Canned broth or soup  Maraschino cherries Cheese, aged and processed cheese and cheese spreads  Learning Assessment Quiz  Indicated T (for True) or F (for False) for each of the following statements:  1. _____ Fresh fruits and vegetables and unprocessed grains are generally low in sodium 2. _____ Water may contain a considerable amount of sodium, depending on the  source 3. _____ Dennis Bast can always tell if a food is high in sodium by tasting it 4. _____ Certain  laxatives my be high in sodium and should be avoided unless prescribed   by a physician or pharmacist 5. _____ Salt substitutes may be used freely by anyone on a sodium restricted diet 6. _____ Sodium is present in table salt, food additives and as a natural component of   most foods 7. _____ Table salt is approximately 90% sodium 8. _____ Limiting sodium intake may help prevent excess fluid accumulation in the body 9. _____ On a sodium-restricted diet, seasonings such as bouillon soy sauce, and    cooking wine should be used in place of table salt 10. _____ On an ingredient list, a product which lists monosodium glutamate as the first   ingredient is an appropriate food to include on a low sodium diet  Circle the best answer(s) to the following statements (Hint: there may be more than one correct answer)  11. On a low-sodium diet, some acceptable snack items are:    A. Olives  F. Bean dip   K. Grapefruit juice    B. Salted Pretzels G. Commercial Popcorn   L. Canned peaches    C. Carrot Sticks  H. Bouillon   M. Unsalted nuts   D. [place] fries  I. Peanut butter crackers N. Salami   E. Sweet pickles J. Tomato Juice   O. Pizza  12.  Seasonings that may be used freely on a reduced - sodium diet include   A. Lemon wedges F.Monosodium glutamate K. Celery seed    B.Soysauce   G. Pepper   L. Mustard powder   C. Sea salt  H. Cooking wine  M. Onion flakes   D. Vinegar  E. Prepared horseradish N. Salsa   E. Sage   J. Worcestershire sauce  O. Chutney

## 2019-09-26 ENCOUNTER — Other Ambulatory Visit: Payer: Medicare Other

## 2019-09-26 ENCOUNTER — Other Ambulatory Visit: Payer: Self-pay

## 2019-09-26 DIAGNOSIS — E782 Mixed hyperlipidemia: Secondary | ICD-10-CM | POA: Diagnosis not present

## 2019-09-26 DIAGNOSIS — E669 Obesity, unspecified: Secondary | ICD-10-CM

## 2019-09-26 DIAGNOSIS — I10 Essential (primary) hypertension: Secondary | ICD-10-CM

## 2019-09-26 DIAGNOSIS — I2510 Atherosclerotic heart disease of native coronary artery without angina pectoris: Secondary | ICD-10-CM | POA: Diagnosis not present

## 2019-09-26 LAB — BASIC METABOLIC PANEL
BUN/Creatinine Ratio: 16 (ref 10–24)
BUN: 16 mg/dL (ref 8–27)
CO2: 22 mmol/L (ref 20–29)
Calcium: 9.5 mg/dL (ref 8.6–10.2)
Chloride: 96 mmol/L (ref 96–106)
Creatinine, Ser: 0.99 mg/dL (ref 0.76–1.27)
GFR calc Af Amer: 88 mL/min/{1.73_m2} (ref >59)
GFR calc non Af Amer: 76 mL/min/{1.73_m2} (ref >59)
Glucose: 111 mg/dL — ABNORMAL HIGH (ref 65–99)
Potassium: 4.6 mmol/L (ref 3.5–5.2)
Sodium: 133 mmol/L — ABNORMAL LOW (ref 134–144)

## 2019-09-26 NOTE — Progress Notes (Signed)
Virtual Visit via Video Note   This visit type was conducted due to national recommendations for restrictions regarding the COVID-19 Pandemic (e.g. social distancing) in an effort to limit this patient's exposure and mitigate transmission in our community.  Due to his co-morbid illnesses, this patient is at least at moderate risk for complications without adequate follow up.  This format is felt to be most appropriate for this patient at this time.  All issues noted in this document were discussed and addressed.  A limited physical exam was performed with this format.  Please refer to the patient's chart for his consent to telehealth for [facility]. Had to convert to phone as audio wasn't working  Date:  09/27/2019   ID:  Craig Knapp, DOB 11/12/1945, MRN [id number]  Patient Location: Home Provider Location: Home  PCP:  Lavone Orn, MD  Cardiologist:  Larae Grooms, MD   Electrophysiologist:  None   Evaluation Performed:  Follow-Up Visit  Chief Complaint:  Follow up  History of Present Illness:    Craig Knapp is a 73 y.o. male with history of CAD status post CABG in 2006, hypertension HLD, obesity   I saw the patient 09/05/2019 complaining of atypical chest pain and worsening dyspnea on exertion.  Blood pressure was also running high.  I increase his Zestril to 80 mg daily HCTZ 37.5 mg daily 2 g sodium diet.  Referred him to the weight loss clinic.  Lexiscan Myoview 09/11/2019-normal LVEF 61% small defect moderate severity in the mid inferior and apical inferior location likely diaphragmatic attenuation.  Low risk study.  I saw the patient 09/19/2019 with still dyspnea on exertion.  Blood pressure remains elevated at home.  Prescription for zestril was not filled properly and he was only on 60 mg daily. I increased it 80 mg daily. 09/26/19 labs reviewed with patient and were normal.  Blood pressure doing much better since lisinopril increased- 124/64,130's/80's. Stays  active but limited because of back problems. Has chronic dyspnea on exertion-emphysema on CT in July.  The patient does not have symptoms concerning for COVID-19 infection (fever, chills, cough, or new shortness of breath).    Past Medical History:  Diagnosis Date  . Coronary atherosclerosis of native coronary artery    CABG 2006, EDMUNDS  . DJD (degenerative joint disease) `   back and knees  . Essential hypertension, benign   . History of nuclear stress test    11/08 7 mets, no ischemia, EF 73%  . Male hypogonadism   . Osteomyelitis ([place])    R tibia 1972, recur 2005  . Other and unspecified hyperlipidemia    Past Surgical History:  Procedure Laterality Date  . CORONARY ARTERY BYPASS GRAFT  2006     Current Meds  Medication Sig  . aspirin 81 MG tablet Take 81 mg by mouth daily.  Marland Kitchen atorvastatin (LIPITOR) 80 MG tablet Take 1 tablet (80 mg total) by mouth daily.  . fluticasone (FLONASE) 50 MCG/ACT nasal spray Place into both nostrils as needed for allergies or rhinitis.  . hydrochlorothiazide (MICROZIDE) 12.5 MG capsule Take 1 capsule (12.5 mg total) by mouth 3 (three) times daily.  Marland Kitchen lisinopril (ZESTRIL) 20 MG tablet Take 2 tablets (40 mg total) by mouth 2 (two) times daily.  Marland Kitchen loratadine (CLARITIN) 10 MG tablet Take 10 mg by mouth daily.  . metoprolol tartrate (LOPRESSOR) 25 MG tablet Take 1 tablet (25 mg total) by mouth 2 (two) times daily.  . naproxen sodium (ANAPROX) 220 MG tablet  Take 220 mg by mouth as needed (PAIN).   . Omega-3 Fatty Acids (FISH OIL) 1000 MG CAPS Take 2 capsules by mouth daily.       Allergies:   Patient has no known allergies.   Social History   Tobacco Use  . Smoking status: Never Smoker  . Smokeless tobacco: Current User    Types: Snuff  Substance Use Topics  . Alcohol use: Yes    Alcohol/week: 7.0 standard drinks    Types: 7 Standard drinks or equivalent per week    Comment: daily in afternoons  . Drug use: No     Family Hx: The  patient's family history includes Alcoholism in his father; Heart disease in his mother; Lung cancer in his mother. There is no history of Heart attack.  ROS:   Please see the history of present illness.      All other systems reviewed and are negative.   Prior CV studies:   The following studies were reviewed today:   Lexiscan Myoview 11/23/2020Study Highlights    Nuclear stress EF: 61%. The left ventricular ejection fraction is normal (55-65%).  There was no ST segment deviation noted during stress.  Defect 1: There is a small defect of moderate severity present in the mid inferior and apical inferior location. This is likely due to diaphragmatic attenuation. I cannot exclude a prior non-transmural inferior wall infarction . His LV function is normal and the inferior wall contracts normally .  This is a low risk study.          Labs/Other Tests and Data Reviewed:    EKG:  No ECG reviewed.  Recent Labs: 09/26/2019: BUN 16; Creatinine, Ser 0.99; Potassium 4.6; Sodium 133   Recent Lipid Panel Lab Results  Component Value Date/Time   CHOL 141 09/11/2019 07:59 AM   TRIG 87 09/11/2019 07:59 AM   HDL 84 09/11/2019 07:59 AM   CHOLHDL 1.7 09/11/2019 07:59 AM   CHOLHDL 1.4 06/24/2016 07:38 AM   LDLCALC 41 09/11/2019 07:59 AM    Wt Readings from Last 3 Encounters:  09/27/19 230 lb (104.3 kg)  09/19/19 230 lb (104.3 kg)  09/11/19 232 lb (105.2 kg)     Objective:    Vital Signs:  BP 132/73   Pulse (!) 54   Wt 230 lb (104.3 kg)   BMI 34.97 kg/m    VITAL SIGNS:  reviewed GEN:  no acute distress RESPIRATORY:  normal respiratory effort, symmetric expansion CARDIOVASCULAR:  no peripheral edema  ASSESSMENT & PLAN:     CAD status post CABG in 2006 had some atypical chest pain but has worsening dyspnea on exertion.  Suspect it could be due to obesity and deconditioning.     Lexiscan Myoview 11/23 low risk.  Continue aspirin and Lipitor and metoprolol.   Essential  hypertension blood pressure was running high.  I Increased Zestril to 80 mg daily HCTZ 37.5. Marland Kitchen  2 gm sodium diet. F/u renal function normal 09/26/19. BP much better now. Continue current treatment.   Hyperlipidemia  LDL  41 08/2019   Obesity weight loss essential to his overall health-  referred to weight loss center   Emphysema with chronic dyspnea on exertion      COVID-19 Education: The signs and symptoms of COVID-19 were discussed with the patient and how to seek care for testing (follow up with PCP or arrange E-visit).   The importance of social distancing was discussed today.  Time:   Today, I have spent 14:20  minutes with the patient with telehealth technology discussing the above problems.     Medication Adjustments/Labs and Tests Ordered: Current medicines are reviewed at length with the patient today.  Concerns regarding medicines are outlined above.   Tests Ordered: No orders of the defined types were placed in this encounter.   Medication Changes: No orders of the defined types were placed in this encounter.   Follow Up:  Either In Person or Virtual in 6 month(s) Dr. Irish Lack   Signed, Ermalinda Barrios, PA-C  09/27/2019 11:25 AM    [place]

## 2019-09-27 ENCOUNTER — Encounter: Payer: Self-pay | Admitting: Physician Assistant

## 2019-09-27 ENCOUNTER — Telehealth (INDEPENDENT_AMBULATORY_CARE_PROVIDER_SITE_OTHER): Payer: Medicare Other | Admitting: Physician Assistant

## 2019-09-27 VITALS — BP 132/73 | HR 54 | Wt 230.0 lb

## 2019-09-27 DIAGNOSIS — Z8709 Personal history of other diseases of the respiratory system: Secondary | ICD-10-CM | POA: Diagnosis not present

## 2019-09-27 DIAGNOSIS — E669 Obesity, unspecified: Secondary | ICD-10-CM

## 2019-09-27 DIAGNOSIS — E7849 Other hyperlipidemia: Secondary | ICD-10-CM

## 2019-09-27 DIAGNOSIS — I10 Essential (primary) hypertension: Secondary | ICD-10-CM

## 2019-09-27 DIAGNOSIS — I2510 Atherosclerotic heart disease of native coronary artery without angina pectoris: Secondary | ICD-10-CM | POA: Diagnosis not present

## 2019-09-27 NOTE — Patient Instructions (Signed)
Medication Instructions:  Your physician recommends that you continue on your current medications as directed. Please refer to the Current Medication list given to you today.  *If you need a refill on your cardiac medications before your next appointment, please call your pharmacy*  Lab Work: None ordered  If you have labs (blood work) drawn today and your tests are completely normal, you will receive your results only by: Marland Kitchen MyChart Message (if you have MyChart) OR . A paper copy in the mail If you have any lab test that is abnormal or we need to change your treatment, we will call you to review the results.  Testing/Procedures: None ordered  Follow-Up: At [facility], you and your health needs are our priority.  As part of our continuing mission to provide you with exceptional heart care, we have created designated Provider Care Teams.  These Care Teams include your primary Cardiologist (physician) and Advanced Practice Providers (APPs -  Physician Assistants and Nurse Practitioners) who all work together to provide you with the care you need, when you need it.  Your next appointment:   6 month(s)  The format for your next appointment:   Either In Person or Virtual  Provider:   You may see Larae Grooms, MD or one of the following Advanced Practice Providers on your designated Care Team:    Melina Copa, PA-C  Ermalinda Barrios, PA-C

## 2019-10-24 ENCOUNTER — Ambulatory Visit: Payer: Medicare Other | Admitting: Interventional Cardiology

## 2020-03-26 DIAGNOSIS — M545 Low back pain: Secondary | ICD-10-CM | POA: Diagnosis not present

## 2020-03-27 NOTE — Progress Notes (Signed)
Cardiology Office Note   Date:  03/29/2020   ID:  Craig Knapp, DOB 1946-01-07, MRN [id number]  PCP:  Lavone Orn, MD    No chief complaint on file.  CAD  Wt Readings from Last 3 Encounters:  03/29/20 223 lb (101.2 kg)  09/27/19 230 lb (104.3 kg)  09/19/19 230 lb (104.3 kg)       History of Present Illness: Craig Knapp is a 74 y.o. male   who had CABG in 2006. SHOB was his primary symptom along with tightnessin his chest. He had a lot of sweating. He never had an MI. Quit smoking after surgery. He usedsome chewing tobacco for a while.  2016 ultrasound showed no AAA.  He also had a wound on his shin.He had an ultrasound at [facility] to check circulation. ..since accident many years ago.  2015 MRI at [facility] showed: 5/4/15MRI Tib/fib Results: (to be scanned in under Media) "There is a rim enhancing cavity in the anterior aspect of the mid right tibia measuring 2.9 x 2.4 x 2 cm with a fistula to the skin surface through a defect in the anterior cortex of the tibia. There are at least 3 definable areas of low signal intensity within the cavity measuring between 4 and 6 mm which I suspect represent bony sequestra.  This is at an area of healed fracture of the mid tibial shaft. The patient dose have focal grade 4 chondromalacia of the tibiotalar joint.   IMPRESSION: -Focal osteomyelitis of the anterior aspect of the mid right tibia with bony sequestra and a communicating fistula to the skin surface."  Wound care has been done.   He had some atypical chest soreness in January 2019.  This resolved.  In November 2020, he experienced some atypical chest pain and dyspnea on exertion.  His blood pressure was also high.  His Zestril was increased to 80 mg daily.  He was referred to the weight loss clinic.  He had a pharmacologic nuclear stress test showing ejection fraction of 61% with likely diaphragmatic attenuation.  It was a low risk study.  He does carry a diagnosis  of emphysema which was documented by CT scan in the past.  Since the last visit, he has had some chronic ankle pain, worse in the afternoons.  He has compression stockings but has not been using them.   No calf pain with walking. Still open with some mild drainage.  BP has been better controlled.   Denies : Chest pain. Dizziness. Leg edema. Nitroglycerin use. Orthopnea. Palpitations. Paroxysmal nocturnal dyspnea. Shortness of breath. Syncope.   Past Medical History:  Diagnosis Date  . Coronary atherosclerosis of native coronary artery    CABG 2006, EDMUNDS  . DJD (degenerative joint disease) `   back and knees  . Essential hypertension, benign   . History of nuclear stress test    11/08 7 mets, no ischemia, EF 73%  . Male hypogonadism   . Osteomyelitis ([place])    R tibia 1972, recur 2005  . Other and unspecified hyperlipidemia     Past Surgical History:  Procedure Laterality Date  . CORONARY ARTERY BYPASS GRAFT  2006     Current Outpatient Medications  Medication Sig Dispense Refill  . aspirin 81 MG tablet Take 81 mg by mouth daily.    Marland Kitchen atorvastatin (LIPITOR) 80 MG tablet Take 1 tablet (80 mg total) by mouth daily. 90 tablet 3  . fluticasone (FLONASE) 50 MCG/ACT nasal spray Place into both nostrils as  needed for allergies or rhinitis.    . hydrochlorothiazide (MICROZIDE) 12.5 MG capsule Take 1 capsule (12.5 mg total) by mouth 3 (three) times daily. (Patient taking differently: Take 12.5 mg by mouth 2 (two) times daily. ) 270 capsule 3  . lisinopril (ZESTRIL) 20 MG tablet Take 2 tablets (40 mg total) by mouth 2 (two) times daily. (Patient taking differently: Take 20 mg by mouth 2 (two) times daily. ) 360 tablet 3  . loratadine (CLARITIN) 10 MG tablet Take 10 mg by mouth daily.    . metoprolol tartrate (LOPRESSOR) 25 MG tablet Take 1 tablet (25 mg total) by mouth 2 (two) times daily. 180 tablet 3  . naproxen sodium (ANAPROX) 220 MG tablet Take 220 mg by mouth as needed (PAIN).       . Omega-3 Fatty Acids (FISH OIL) 1000 MG CAPS Take 2 capsules by mouth daily.       No current facility-administered medications for this visit.    Allergies:   Patient has no known allergies.    Social History:  The patient  reports that he has never smoked. His smokeless tobacco use includes snuff. He reports current alcohol use of about 7.0 standard drinks of alcohol per week. He reports that he does not use drugs.   Family History:  The patient's family history includes Alcoholism in his father; Heart disease in his mother; Lung cancer in his mother.    ROS:  Please see the history of present illness.   Otherwise, review of systems are positive for chronic right leg infection; back pain- chronic (spinal stenosis).   All other systems are reviewed and negative.    PHYSICAL EXAM: VS:  BP 132/80   Pulse (!) 51   Ht 5\' 8"  (1.727 m)   Wt 223 lb (101.2 kg)   SpO2 94%   BMI 33.91 kg/m  , BMI Body mass index is 33.91 kg/m. GEN: Well nourished, well developed, in no acute distress  HEENT: normal  Neck: no JVD, carotid bruits, or masses Cardiac: RRR; no murmurs, rubs, or gallops,no edema  Respiratory:  clear to auscultation bilaterally, normal work of breathing GI: soft, nontender, nondistended, + BS MS: no deformity or atrophy ; right shin bandaged Skin: warm and dry, no rash Neuro:  Strength and sensation are intact Psych: euthymic mood, full affect   EKG:   The ekg ordered 08/2019 demonstrates NSR, RBBB   Recent Labs: 09/26/2019: BUN 16; Creatinine, Ser 0.99; Potassium 4.6; Sodium 133   Lipid Panel    Component Value Date/Time   CHOL 141 09/11/2019 0759   TRIG 87 09/11/2019 0759   HDL 84 09/11/2019 0759   CHOLHDL 1.7 09/11/2019 0759   CHOLHDL 1.4 06/24/2016 0738   VLDL 19 06/24/2016 0738   LDLCALC 41 09/11/2019 0759     Other studies Reviewed: Additional studies/ records that were reviewed today with results demonstrating: 08/2019, 09/2019 labs reviewed and well  controlled.    ASSESSMENT AND PLAN:  1. CAD: s/p CABG. continue aggressive secondary prevention. 2. Hypertension: Low-salt diet.  The current medical regimen is effective;  continue present plan and medications. 3. Hyperlipidemia: Whole food, plant-based diet recommended.  Reduce red meat intake.  LDL 41 in 11/20.  Continue atorvastatin.  Needs LFTs checked. 4. Aortic atherosclerosis: Continue statin.  Documented by chest CT scan in July 2020.   Current medicines are reviewed at length with the patient today.  The patient concerns regarding his medicines were addressed.  The following changes have been  made:  No change  Labs/ tests ordered today include:  No orders of the defined types were placed in this encounter.   Recommend 150 minutes/week of aerobic exercise Low fat, low carb, high fiber diet recommended  Disposition:   FU in 1 year   Signed, Larae Grooms, MD  03/29/2020 11:15 AM    [address], [place], [place]  [address] Phone: [phone]; Fax: [phone]

## 2020-03-29 ENCOUNTER — Encounter: Payer: Self-pay | Admitting: Interventional Cardiology

## 2020-03-29 ENCOUNTER — Other Ambulatory Visit: Payer: Self-pay

## 2020-03-29 ENCOUNTER — Ambulatory Visit (INDEPENDENT_AMBULATORY_CARE_PROVIDER_SITE_OTHER): Payer: Medicare Other | Admitting: Interventional Cardiology

## 2020-03-29 VITALS — BP 132/80 | HR 51 | Ht 68.0 in | Wt 223.0 lb

## 2020-03-29 DIAGNOSIS — Z8709 Personal history of other diseases of the respiratory system: Secondary | ICD-10-CM | POA: Diagnosis not present

## 2020-03-29 DIAGNOSIS — I10 Essential (primary) hypertension: Secondary | ICD-10-CM

## 2020-03-29 DIAGNOSIS — I2510 Atherosclerotic heart disease of native coronary artery without angina pectoris: Secondary | ICD-10-CM | POA: Diagnosis not present

## 2020-03-29 DIAGNOSIS — E782 Mixed hyperlipidemia: Secondary | ICD-10-CM | POA: Diagnosis not present

## 2020-03-29 DIAGNOSIS — E669 Obesity, unspecified: Secondary | ICD-10-CM

## 2020-03-29 DIAGNOSIS — I700 Atherosclerosis of aorta: Secondary | ICD-10-CM | POA: Diagnosis not present

## 2020-03-29 NOTE — Patient Instructions (Signed)
Medication Instructions:  Your physician recommends that you continue on your current medications as directed. Please refer to the Current Medication list given to you today.  *If you need a refill on your cardiac medications before your next appointment, please call your pharmacy*   Lab Work: None ordered  If you have labs (blood work) drawn today and your tests are completely normal, you will receive your results only by: . MyChart Message (if you have MyChart) OR . A paper copy in the mail If you have any lab test that is abnormal or we need to change your treatment, we will call you to review the results.   Testing/Procedures: None ordered   Follow-Up: At [facility], you and your health needs are our priority.  As part of our continuing mission to provide you with exceptional heart care, we have created designated Provider Care Teams.  These Care Teams include your primary Cardiologist (physician) and Advanced Practice Providers (APPs -  Physician Assistants and Nurse Practitioners) who all work together to provide you with the care you need, when you need it.  We recommend signing up for the patient portal called "MyChart".  Sign up information is provided on this After Visit Summary.  MyChart is used to connect with patients for Virtual Visits (Telemedicine).  Patients are able to view lab/test results, encounter notes, upcoming appointments, etc.  Non-urgent messages can be sent to your provider as well.   To learn more about what you can do with MyChart, go to [email or web address].    Your next appointment:   12 month(s)  The format for your next appointment:   In Person  Provider:   You may see Jayadeep Varanasi, MD or one of the following Advanced Practice Providers on your designated Care Team:    Dayna Dunn, PA-C  Michele Lenze, PA-C    Other Instructions None  

## 2020-04-03 DIAGNOSIS — M545 Low back pain: Secondary | ICD-10-CM | POA: Diagnosis not present

## 2020-04-04 DIAGNOSIS — M4316 Spondylolisthesis, lumbar region: Secondary | ICD-10-CM | POA: Diagnosis not present

## 2020-04-04 DIAGNOSIS — M545 Low back pain: Secondary | ICD-10-CM | POA: Diagnosis not present

## 2020-04-18 DIAGNOSIS — M48062 Spinal stenosis, lumbar region with neurogenic claudication: Secondary | ICD-10-CM | POA: Diagnosis not present

## 2020-05-14 ENCOUNTER — Telehealth: Payer: Self-pay | Admitting: Physician Assistant

## 2020-05-14 NOTE — Telephone Encounter (Signed)
Lolita Patella from [facility] is calling requesting the patient's most recent EKG and Stress Test results be faxed over. The fax number to their office is [phone]. Please advise.

## 2020-05-16 DIAGNOSIS — M48062 Spinal stenosis, lumbar region with neurogenic claudication: Secondary | ICD-10-CM | POA: Diagnosis not present

## 2020-06-06 DIAGNOSIS — I10 Essential (primary) hypertension: Secondary | ICD-10-CM | POA: Diagnosis not present

## 2020-06-06 DIAGNOSIS — Z0000 Encounter for general adult medical examination without abnormal findings: Secondary | ICD-10-CM | POA: Diagnosis not present

## 2020-06-06 DIAGNOSIS — M5442 Lumbago with sciatica, left side: Secondary | ICD-10-CM | POA: Diagnosis not present

## 2020-06-06 DIAGNOSIS — Z125 Encounter for screening for malignant neoplasm of prostate: Secondary | ICD-10-CM | POA: Diagnosis not present

## 2020-06-06 DIAGNOSIS — I2510 Atherosclerotic heart disease of native coronary artery without angina pectoris: Secondary | ICD-10-CM | POA: Diagnosis not present

## 2020-06-06 DIAGNOSIS — Z1389 Encounter for screening for other disorder: Secondary | ICD-10-CM | POA: Diagnosis not present

## 2020-06-06 DIAGNOSIS — Z23 Encounter for immunization: Secondary | ICD-10-CM | POA: Diagnosis not present

## 2020-06-06 DIAGNOSIS — M48062 Spinal stenosis, lumbar region with neurogenic claudication: Secondary | ICD-10-CM | POA: Diagnosis not present

## 2020-06-06 DIAGNOSIS — E7800 Pure hypercholesterolemia, unspecified: Secondary | ICD-10-CM | POA: Diagnosis not present

## 2020-06-06 DIAGNOSIS — M5441 Lumbago with sciatica, right side: Secondary | ICD-10-CM | POA: Diagnosis not present

## 2020-06-25 DIAGNOSIS — M47816 Spondylosis without myelopathy or radiculopathy, lumbar region: Secondary | ICD-10-CM | POA: Diagnosis not present

## 2020-07-25 DIAGNOSIS — Z23 Encounter for immunization: Secondary | ICD-10-CM | POA: Diagnosis not present

## 2020-08-06 DIAGNOSIS — Z23 Encounter for immunization: Secondary | ICD-10-CM | POA: Diagnosis not present

## 2020-08-31 ENCOUNTER — Other Ambulatory Visit: Payer: Self-pay | Admitting: Physician Assistant

## 2020-09-05 DIAGNOSIS — L819 Disorder of pigmentation, unspecified: Secondary | ICD-10-CM | POA: Diagnosis not present

## 2020-09-05 DIAGNOSIS — L570 Actinic keratosis: Secondary | ICD-10-CM | POA: Diagnosis not present

## 2020-09-21 ENCOUNTER — Other Ambulatory Visit: Payer: Self-pay | Admitting: Physician Assistant

## 2020-10-25 DIAGNOSIS — M47816 Spondylosis without myelopathy or radiculopathy, lumbar region: Secondary | ICD-10-CM | POA: Diagnosis not present

## 2020-11-18 ENCOUNTER — Other Ambulatory Visit: Payer: Self-pay | Admitting: Physician Assistant

## 2020-11-18 MED ORDER — LISINOPRIL 20 MG PO TABS: 40.0000 mg | ORAL_TABLET | Freq: Two times a day (BID) | ORAL | 1 refills | Status: DC

## 2020-11-19 ENCOUNTER — Telehealth: Payer: Self-pay | Admitting: Interventional Cardiology

## 2020-11-19 NOTE — Telephone Encounter (Signed)
I spoke with patient. He reports BP was 198/110 this AM. This was about 15 minutes after taking AM medications.  BP was 170/70 yesterday afternoon. Prior to this he had not been checking on a regular basis at home but states it has been more elevated than normal at recent office visits and when he has checked at home.  Not sure if his cuff is accurate. Heart rate 60-70.  No chest pain.  Does have shortness of breath for the last several months.  Some episodes of sweating also. He requests appointment sooner than planned recall. I scheduled patient to see Dr Irish Lack tomorrow-11/20/20 at 2:00. He will bring his BP cuff to this appointment

## 2020-11-19 NOTE — Telephone Encounter (Signed)
Pt c/o BP issue: STAT if pt c/o blurred vision, one-sided weakness or slurred speech  1. What are your last 5 BP readings? 198/110 HR 70  2. Are you having any other symptoms (ex. Dizziness, headache, blurred vision, passed out)? No.  3. What is your BP issue? Patient states that his BP has been elevated and wanted to speak to Dr. Hassell Done nurse about being seen before his recall. Please advise.

## 2020-11-19 NOTE — Progress Notes (Signed)
Cardiology Office Note   Date:  11/20/2020   ID:  Craig Knapp, DOB 1946-09-06, MRN [id number]  PCP:  Lavone Orn, MD    No chief complaint on file.  CAD  Wt Readings from Last 3 Encounters:  11/20/20 222 lb (100.7 kg)  03/29/20 223 lb (101.2 kg)  09/27/19 230 lb (104.3 kg)       History of Present Illness: Craig Knapp is a 75 y.o. male  who had CABG in 2006. SHOB was his primary symptom along with tightnessin his chest. He had a lot of sweating. He never had an MI. Quit smoking after surgery. He usedsome chewing tobacco for a while.  2016 ultrasound showed no AAA.  He also had a wound on his shin.He had an ultrasound at [facility] to check circulation. ..since accident many years ago.  2015 MRI at [facility] showed: 5/4/15MRI Tib/fib Results: (to be scanned in under Media) "There is a rim enhancing cavity in the anterior aspect of the mid right tibia measuring 2.9 x 2.4 x 2 cm with a fistula to the skin surface through a defect in the anterior cortex of the tibia. There are at least 3 definable areas of low signal intensity within the cavity measuring between 4 and 6 mm which I suspect represent bony sequestra.  This is at an area of healed fracture of the mid tibial shaft. The patient dose have focal grade 4 chondromalacia of the tibiotalar joint.   IMPRESSION: -Focal osteomyelitis of the anterior aspect of the mid right tibia with bony sequestra and a communicating fistula to the skin surface."  Wound care has been done.   He had some atypical chest soreness in January 2019.  This resolved.  In November 2020, he experienced some atypical chest pain and dyspnea on exertion.  His blood pressure was also high.  His Zestril was increased to 80 mg daily.  He was referred to the weight loss clinic.  He had a pharmacologic nuclear stress test showing ejection fraction of 61% with likely diaphragmatic attenuation.  It was a low risk study.  He does carry a diagnosis  of emphysema which was documented by CT scan in the past.  He has had some chronic ankle pain, worse in the afternoons.  He has compression stockings.  Since the last visit, he has had some high BP readings at home.  He has continued DOE.  Readings at home were in the 180s range at times.  He felt some    Past Medical History:  Diagnosis Date  . Coronary atherosclerosis of native coronary artery    CABG 2006, EDMUNDS  . DJD (degenerative joint disease) `   back and knees  . Essential hypertension, benign   . History of nuclear stress test    11/08 7 mets, no ischemia, EF 73%  . Male hypogonadism   . Osteomyelitis ([place])    R tibia 1972, recur 2005  . Other and unspecified hyperlipidemia     Past Surgical History:  Procedure Laterality Date  . CORONARY ARTERY BYPASS GRAFT  2006     Current Outpatient Medications  Medication Sig Dispense Refill  . amLODipine (NORVASC) 5 MG tablet Take 1 tablet (5 mg total) by mouth daily. 90 tablet 3  . aspirin 81 MG tablet Take 81 mg by mouth daily.    Marland Kitchen atorvastatin (LIPITOR) 80 MG tablet TAKE ONE TABLET BY MOUTH ONE TIME DAILY 90 tablet 3  . fluticasone (FLONASE) 50 MCG/ACT nasal spray Place into  both nostrils as needed for allergies or rhinitis.    . hydrochlorothiazide (MICROZIDE) 12.5 MG capsule Take 1 capsule (12.5 mg total) by mouth 3 (three) times daily. (Patient taking differently: Take 25 mg by mouth 2 (two) times daily.) 270 capsule 3  . loratadine (CLARITIN) 10 MG tablet Take 10 mg by mouth daily.    . metoprolol tartrate (LOPRESSOR) 25 MG tablet TAKE ONE TABLET BY MOUTH TWICE DAILY 180 tablet 1  . naproxen sodium (ANAPROX) 220 MG tablet Take 220 mg by mouth as needed (PAIN).     . Omega-3 Fatty Acids (FISH OIL) 1000 MG CAPS Take 2 capsules by mouth daily.    Marland Kitchen lisinopril (ZESTRIL) 20 MG tablet Take 1 tablet (20 mg total) by mouth 2 (two) times daily. 180 tablet 3   No current facility-administered medications for this visit.     Allergies:   Patient has no known allergies.    Social History:  The patient  reports that he has never smoked. His smokeless tobacco use includes snuff. He reports current alcohol use of about 7.0 standard drinks of alcohol per week. He reports that he does not use drugs.   Family History:  The patient's family history includes Alcoholism in his father; Heart disease in his mother; Lung cancer in his mother.    ROS:  Please see the history of present illness.   Otherwise, review of systems are positive for .   All other systems are reviewed and negative.    PHYSICAL EXAM: VS:  BP (!) 160/80   Pulse 62   Ht 5\' 8"  (1.727 m)   Wt 222 lb (100.7 kg)   SpO2 98%   BMI 33.75 kg/m  , BMI Body mass index is 33.75 kg/m. GEN: Well nourished, well developed, in no acute distress  HEENT: normal  Neck: no JVD, carotid bruits, or masses Cardiac: RRR; no murmurs, rubs, or gallops,; tr pretibial edema  Respiratory:  clear to auscultation bilaterally, normal work of breathing GI: soft, nontender, nondistended, + BS MS: no deformity or atrophy  Skin: warm and dry, no rash Neuro:  Strength and sensation are intact Psych: euthymic mood, full affect   EKG:   The ekg ordered today demonstrates NSR RBBB, no ST segment changes   Recent Labs: No results found for requested labs within last 8760 hours.   Lipid Panel    Component Value Date/Time   CHOL 141 09/11/2019 0759   TRIG 87 09/11/2019 0759   HDL 84 09/11/2019 0759   CHOLHDL 1.7 09/11/2019 0759   CHOLHDL 1.4 06/24/2016 0738   VLDL 19 06/24/2016 0738   LDLCALC 41 09/11/2019 0759     Other studies Reviewed: Additional studies/ records that were reviewed today with results demonstrating: .   ASSESSMENT AND PLAN:  1. CAD: s/p CABG.  DOE currently.   2. HTN: High readings. Low salt diet. Continue lisinopril 20 mg BID, HCTZ, low dose metoprolol.  Start amlodipine 5 mg daily. Increase exercise. He has a stationary bike.  BP by my  recheck was 160/80, so I think that his home BP cuff is fairly accurate, perhaps 5-10 points higher.   3. Hyperlipidemia: Reduce red meant intake. Continue Atorvastatin.  4. Aortic atherosclerosis: Noted on CT scan in 2020. Continue statin.  5. DOE: COPD noted by CXR.  Chronic DOE.     Current medicines are reviewed at length with the patient today.  The patient concerns regarding his medicines were addressed.  The following changes have been  made:  No change  Labs/ tests ordered today include:  No orders of the defined types were placed in this encounter.   Recommend 150 minutes/week of aerobic exercise Low fat, low carb, high fiber diet recommended  Disposition:   FU in 1 year   Signed, Larae Grooms, MD  11/20/2020 3:11 PM    [facility] [place], [place], [place]  [address] Phone: [phone]; Fax: [phone]

## 2020-11-20 ENCOUNTER — Other Ambulatory Visit: Payer: Self-pay

## 2020-11-20 ENCOUNTER — Encounter: Payer: Self-pay | Admitting: Interventional Cardiology

## 2020-11-20 ENCOUNTER — Ambulatory Visit (INDEPENDENT_AMBULATORY_CARE_PROVIDER_SITE_OTHER): Payer: Medicare Other | Admitting: Interventional Cardiology

## 2020-11-20 VITALS — BP 160/80 | HR 62 | Ht 68.0 in | Wt 222.0 lb

## 2020-11-20 DIAGNOSIS — I700 Atherosclerosis of aorta: Secondary | ICD-10-CM | POA: Diagnosis not present

## 2020-11-20 DIAGNOSIS — E669 Obesity, unspecified: Secondary | ICD-10-CM | POA: Diagnosis not present

## 2020-11-20 DIAGNOSIS — I10 Essential (primary) hypertension: Secondary | ICD-10-CM | POA: Diagnosis not present

## 2020-11-20 DIAGNOSIS — I2510 Atherosclerotic heart disease of native coronary artery without angina pectoris: Secondary | ICD-10-CM | POA: Diagnosis not present

## 2020-11-20 DIAGNOSIS — E782 Mixed hyperlipidemia: Secondary | ICD-10-CM

## 2020-11-20 DIAGNOSIS — I252 Old myocardial infarction: Secondary | ICD-10-CM | POA: Diagnosis not present

## 2020-11-20 MED ORDER — AMLODIPINE BESYLATE 5 MG PO TABS: 5.0000 mg | ORAL_TABLET | Freq: Every day | ORAL | 3 refills | Status: DC

## 2020-11-20 MED ORDER — LISINOPRIL 20 MG PO TABS: 20.0000 mg | ORAL_TABLET | Freq: Two times a day (BID) | ORAL | 3 refills | Status: DC

## 2020-11-20 NOTE — Patient Instructions (Addendum)
Medication Instructions:  Your physician has recommended you make the following change in your medication:  1.) Lisinopril med change  STOP: lisinopril 40mg  daily  START: lisinopril 20mg  twice a day 2.) START:  Amlodipine (Norvasc) 5mg  daily  *If you need a refill on your cardiac medications before your next appointment, please call your pharmacy*   Lab Work: NONE If you have labs (blood work) drawn today and your tests are completely normal, you will receive your results only by: Marland Kitchen MyChart Message (if you have MyChart) OR . A paper copy in the mail If you have any lab test that is abnormal or we need to change your treatment, we will call you to review the results.   Testing/Procedures: NONE   Follow-Up: At [facility], you and your health needs are our priority.  As part of our continuing mission to provide you with exceptional heart care, we have created designated Provider Care Teams.  These Care Teams include your primary Cardiologist (physician) and Advanced Practice Providers (APPs -  Physician Assistants and Nurse Practitioners) who all work together to provide you with the care you need, when you need it.      Your next appointment:        Other Instructions Check Blood pressure daily for 7 days and send readings to provider by MyChart or call in readings [phone]

## 2020-11-21 IMAGING — CT CT CHEST LUNG CANCER SCREENING LOW DOSE
2 of 5 series · 15 of 40 positions shown, 18 images · non-contrast
Comparison: 05/03/2018

CLINICAL DATA: Former smoker. Forty pack-year history.
Asymptomatic.

EXAM:
CT CHEST WITHOUT CONTRAST LOW-DOSE FOR LUNG CANCER SCREENING
TECHNIQUE: Multidetector CT imaging of the chest was performed following the
standard protocol without IV contrast.

[Series 4: lung 1.00 br44 cor · coronal · 0.65mm/px · 3 of 427 slices shown]
[im 86/427  lung]
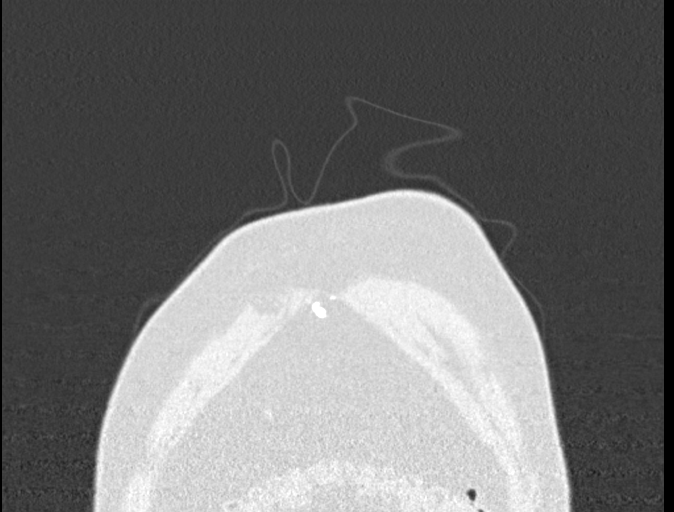
[im 171/427  lung]
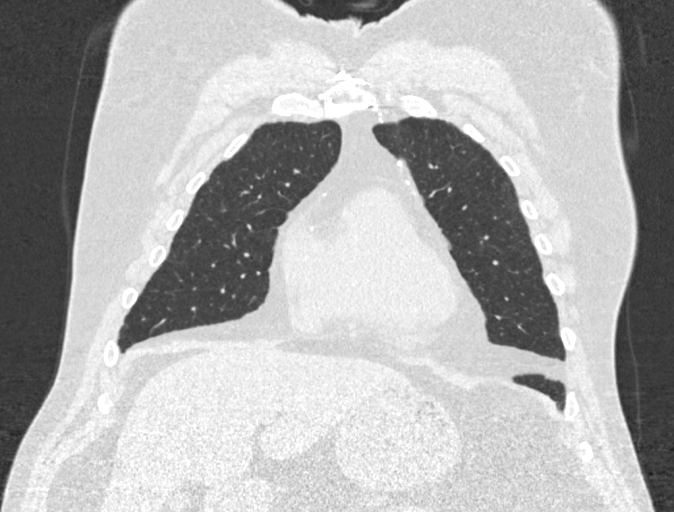
[im 256/427  lung]
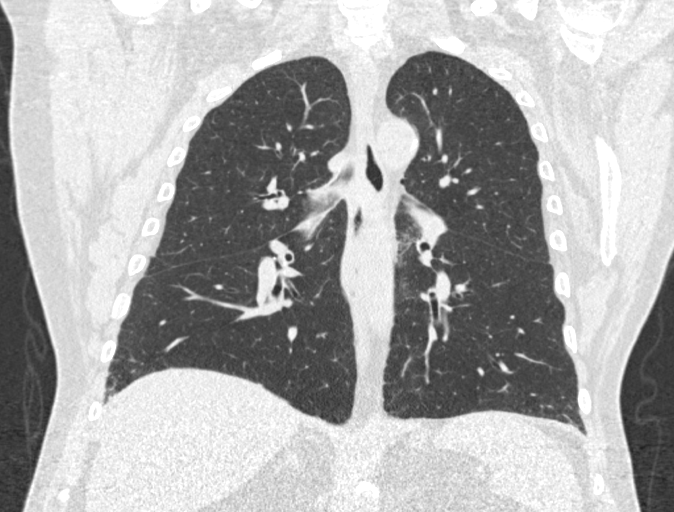

[Series 9: lung 1.00 br60 axial · axial · 0.85mm/px · z∈[-1024,-724]mm · 12 of 331 slices shown, 15 images]
[im 16/331  mediastinal]
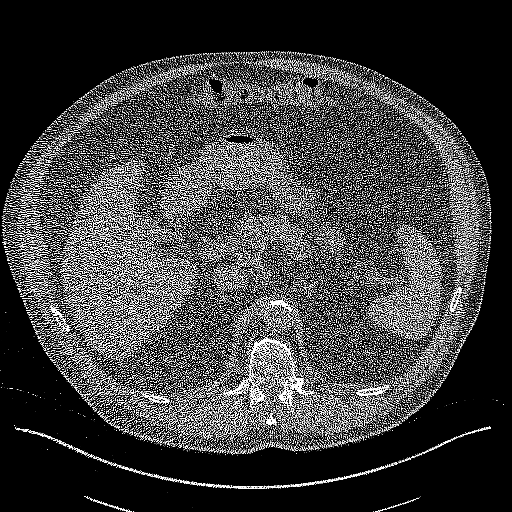
[im 16/331  lung]
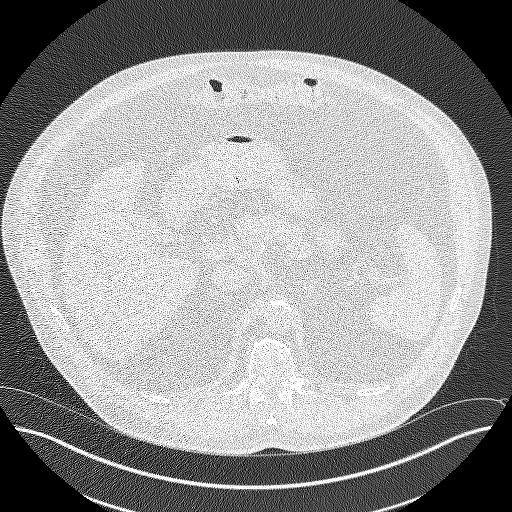
[im 46/331  lung]
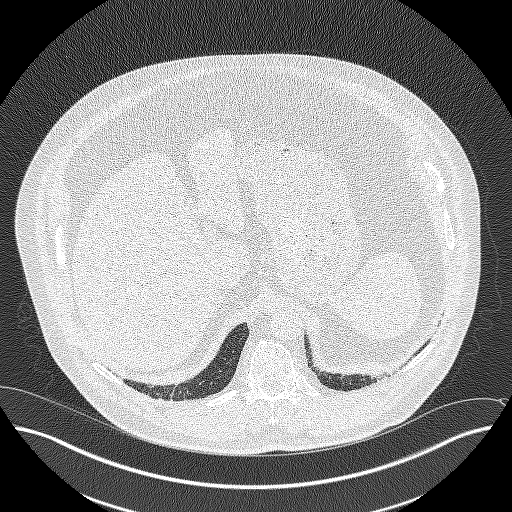
[im 76/331  lung]
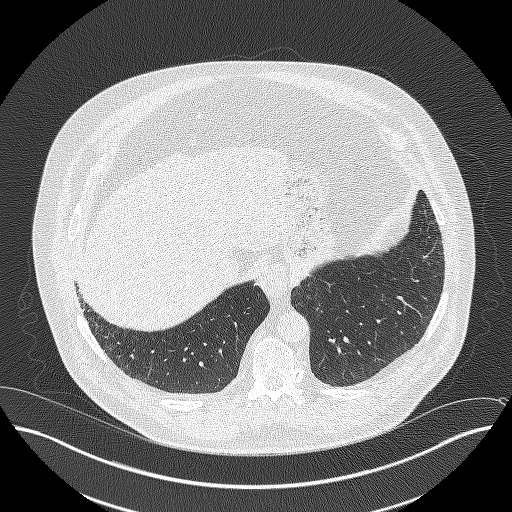
[im 106/331  lung]
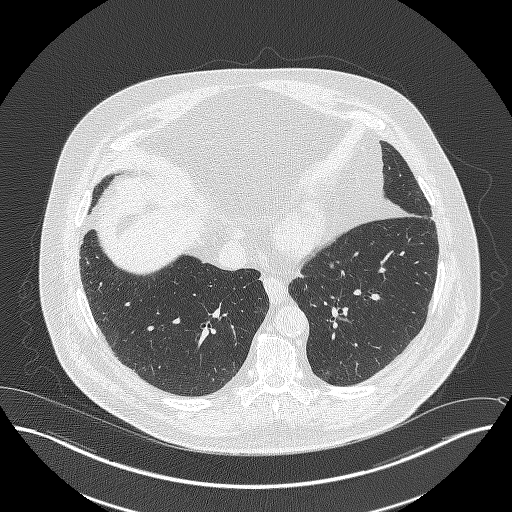
[im 121/331  mediastinal]
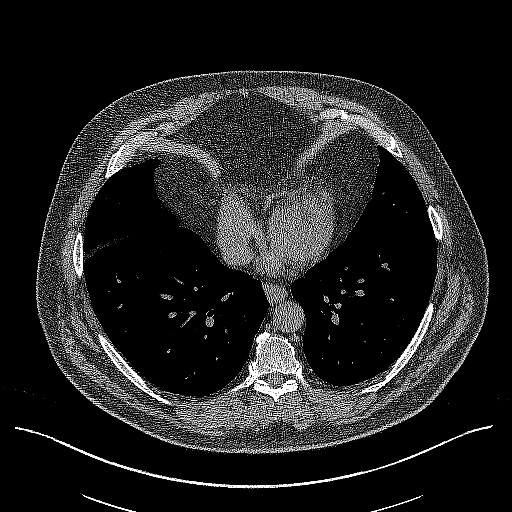
[im 121/331  lung]
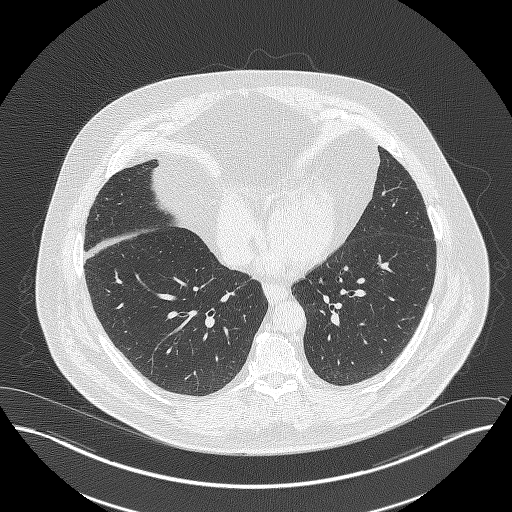
[im 151/331  lung]
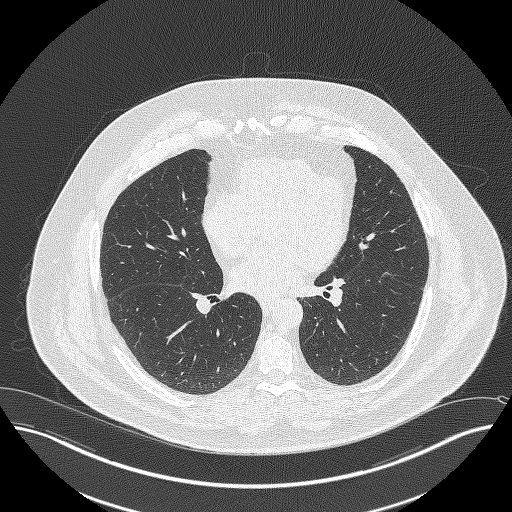
[im 181/331  lung]
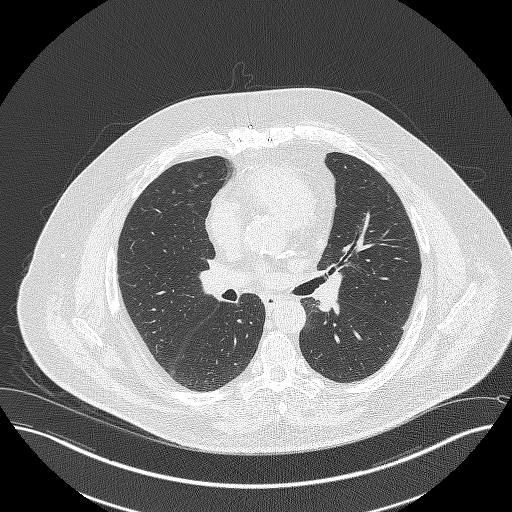
[im 211/331  lung]
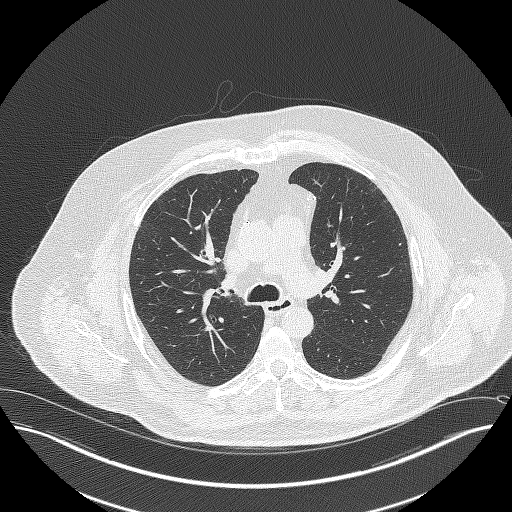
[im 226/331  mediastinal]
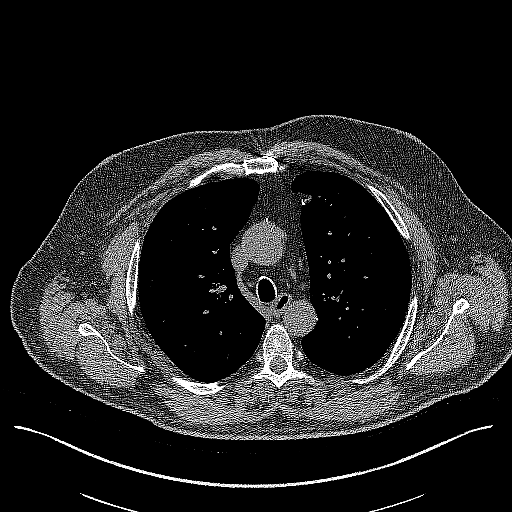
[im 226/331  lung]
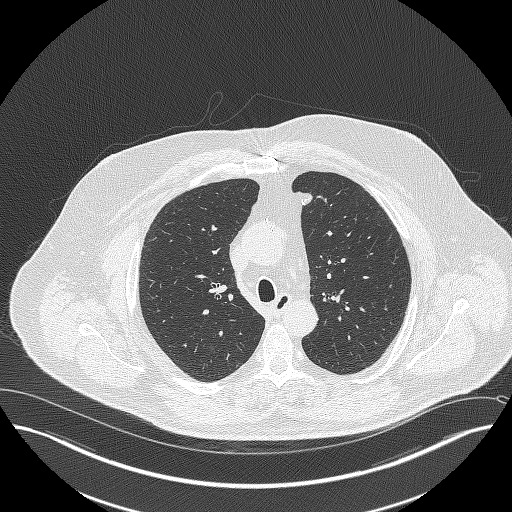
[im 256/331  lung]
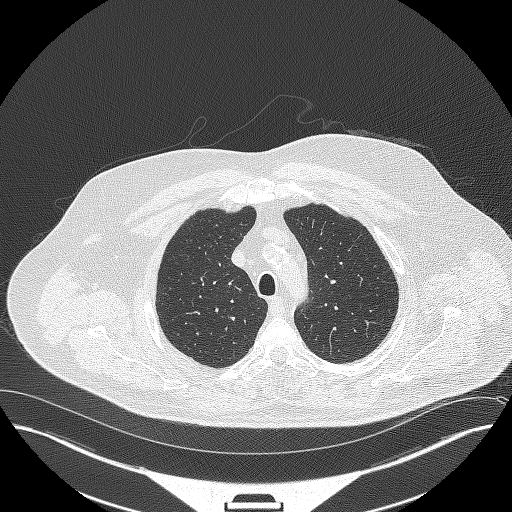
[im 286/331  lung]
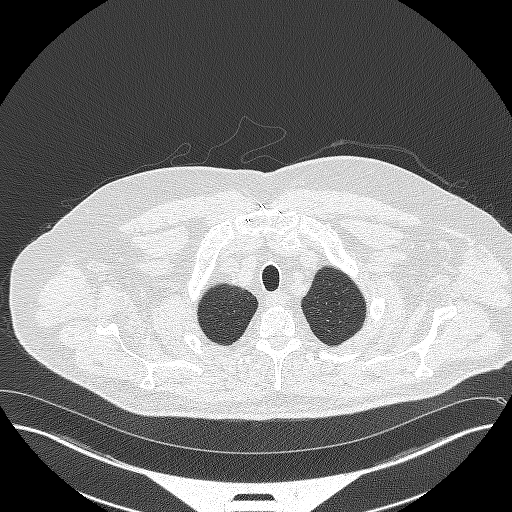
[im 316/331  lung]
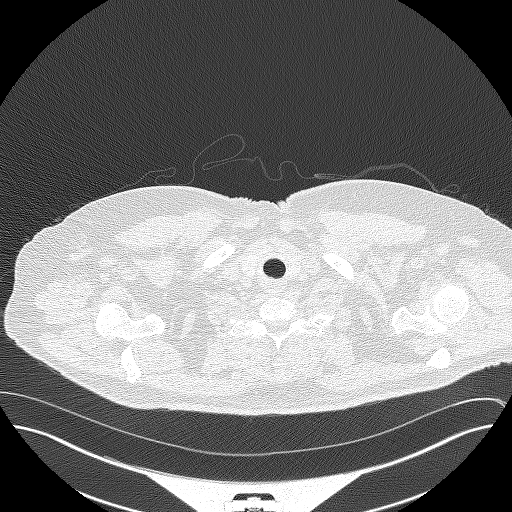

[15 of 40 positions shown; findings below may reference images not displayed]

FINDINGS: Cardiovascular: Heart size appears within normal limits. No
pericardial effusion. Previous median sternotomy and CABG procedure.
Aortic atherosclerosis.

Mediastinum/Nodes: No enlarged mediastinal, hilar, or axillary lymph
nodes. Thyroid gland, trachea, and esophagus demonstrate no
significant findings.

Lungs/Pleura: No pleural effusion. Mild changes of centrilobular
emphysema. No airspace consolidation, atelectasis or pneumothorax.
Calcified and noncalcified pulmonary nodules are identified
bilaterally. These are not significantly changed when compared with
previous exam. The largest nodule is in the medial right lower lobe
with an equivalent diameter of 3.3 mm. No new nodule.

Upper Abdomen: No acute abnormality.

Musculoskeletal: No chest wall mass or suspicious bone lesions
identified.
IMPRESSION: 1. Lung-RADS 2, benign appearance or behavior. Continue annual
screening with low-dose chest CT without contrast in 12 months.
2. Aortic Atherosclerosis (582WN-V32.2) and Emphysema (582WN-5VI.X).

## 2020-11-22 DIAGNOSIS — M47816 Spondylosis without myelopathy or radiculopathy, lumbar region: Secondary | ICD-10-CM | POA: Diagnosis not present

## 2020-11-22 DIAGNOSIS — G894 Chronic pain syndrome: Secondary | ICD-10-CM | POA: Diagnosis not present

## 2020-11-22 DIAGNOSIS — M48062 Spinal stenosis, lumbar region with neurogenic claudication: Secondary | ICD-10-CM | POA: Diagnosis not present

## 2020-11-22 NOTE — Addendum Note (Signed)
Addended by: Jeremy Johann on: 11/22/2020 10:48 AM   Modules accepted: Orders

## 2021-01-05 ENCOUNTER — Other Ambulatory Visit: Payer: Self-pay | Admitting: Physician Assistant

## 2021-03-06 DIAGNOSIS — M47816 Spondylosis without myelopathy or radiculopathy, lumbar region: Secondary | ICD-10-CM | POA: Diagnosis not present

## 2021-03-09 DIAGNOSIS — R0602 Shortness of breath: Secondary | ICD-10-CM | POA: Diagnosis not present

## 2021-03-09 DIAGNOSIS — R42 Dizziness and giddiness: Secondary | ICD-10-CM | POA: Diagnosis not present

## 2021-03-10 DIAGNOSIS — R42 Dizziness and giddiness: Secondary | ICD-10-CM | POA: Diagnosis not present

## 2021-03-10 DIAGNOSIS — I10 Essential (primary) hypertension: Secondary | ICD-10-CM | POA: Diagnosis not present

## 2021-03-18 ENCOUNTER — Other Ambulatory Visit: Payer: Self-pay | Admitting: Interventional Cardiology

## 2021-03-20 NOTE — Progress Notes (Signed)
Cardiology Office Note   Date:  03/21/2021   ID:  Craig Knapp, DOB [birth year], MRN [id number]  PCP:  Craig Orn, MD    No chief complaint on file.  CAD  Wt Readings from Last 3 Encounters:  03/21/21 226 lb (102.5 kg)  11/20/20 222 lb (100.7 kg)  03/29/20 223 lb (101.2 kg)       History of Present Illness: Craig Knapp is a 75 y.o. male  who had CABG in 2006. SHOB was his primary symptom along with tightnessin his chest. He had a lot of sweating. He never had an MI. Quit smoking after surgery. He usedsome chewing tobacco for a while.  2016 ultrasound showed no AAA.  He also had a wound on his shin.He had an ultrasound at [facility] to check circulation. ..since accident many years ago.  2015 MRI at [facility] showed: 5/4/15MRI Tib/fib Results: (to be scanned in under Media) "There is a rim enhancing cavity in the anterior aspect of the mid right tibia measuring 2.9 x 2.4 x 2 cm with a fistula to the skin surface through a defect in the anterior cortex of the tibia. There are at least 3 definable areas of low signal intensity within the cavity measuring between 4 and 6 mm which I suspect represent bony sequestra.  This is at an area of healed fracture of the mid tibial shaft. The patient dose have focal grade 4 chondromalacia of the tibiotalar joint.   IMPRESSION: -Focal osteomyelitis of the anterior aspect of the mid right tibia with bony sequestra and a communicating fistula to the skin surface."  Wound care has been done.  He had some atypical chest soreness in January 2019. This resolved.  In November 2020, he experienced some atypical chest pain and dyspnea on exertion. His blood pressure was also high. His Zestril was increased to 80 mg daily. He was referred to the weight loss clinic. He had a pharmacologic nuclear stress test showing ejection fraction of 61% with likely diaphragmatic attenuation. It was a low risk study. He does carry a diagnosis  of emphysema which was documented by CT scan in the past.  He has had some chronic ankle pain, worse in the afternoons. He has compression stockings.  He had high BP readings in early 2022. Amlodipine was added.    He had a back operation in May 2022.  He was having some lightheadedness.  EMS came to evaluate.  His BP was stable.  ECG was stable.     Past Medical History:  Diagnosis Date  . Coronary atherosclerosis of native coronary artery    CABG 2006, Craig Knapp  . DJD (degenerative joint disease) `   back and knees  . Essential hypertension, benign   . History of nuclear stress test    11/08 7 mets, no ischemia, EF 73%  . Male hypogonadism   . Osteomyelitis ([place])    R tibia 1972, recur 2005  . Other and unspecified hyperlipidemia     Past Surgical History:  Procedure Laterality Date  . CORONARY ARTERY BYPASS GRAFT  2006     Current Outpatient Medications  Medication Sig Dispense Refill  . amLODipine (NORVASC) 5 MG tablet Take 1 tablet (5 mg total) by mouth daily. 90 tablet 3  . aspirin 81 MG tablet Take 81 mg by mouth daily.    Marland Kitchen atorvastatin (LIPITOR) 80 MG tablet TAKE ONE TABLET BY MOUTH ONE TIME DAILY 90 tablet 3  . fluticasone (FLONASE) 50 MCG/ACT nasal spray Place  into both nostrils as needed for allergies or rhinitis.    . hydrochlorothiazide (MICROZIDE) 12.5 MG capsule TAKE ONE CAPSULE BY MOUTH THREE TIMES DAILY 270 capsule 3  . lisinopril (ZESTRIL) 20 MG tablet Take 1 tablet (20 mg total) by mouth 2 (two) times daily. 180 tablet 3  . loratadine (CLARITIN) 10 MG tablet Take 10 mg by mouth daily.    . metoprolol tartrate (LOPRESSOR) 25 MG tablet TAKE ONE TABLET BY MOUTH TWICE DAILY 180 tablet 0  . naproxen sodium (ANAPROX) 220 MG tablet Take 220 mg by mouth as needed (PAIN).     . Omega-3 Fatty Acids (FISH OIL) 1000 MG CAPS Take 2 capsules by mouth daily.     No current facility-administered medications for this visit.    Allergies:   Patient has no known  allergies.    Social History:  The patient  reports that he has never smoked. His smokeless tobacco use includes snuff. He reports current alcohol use of about 7.0 standard drinks of alcohol per week. He reports that he does not use drugs.   Family History:  The patient's family history includes Alcoholism in his father; Heart disease in his mother; Lung cancer in his mother.    ROS:  Please see the history of present illness.   Otherwise, review of systems are positive for dizziness.   All other systems are reviewed and negative.  Left leg and then it was still rated  PHYSICAL EXAM: VS:  BP (!) 148/60   Pulse (!) 56   Ht 5\' 8"  (1.727 m)   Wt 226 lb (102.5 kg)   SpO2 98%   BMI 34.36 kg/m  , BMI Body mass index is 34.36 kg/m. GEN: Well nourished, well developed, in no acute distress  HEENT: normal  Neck: no JVD, carotid bruits, or masses Cardiac: RRR; no murmurs, rubs, or gallops, right leg edema - chronic Respiratory:  clear to auscultation bilaterally, normal work of breathing GI: soft, nontender, nondistended, + BS MS: no deformity or atrophy  Skin: warm and dry, no rash Neuro:  Strength and sensation are intact Psych: euthymic mood, full affect   EKG:   The ekg ordered with EMS in 02/2021 demonstrates NSR, RBBB- he brought in the tracing   Recent Labs: No results found for requested labs within last 8760 hours.   Lipid Panel    Component Value Date/Time   CHOL 141 09/11/2019 0759   TRIG 87 09/11/2019 0759   HDL 84 09/11/2019 0759   CHOLHDL 1.7 09/11/2019 0759   CHOLHDL 1.4 06/24/2016 0738   VLDL 19 06/24/2016 0738   LDLCALC 41 09/11/2019 0759     Other studies Reviewed: Additional studies/ records that were reviewed today with results demonstrating: EMS ECG reviewed, labs reviewed.   ASSESSMENT AND PLAN:  1. CAD: No angina on medical therapy. Continue aggressive secondary prevention. Tolerated recent surgery.  2. HTN: low salt diet.  Exercise target as  noted below.  BP ranges are up to 150s at times if he has ben active.  If he is resting, systolic is 981.  Decrease salt intake.  Decrease alcohol intake.  He drinks several pints a day of beer.  We discussed increasing amlodipine but he would prefer not to.  3. Hyperlipidemia/obesity: LDL 38 in 2021. Whole food, plant based diet.  4. Aortic atherosclerosis: Continue statin.  5. DOE: chronic.  COPD noted by prior CXR. Unchanged.   Current medicines are reviewed at length with the patient today.  The  patient concerns regarding his medicines were addressed.  The following changes have been made:  No change  Labs/ tests ordered today include:  No orders of the defined types were placed in this encounter.   Recommend 150 minutes/week of aerobic exercise Low fat, low carb, high fiber diet recommended  Disposition:   FU in 1 year   Signed, Larae Grooms, MD  03/21/2021 [facility] [place], [place], [place]  [address] Phone: [phone]; Fax: [phone]

## 2021-03-21 ENCOUNTER — Other Ambulatory Visit: Payer: Self-pay

## 2021-03-21 ENCOUNTER — Ambulatory Visit (INDEPENDENT_AMBULATORY_CARE_PROVIDER_SITE_OTHER): Payer: Medicare Other | Admitting: Interventional Cardiology

## 2021-03-21 ENCOUNTER — Encounter: Payer: Self-pay | Admitting: Interventional Cardiology

## 2021-03-21 VITALS — BP 148/60 | HR 56 | Ht 68.0 in | Wt 226.0 lb

## 2021-03-21 DIAGNOSIS — E782 Mixed hyperlipidemia: Secondary | ICD-10-CM

## 2021-03-21 DIAGNOSIS — I2510 Atherosclerotic heart disease of native coronary artery without angina pectoris: Secondary | ICD-10-CM

## 2021-03-21 DIAGNOSIS — Z8709 Personal history of other diseases of the respiratory system: Secondary | ICD-10-CM | POA: Diagnosis not present

## 2021-03-21 DIAGNOSIS — I700 Atherosclerosis of aorta: Secondary | ICD-10-CM

## 2021-03-21 DIAGNOSIS — E669 Obesity, unspecified: Secondary | ICD-10-CM | POA: Diagnosis not present

## 2021-03-21 DIAGNOSIS — I10 Essential (primary) hypertension: Secondary | ICD-10-CM

## 2021-03-21 NOTE — Patient Instructions (Addendum)
Medication Instructions:  Your provider recommends that you continue on your current medications as directed. Please refer to the Current Medication list given to you today.   *If you need a refill on your cardiac medications before your next appointment, please call your pharmacy*  Follow-Up: At [facility], you and your health needs are our priority.  As part of our continuing mission to provide you with exceptional heart care, we have created designated Provider Care Teams.  These Care Teams include your primary Cardiologist (physician) and Advanced Practice Providers (APPs -  Physician Assistants and Nurse Practitioners) who all work together to provide you with the care you need, when you need it. Your next appointment:   12 month(s) The format for your next appointment:   In Person Provider:   You may see Larae Grooms, MD or one of the following Advanced Practice Providers on your designated Care Team:    Melina Copa, PA-C  Ermalinda Barrios, PA-C    Low-Sodium Eating Plan Sodium, which is an element that makes up salt, helps you maintain a healthy balance of fluids in your body. Too much sodium can increase your blood pressure and cause fluid and waste to be held in your body. Your health care provider or dietitian may recommend following this plan if you have high blood pressure (hypertension), kidney disease, liver disease, or heart failure. Eating less sodium can help lower your blood pressure, reduce swelling, and protect your heart, liver, and kidneys. What are tips for following this plan? Reading food labels  The Nutrition Facts label lists the amount of sodium in one serving of the food. If you eat more than one serving, you must multiply the listed amount of sodium by the number of servings.  Choose foods with less than 140 mg of sodium per serving.  Avoid foods with 300 mg of sodium or more per serving. Shopping  Look for lower-sodium products, often labeled as  "low-sodium" or "no salt added."  Always check the sodium content, even if foods are labeled as "unsalted" or "no salt added."  Buy fresh foods. ? Avoid canned foods and pre-made or frozen meals. ? Avoid canned, cured, or processed meats.  Buy breads that have less than 80 mg of sodium per slice.   Cooking  Eat more home-cooked food and less restaurant, buffet, and fast food.  Avoid adding salt when cooking. Use salt-free seasonings or herbs instead of table salt or sea salt. Check with your health care provider or pharmacist before using salt substitutes.  Cook with plant-based oils, such as canola, sunflower, or olive oil.   Meal planning  When eating at a restaurant, ask that your food be prepared with less salt or no salt, if possible. Avoid dishes labeled as brined, pickled, cured, smoked, or made with soy sauce, miso, or teriyaki sauce.  Avoid foods that contain MSG (monosodium glutamate). MSG is sometimes added to [place] food, bouillon, and some canned foods.  Make meals that can be grilled, baked, poached, roasted, or steamed. These are generally made with less sodium. General information Most people on this plan should limit their sodium intake to 1,500-2,000 mg (milligrams) of sodium each day. What foods should I eat? Fruits Fresh, frozen, or canned fruit. Fruit juice. Vegetables Fresh or frozen vegetables. "No salt added" canned vegetables. "No salt added" tomato sauce and paste. Low-sodium or reduced-sodium tomato and vegetable juice. Grains Low-sodium cereals, including oats, puffed wheat and rice, and shredded wheat. Low-sodium crackers. Unsalted rice. Unsalted pasta.  Low-sodium bread. Whole-grain breads and whole-grain pasta. Meats and other proteins Fresh or frozen (no salt added) meat, poultry, seafood, and fish. Low-sodium canned tuna and salmon. Unsalted nuts. Dried peas, beans, and lentils without added salt. Unsalted canned beans. Eggs. Unsalted nut  butters. Dairy Milk. Soy milk. Cheese that is naturally low in sodium, such as ricotta cheese, fresh mozzarella, or Swiss cheese. Low-sodium or reduced-sodium cheese. Cream cheese. Yogurt. Seasonings and condiments Fresh and dried herbs and spices. Salt-free seasonings. Low-sodium mustard and ketchup. Sodium-free salad dressing. Sodium-free light mayonnaise. Fresh or refrigerated horseradish. Lemon juice. Vinegar. Other foods Homemade, reduced-sodium, or low-sodium soups. Unsalted popcorn and pretzels. Low-salt or salt-free chips. The items listed above may not be a complete list of foods and beverages you can eat. Contact a dietitian for more information. What foods should I avoid? Vegetables Sauerkraut, pickled vegetables, and relishes. Olives. [place] fries. Onion rings. Regular canned vegetables (not low-sodium or reduced-sodium). Regular canned tomato sauce and paste (not low-sodium or reduced-sodium). Regular tomato and vegetable juice (not low-sodium or reduced-sodium). Frozen vegetables in sauces. Grains Instant hot cereals. Bread stuffing, pancake, and biscuit mixes. Croutons. Seasoned rice or pasta mixes. Noodle soup cups. Boxed or frozen macaroni and cheese. Regular salted crackers. Self-rising flour. Meats and other proteins Meat or fish that is salted, canned, smoked, spiced, or pickled. Precooked or cured meat, such as sausages or meat loaves. Berniece Salines. Ham. Pepperoni. Hot dogs. Corned beef. Chipped beef. Salt pork. Jerky. Pickled herring. Anchovies and sardines. Regular canned tuna. Salted nuts. Dairy Processed cheese and cheese spreads. Hard cheeses. Cheese curds. Blue cheese. Feta cheese. String cheese. Regular cottage cheese. Buttermilk. Canned milk. Fats and oils Salted butter. Regular margarine. Ghee. Bacon fat. Seasonings and condiments Onion salt, garlic salt, seasoned salt, table salt, and sea salt. Canned and packaged gravies. Worcestershire sauce. Tartar sauce. Barbecue  sauce. Teriyaki sauce. Soy sauce, including reduced-sodium. Steak sauce. Fish sauce. Oyster sauce. Cocktail sauce. Horseradish that you find on the shelf. Regular ketchup and mustard. Meat flavorings and tenderizers. Bouillon cubes. Hot sauce. Pre-made or packaged marinades. Pre-made or packaged taco seasonings. Relishes. Regular salad dressings. Salsa. Other foods Salted popcorn and pretzels. Corn chips and puffs. Potato and tortilla chips. Canned or dried soups. Pizza. Frozen entrees and pot pies. The items listed above may not be a complete list of foods and beverages you should avoid. Contact a dietitian for more information. Summary  Eating less sodium can help lower your blood pressure, reduce swelling, and protect your heart, liver, and kidneys.  Most people on this plan should limit their sodium intake to 1,500-2,000 mg (milligrams) of sodium each day.  Canned, boxed, and frozen foods are high in sodium. Restaurant foods, fast foods, and pizza are also very high in sodium. You also get sodium by adding salt to food.  Try to cook at home, eat more fresh fruits and vegetables, and eat less fast food and canned, processed, or prepared foods. This information is not intended to replace advice given to you by your health care provider. Make sure you discuss any questions you have with your health care provider. Document Revised: 11/10/2019 Document Reviewed: 09/06/2019 Elsevier Patient Education  2021 [employer].

## 2021-03-26 ENCOUNTER — Ambulatory Visit: Payer: Medicare Other | Admitting: Interventional Cardiology

## 2021-06-09 DIAGNOSIS — I2510 Atherosclerotic heart disease of native coronary artery without angina pectoris: Secondary | ICD-10-CM | POA: Diagnosis not present

## 2021-06-09 DIAGNOSIS — Z86010 Personal history of colonic polyps: Secondary | ICD-10-CM | POA: Diagnosis not present

## 2021-06-09 DIAGNOSIS — E871 Hypo-osmolality and hyponatremia: Secondary | ICD-10-CM | POA: Diagnosis not present

## 2021-06-09 DIAGNOSIS — Z0000 Encounter for general adult medical examination without abnormal findings: Secondary | ICD-10-CM | POA: Diagnosis not present

## 2021-06-09 DIAGNOSIS — Z1389 Encounter for screening for other disorder: Secondary | ICD-10-CM | POA: Diagnosis not present

## 2021-06-09 DIAGNOSIS — I10 Essential (primary) hypertension: Secondary | ICD-10-CM | POA: Diagnosis not present

## 2021-06-09 DIAGNOSIS — M86669 Other chronic osteomyelitis, unspecified tibia and fibula: Secondary | ICD-10-CM | POA: Diagnosis not present

## 2021-06-09 DIAGNOSIS — E7800 Pure hypercholesterolemia, unspecified: Secondary | ICD-10-CM | POA: Diagnosis not present

## 2021-06-15 ENCOUNTER — Other Ambulatory Visit: Payer: Self-pay | Admitting: Interventional Cardiology

## 2021-06-26 DIAGNOSIS — E871 Hypo-osmolality and hyponatremia: Secondary | ICD-10-CM | POA: Diagnosis not present

## 2021-07-02 ENCOUNTER — Telehealth: Payer: Self-pay | Admitting: Interventional Cardiology

## 2021-07-02 NOTE — Telephone Encounter (Signed)
I spoke with patient.  He is calling to update Dr Irish Lack. Patient reports his sodium was low recently and Dr Laurann Montana stopped his HCTZ.  His sodium has improved and Dr Delene Ruffini office advised him to remain off HCTZ but to call them if he has swelling or BP becomes elevated.  Patient reports BP has been running higher recently.  I asked him to contact Dr Delene Ruffini office as they had made most recent medication changes.  Patient agreeable to this and will contact office today.

## 2021-07-02 NOTE — Telephone Encounter (Signed)
Pt c/o medication issue:  1. Name of Medication: hydrochlorothiazide (MICROZIDE) 12.5 MG capsule  2. How are you currently taking this medication (dosage and times per day)? Patient has not taken since 06/09/21  3. Are you having a reaction (difficulty breathing--STAT)?   4. What is your medication issue? Patient wanted to confirm with Dr. Irish Lack that it is OK that he stopped this medication.   The patient's PCP took him off of the medication when his sodium was low. The patient's sodium has come up and his PCP told him to watch his BP.   The patient just wanted to check with Dr. Irish Lack about what to do

## 2021-07-29 DIAGNOSIS — Z23 Encounter for immunization: Secondary | ICD-10-CM | POA: Diagnosis not present

## 2021-08-19 DIAGNOSIS — M48062 Spinal stenosis, lumbar region with neurogenic claudication: Secondary | ICD-10-CM | POA: Diagnosis not present

## 2021-08-19 DIAGNOSIS — M47816 Spondylosis without myelopathy or radiculopathy, lumbar region: Secondary | ICD-10-CM | POA: Diagnosis not present

## 2021-08-28 DIAGNOSIS — M5125 Other intervertebral disc displacement, thoracolumbar region: Secondary | ICD-10-CM | POA: Diagnosis not present

## 2021-08-28 DIAGNOSIS — M47816 Spondylosis without myelopathy or radiculopathy, lumbar region: Secondary | ICD-10-CM | POA: Diagnosis not present

## 2021-08-28 DIAGNOSIS — M5136 Other intervertebral disc degeneration, lumbar region: Secondary | ICD-10-CM | POA: Diagnosis not present

## 2021-08-28 DIAGNOSIS — M48062 Spinal stenosis, lumbar region with neurogenic claudication: Secondary | ICD-10-CM | POA: Diagnosis not present

## 2021-08-28 DIAGNOSIS — M47817 Spondylosis without myelopathy or radiculopathy, lumbosacral region: Secondary | ICD-10-CM | POA: Diagnosis not present

## 2021-09-08 DIAGNOSIS — M47816 Spondylosis without myelopathy or radiculopathy, lumbar region: Secondary | ICD-10-CM | POA: Diagnosis not present

## 2021-09-14 ENCOUNTER — Other Ambulatory Visit: Payer: Self-pay | Admitting: Physician Assistant

## 2021-10-06 ENCOUNTER — Telehealth: Payer: Self-pay | Admitting: Interventional Cardiology

## 2021-10-06 DIAGNOSIS — M48062 Spinal stenosis, lumbar region with neurogenic claudication: Secondary | ICD-10-CM | POA: Diagnosis not present

## 2021-10-06 DIAGNOSIS — I77819 Aortic ectasia, unspecified site: Secondary | ICD-10-CM

## 2021-10-06 DIAGNOSIS — G894 Chronic pain syndrome: Secondary | ICD-10-CM | POA: Diagnosis not present

## 2021-10-06 DIAGNOSIS — M47816 Spondylosis without myelopathy or radiculopathy, lumbar region: Secondary | ICD-10-CM | POA: Diagnosis not present

## 2021-10-06 DIAGNOSIS — I700 Atherosclerosis of aorta: Secondary | ICD-10-CM

## 2021-10-06 NOTE — Telephone Encounter (Signed)
Patient is asking that nurse called him so that he can update her on things.

## 2021-10-06 NOTE — Telephone Encounter (Signed)
I spoke with patient. He had MRI at [facility] on 11/10 and was advised to make Dr Irish Lack aware of incidental finding-  MRI showed focal ectasia of infrarenal abdominal aorta measuring up to 2.6 cm.  Report is available in Care Everywhere.    I

## 2021-10-08 NOTE — Telephone Encounter (Signed)
Patient notified

## 2021-10-08 NOTE — Telephone Encounter (Signed)
Plan for abdominal aortic ultrasound in 1 year for aortic atherosclerosis, aortic ectasia.

## 2021-11-09 ENCOUNTER — Other Ambulatory Visit: Payer: Self-pay | Admitting: Interventional Cardiology

## 2021-12-02 DIAGNOSIS — Z87891 Personal history of nicotine dependence: Secondary | ICD-10-CM | POA: Diagnosis not present

## 2021-12-02 DIAGNOSIS — I2510 Atherosclerotic heart disease of native coronary artery without angina pectoris: Secondary | ICD-10-CM | POA: Diagnosis not present

## 2021-12-02 DIAGNOSIS — I119 Hypertensive heart disease without heart failure: Secondary | ICD-10-CM | POA: Diagnosis not present

## 2021-12-02 DIAGNOSIS — M48062 Spinal stenosis, lumbar region with neurogenic claudication: Secondary | ICD-10-CM | POA: Diagnosis not present

## 2021-12-02 DIAGNOSIS — E785 Hyperlipidemia, unspecified: Secondary | ICD-10-CM | POA: Diagnosis not present

## 2021-12-02 DIAGNOSIS — I10 Essential (primary) hypertension: Secondary | ICD-10-CM | POA: Diagnosis not present

## 2021-12-02 DIAGNOSIS — Z006 Encounter for examination for normal comparison and control in clinical research program: Secondary | ICD-10-CM | POA: Diagnosis not present

## 2021-12-02 DIAGNOSIS — M545 Low back pain, unspecified: Secondary | ICD-10-CM | POA: Diagnosis not present

## 2021-12-02 DIAGNOSIS — Z951 Presence of aortocoronary bypass graft: Secondary | ICD-10-CM | POA: Diagnosis not present

## 2021-12-02 DIAGNOSIS — Z79899 Other long term (current) drug therapy: Secondary | ICD-10-CM | POA: Diagnosis not present

## 2021-12-02 DIAGNOSIS — Z7982 Long term (current) use of aspirin: Secondary | ICD-10-CM | POA: Diagnosis not present

## 2021-12-02 DIAGNOSIS — E669 Obesity, unspecified: Secondary | ICD-10-CM | POA: Diagnosis not present

## 2021-12-02 DIAGNOSIS — M549 Dorsalgia, unspecified: Secondary | ICD-10-CM | POA: Diagnosis not present

## 2021-12-02 DIAGNOSIS — Z6834 Body mass index (BMI) 34.0-34.9, adult: Secondary | ICD-10-CM | POA: Diagnosis not present

## 2021-12-02 DIAGNOSIS — G894 Chronic pain syndrome: Secondary | ICD-10-CM | POA: Diagnosis not present

## 2021-12-09 DIAGNOSIS — Z79899 Other long term (current) drug therapy: Secondary | ICD-10-CM | POA: Diagnosis not present

## 2021-12-09 DIAGNOSIS — Z5181 Encounter for therapeutic drug level monitoring: Secondary | ICD-10-CM | POA: Diagnosis not present

## 2021-12-16 DIAGNOSIS — H35033 Hypertensive retinopathy, bilateral: Secondary | ICD-10-CM | POA: Diagnosis not present

## 2022-01-06 DIAGNOSIS — M48062 Spinal stenosis, lumbar region with neurogenic claudication: Secondary | ICD-10-CM | POA: Diagnosis not present

## 2022-01-06 DIAGNOSIS — M47816 Spondylosis without myelopathy or radiculopathy, lumbar region: Secondary | ICD-10-CM | POA: Diagnosis not present

## 2022-01-06 DIAGNOSIS — G894 Chronic pain syndrome: Secondary | ICD-10-CM | POA: Diagnosis not present

## 2022-01-12 DIAGNOSIS — C44321 Squamous cell carcinoma of skin of nose: Secondary | ICD-10-CM | POA: Diagnosis not present

## 2022-01-12 DIAGNOSIS — Z85828 Personal history of other malignant neoplasm of skin: Secondary | ICD-10-CM | POA: Diagnosis not present

## 2022-01-12 DIAGNOSIS — L570 Actinic keratosis: Secondary | ICD-10-CM | POA: Diagnosis not present

## 2022-01-12 DIAGNOSIS — D485 Neoplasm of uncertain behavior of skin: Secondary | ICD-10-CM | POA: Diagnosis not present

## 2022-01-12 DIAGNOSIS — L814 Other melanin hyperpigmentation: Secondary | ICD-10-CM | POA: Diagnosis not present

## 2022-01-12 DIAGNOSIS — D1801 Hemangioma of skin and subcutaneous tissue: Secondary | ICD-10-CM | POA: Diagnosis not present

## 2022-01-12 DIAGNOSIS — L821 Other seborrheic keratosis: Secondary | ICD-10-CM | POA: Diagnosis not present

## 2022-01-12 DIAGNOSIS — Z08 Encounter for follow-up examination after completed treatment for malignant neoplasm: Secondary | ICD-10-CM | POA: Diagnosis not present

## 2022-02-10 ENCOUNTER — Telehealth: Payer: Self-pay | Admitting: Interventional Cardiology

## 2022-02-10 NOTE — Telephone Encounter (Signed)
Patient called to update his pharmacy.  His new pharmacy is [facility] at [address] [place], [place] [address]. ?

## 2022-02-10 NOTE — Telephone Encounter (Signed)
Pharmacy has been updated.

## 2022-02-13 ENCOUNTER — Encounter: Payer: Self-pay | Admitting: Interventional Cardiology

## 2022-02-13 ENCOUNTER — Other Ambulatory Visit: Payer: Self-pay

## 2022-02-13 MED ORDER — LISINOPRIL 20 MG PO TABS: 20.0000 mg | ORAL_TABLET | Freq: Two times a day (BID) | ORAL | 0 refills | Status: DC

## 2022-02-13 MED ORDER — AMLODIPINE BESYLATE 5 MG PO TABS: 5.0000 mg | ORAL_TABLET | Freq: Every day | ORAL | 0 refills | Status: DC

## 2022-02-13 MED ORDER — METOPROLOL TARTRATE 25 MG PO TABS: 25.0000 mg | ORAL_TABLET | Freq: Two times a day (BID) | ORAL | 0 refills | Status: DC

## 2022-02-13 MED ORDER — ATORVASTATIN CALCIUM 80 MG PO TABS: 80.0000 mg | ORAL_TABLET | Freq: Every day | ORAL | 0 refills | Status: DC

## 2022-02-27 DIAGNOSIS — C4492 Squamous cell carcinoma of skin, unspecified: Secondary | ICD-10-CM | POA: Diagnosis not present

## 2022-02-27 DIAGNOSIS — C44321 Squamous cell carcinoma of skin of nose: Secondary | ICD-10-CM | POA: Diagnosis not present

## 2022-02-27 DIAGNOSIS — Z481 Encounter for planned postprocedural wound closure: Secondary | ICD-10-CM | POA: Diagnosis not present

## 2022-03-05 NOTE — Progress Notes (Signed)
Cardiology Office Note   Date:  03/06/2022   ID:  [name], DOB 10-15-1946, MRN [id number]  PCP:  Lavone Orn, MD    Chief Complaint  Patient presents with   Follow-up   CAD  Wt Readings from Last 3 Encounters:  03/06/22 231 lb (104.8 kg)  03/21/21 226 lb (102.5 kg)  11/20/20 222 lb (100.7 kg)       History of Present Illness: Luisfernando Brightwell is a 76 y.o. male   who had CABG in 2006. SHOB was his primary symptom along with tightness in his chest.  He had a lot of sweating. He never had an MI. Quit smoking after surgery. He used some chewing tobacco for a while.   2016 ultrasound showed no AAA.   He also had a wound on his shin. He had an ultrasound at [facility] to check circulation. ..since accident many years ago.   2015 MRI at [facility] showed: 02/19/14 MRI Tib/fib Results: (to be scanned in under Media) "There is a rim enhancing cavity in the anterior aspect of the mid right tibia measuring 2.9 x 2.4 x 2 cm with a fistula to the skin surface through a defect in the anterior cortex of the tibia. There are at least 3 definable areas of low signal intensity within the cavity measuring between 4 and 6 mm which I suspect represent bony sequestra.  This is at an area of healed fracture of the mid tibial shaft. The patient dose have focal grade 4 chondromalacia of the tibiotalar joint.   IMPRESSION: -Focal osteomyelitis of the anterior aspect of the mid right tibia with bony sequestra and a communicating fistula to the skin surface."   Wound care has been done.    He had some atypical chest soreness in January 2019.  This resolved.   In November 2020, he experienced some atypical chest pain and dyspnea on exertion.  His blood pressure was also high.  His Zestril was increased to 80 mg daily.  He was referred to the weight loss clinic.  He had a pharmacologic nuclear stress test showing ejection fraction of 61% with likely diaphragmatic attenuation.  It was a low risk  study.  He does carry a diagnosis of emphysema which was documented by CT scan in the past.   He has had some chronic ankle pain, worse in the afternoons.  He has compression stockings.   He had high BP readings in early 2022. Amlodipine was added.      He had a back operation in May 2022.  He was having some lightheadedness.  EMS came to evaluate.  His BP was stable.  ECG was stable.   In 2/23 had MILD surgery- Minimally invasive lumbar decompression.    Had skin cancer remoal in 5/23.  Denies : Chest pain. Dizziness. Leg edema. Nitroglycerin use. Orthopnea. Palpitations. Paroxysmal nocturnal dyspnea. Shortness of breath. Syncope.     Infrarenal ectasia- up to 2.6 cm.   Past Medical History:  Diagnosis Date   Coronary atherosclerosis of native coronary artery    CABG 2006, EDMUNDS   DJD (degenerative joint disease) `   back and knees   Essential hypertension, benign    History of nuclear stress test    11/08 7 mets, no ischemia, EF 73%   Male hypogonadism    Osteomyelitis ([place])    R tibia 1972, recur 2005   Other and unspecified hyperlipidemia     Past Surgical History:  Procedure Laterality Date   CORONARY  ARTERY BYPASS GRAFT  2006     Current Outpatient Medications  Medication Sig Dispense Refill   amLODipine (NORVASC) 5 MG tablet Take 1 tablet (5 mg total) by mouth daily. 90 tablet 0   aspirin 81 MG tablet Take 81 mg by mouth daily.     atorvastatin (LIPITOR) 80 MG tablet Take 1 tablet (80 mg total) by mouth daily. 90 tablet 0   fluticasone (FLONASE) 50 MCG/ACT nasal spray Place into both nostrils as needed for allergies or rhinitis.     lisinopril (ZESTRIL) 20 MG tablet Take 1 tablet (20 mg total) by mouth 2 (two) times daily. 180 tablet 0   loratadine (CLARITIN) 10 MG tablet Take 10 mg by mouth daily.     metoprolol tartrate (LOPRESSOR) 25 MG tablet Take 1 tablet (25 mg total) by mouth 2 (two) times daily. 180 tablet 0   naproxen sodium (ANAPROX) 220 MG tablet  Take 220 mg by mouth as needed (PAIN).      Omega-3 Fatty Acids (FISH OIL) 1000 MG CAPS Take 2 capsules by mouth daily.     No current facility-administered medications for this visit.    Allergies:   Patient has no known allergies.    Social History:  The patient  reports that he has never smoked. His smokeless tobacco use includes snuff. He reports current alcohol use of about 7.0 standard drinks per week. He reports that he does not use drugs.   Family History:  The patient's family history includes Alcoholism in his father; Heart disease in his mother; Lung cancer in his mother.    ROS:  Please see the history of present illness.   Otherwise, review of systems are positive for feet tingling.   All other systems are reviewed and negative.    PHYSICAL EXAM: VS:  BP 140/70   Pulse (!) 50   Ht '5\' 8"'$  (1.727 m)   Wt 231 lb (104.8 kg)   SpO2 98%   BMI 35.12 kg/m  , BMI Body mass index is 35.12 kg/m. GEN: Well nourished, well developed, in no acute distress HEENT: apparent pus in right ear canal; bandage on nose Neck: no JVD, carotid bruits, or masses Cardiac: RRR; no murmurs, rubs, or gallops,no edema  Respiratory:  clear to auscultation bilaterally, normal work of breathing GI: soft, nontender, nondistended, + BS MS: no deformity or atrophy Skin: warm and dry, no rash Neuro:  Strength and sensation are intact Psych: euthymic mood, full affect   EKG:   The ekg ordered today demonstrates SB, IRBBB, no ST changes   Recent Labs: No results found for requested labs within last 8760 hours.   Lipid Panel    Component Value Date/Time   CHOL 141 09/11/2019 0759   TRIG 87 09/11/2019 0759   HDL 84 09/11/2019 0759   CHOLHDL 1.7 09/11/2019 0759   CHOLHDL 1.4 06/24/2016 0738   VLDL 19 06/24/2016 0738   LDLCALC 41 09/11/2019 0759     Other studies Reviewed: Additional studies/ records that were reviewed today with results demonstrating: labs reviewed.   ASSESSMENT AND  PLAN:  CAD: No angina on medical therapy. COntinue aggressive secondary prevention.  Increase activity as his back will allow. HTN: The current medical regimen is effective;  continue present plan and medications.  Some readings in the 140s typically.  Stopping metoprolol due to beta blocker, increase amlodipine 10 mg daily.  He will continue to monitor readings at home.  He will let us know if readings are consistently  over 010 systolic. Hyperlipidemia/obesity: LDL 33.  Continue high potency lipid-lowering therapy.  Taking atorvastatin 80 mg daily.  Healthy diet recommended.  Avoid processed foods.  Low-salt diet.  High-fiber diet. Aortic atherosclerosis: Aortic size 2.6 cm.  DOE: stable.  Recent skin cancer surgery.  Skin graft taken from the inside of his right ear canal.  Now there appears to be some pus there.  I took a picture of this with the patient's cell phone and showed it to him.  He is going to go to his dermatologist office to see what further management is required.  He denies any pain in the ear or any other fevers or chills.   Current medicines are reviewed at length with the patient today.  The patient concerns regarding his medicines were addressed.  The following changes have been made:  No change  Labs/ tests ordered today include:  No orders of the defined types were placed in this encounter.   Recommend 150 minutes/week of aerobic exercise Low fat, low carb, high fiber diet recommended  Disposition:   FU in 1 year   Signed, Larae Grooms, MD  03/06/2022 10:22 AM    [facility] [place], [place], [place]  [address] Phone: [phone]; Fax: [phone]

## 2022-03-06 ENCOUNTER — Ambulatory Visit (INDEPENDENT_AMBULATORY_CARE_PROVIDER_SITE_OTHER): Payer: Medicare Other | Admitting: Interventional Cardiology

## 2022-03-06 ENCOUNTER — Encounter: Payer: Self-pay | Admitting: Interventional Cardiology

## 2022-03-06 VITALS — BP 140/70 | HR 50 | Ht 68.0 in | Wt 231.0 lb

## 2022-03-06 DIAGNOSIS — I10 Essential (primary) hypertension: Secondary | ICD-10-CM

## 2022-03-06 DIAGNOSIS — E669 Obesity, unspecified: Secondary | ICD-10-CM | POA: Diagnosis not present

## 2022-03-06 DIAGNOSIS — I252 Old myocardial infarction: Secondary | ICD-10-CM | POA: Diagnosis not present

## 2022-03-06 DIAGNOSIS — I77819 Aortic ectasia, unspecified site: Secondary | ICD-10-CM

## 2022-03-06 DIAGNOSIS — E782 Mixed hyperlipidemia: Secondary | ICD-10-CM

## 2022-03-06 DIAGNOSIS — I700 Atherosclerosis of aorta: Secondary | ICD-10-CM | POA: Diagnosis not present

## 2022-03-06 DIAGNOSIS — I2510 Atherosclerotic heart disease of native coronary artery without angina pectoris: Secondary | ICD-10-CM | POA: Diagnosis not present

## 2022-03-06 MED ORDER — AMLODIPINE BESYLATE 10 MG PO TABS
10.0000 mg | ORAL_TABLET | Freq: Every day | ORAL | 3 refills | Status: DC
Start: 2022-03-06 — End: 2023-03-16

## 2022-03-06 NOTE — Patient Instructions (Signed)
Medication Instructions:  Your physician has recommended you make the following change in your medication: Stop metoprolol. Increase Amlodipine to 10 mg by mouth daily  *If you need a refill on your cardiac medications before your next appointment, please call your pharmacy*   Lab Work: none If you have labs (blood work) drawn today and your tests are completely normal, you will receive your results only by: [place] (if you have MyChart) OR A paper copy in the mail If you have any lab test that is abnormal or we need to change your treatment, we will call you to review the results.   Testing/Procedures: Your physician has requested that you have an abdominal aorta duplex. During this test, an ultrasound is used to evaluate the aorta. Allow 30 minutes for this exam. Do not eat after midnight the day before and avoid carbonated beverages To be done in November   Follow-Up: At [facility], you and your health needs are our priority.  As part of our continuing mission to provide you with exceptional heart care, we have created designated Provider Care Teams.  These Care Teams include your primary Cardiologist (physician) and Advanced Practice Providers (APPs -  Physician Assistants and Nurse Practitioners) who all work together to provide you with the care you need, when you need it.  We recommend signing up for the patient portal called "MyChart".  Sign up information is provided on this After Visit Summary.  MyChart is used to connect with patients for Virtual Visits (Telemedicine).  Patients are able to view lab/test results, encounter notes, upcoming appointments, etc.  Non-urgent messages can be sent to your provider as well.   To learn more about what you can do with MyChart, go to [email or web address].    Your next appointment:   12 month(s)  The format for your next appointment:   In Person  Provider:   Larae Grooms, MD     Other Instructions Have area on  ear checked out by skin doctor  Important Information About Sugar

## 2022-03-10 DIAGNOSIS — M47816 Spondylosis without myelopathy or radiculopathy, lumbar region: Secondary | ICD-10-CM | POA: Diagnosis not present

## 2022-04-13 DIAGNOSIS — Z86010 Personal history of colonic polyps: Secondary | ICD-10-CM | POA: Diagnosis not present

## 2022-04-13 DIAGNOSIS — K5730 Diverticulosis of large intestine without perforation or abscess without bleeding: Secondary | ICD-10-CM | POA: Diagnosis not present

## 2022-04-13 DIAGNOSIS — Z09 Encounter for follow-up examination after completed treatment for conditions other than malignant neoplasm: Secondary | ICD-10-CM | POA: Diagnosis not present

## 2022-04-13 DIAGNOSIS — K648 Other hemorrhoids: Secondary | ICD-10-CM | POA: Diagnosis not present

## 2022-04-13 LAB — HM COLONOSCOPY

## 2022-04-15 DIAGNOSIS — M48062 Spinal stenosis, lumbar region with neurogenic claudication: Secondary | ICD-10-CM | POA: Diagnosis not present

## 2022-04-15 DIAGNOSIS — M47816 Spondylosis without myelopathy or radiculopathy, lumbar region: Secondary | ICD-10-CM | POA: Diagnosis not present

## 2022-04-15 DIAGNOSIS — G894 Chronic pain syndrome: Secondary | ICD-10-CM | POA: Diagnosis not present

## 2022-05-08 DIAGNOSIS — L570 Actinic keratosis: Secondary | ICD-10-CM | POA: Diagnosis not present

## 2022-05-08 DIAGNOSIS — L578 Other skin changes due to chronic exposure to nonionizing radiation: Secondary | ICD-10-CM | POA: Diagnosis not present

## 2022-05-25 ENCOUNTER — Ambulatory Visit (INDEPENDENT_AMBULATORY_CARE_PROVIDER_SITE_OTHER): Payer: Medicare Other

## 2022-05-25 ENCOUNTER — Ambulatory Visit (INDEPENDENT_AMBULATORY_CARE_PROVIDER_SITE_OTHER): Payer: Medicare Other | Admitting: Orthopedic Surgery

## 2022-05-25 DIAGNOSIS — I2510 Atherosclerotic heart disease of native coronary artery without angina pectoris: Secondary | ICD-10-CM | POA: Diagnosis not present

## 2022-05-25 DIAGNOSIS — M86661 Other chronic osteomyelitis, right tibia and fibula: Secondary | ICD-10-CM

## 2022-05-26 ENCOUNTER — Encounter: Payer: Self-pay | Admitting: Orthopedic Surgery
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
## 2022-06-15 ENCOUNTER — Other Ambulatory Visit: Payer: Self-pay | Admitting: Interventional Cardiology

## 2022-06-30 DIAGNOSIS — D1801 Hemangioma of skin and subcutaneous tissue: Secondary | ICD-10-CM | POA: Diagnosis not present

## 2022-06-30 DIAGNOSIS — L814 Other melanin hyperpigmentation: Secondary | ICD-10-CM | POA: Diagnosis not present

## 2022-06-30 DIAGNOSIS — R208 Other disturbances of skin sensation: Secondary | ICD-10-CM | POA: Diagnosis not present

## 2022-06-30 DIAGNOSIS — Z08 Encounter for follow-up examination after completed treatment for malignant neoplasm: Secondary | ICD-10-CM | POA: Diagnosis not present

## 2022-06-30 DIAGNOSIS — Z85828 Personal history of other malignant neoplasm of skin: Secondary | ICD-10-CM | POA: Diagnosis not present

## 2022-06-30 DIAGNOSIS — Z789 Other specified health status: Secondary | ICD-10-CM | POA: Diagnosis not present

## 2022-06-30 DIAGNOSIS — L821 Other seborrheic keratosis: Secondary | ICD-10-CM | POA: Diagnosis not present

## 2022-06-30 DIAGNOSIS — L298 Other pruritus: Secondary | ICD-10-CM | POA: Diagnosis not present

## 2022-06-30 DIAGNOSIS — L538 Other specified erythematous conditions: Secondary | ICD-10-CM | POA: Diagnosis not present

## 2022-06-30 DIAGNOSIS — L820 Inflamed seborrheic keratosis: Secondary | ICD-10-CM | POA: Diagnosis not present

## 2022-07-01 DIAGNOSIS — I10 Essential (primary) hypertension: Secondary | ICD-10-CM | POA: Diagnosis not present

## 2022-07-01 DIAGNOSIS — E7800 Pure hypercholesterolemia, unspecified: Secondary | ICD-10-CM | POA: Diagnosis not present

## 2022-07-01 DIAGNOSIS — M48062 Spinal stenosis, lumbar region with neurogenic claudication: Secondary | ICD-10-CM | POA: Diagnosis not present

## 2022-07-01 DIAGNOSIS — Z0000 Encounter for general adult medical examination without abnormal findings: Secondary | ICD-10-CM | POA: Diagnosis not present

## 2022-07-01 DIAGNOSIS — M86669 Other chronic osteomyelitis, unspecified tibia and fibula: Secondary | ICD-10-CM | POA: Diagnosis not present

## 2022-07-01 DIAGNOSIS — I25810 Atherosclerosis of coronary artery bypass graft(s) without angina pectoris: Secondary | ICD-10-CM | POA: Diagnosis not present

## 2022-07-01 DIAGNOSIS — Z1331 Encounter for screening for depression: Secondary | ICD-10-CM | POA: Diagnosis not present

## 2022-07-01 DIAGNOSIS — Z125 Encounter for screening for malignant neoplasm of prostate: Secondary | ICD-10-CM | POA: Diagnosis not present

## 2022-07-18 DIAGNOSIS — Z23 Encounter for immunization: Secondary | ICD-10-CM | POA: Diagnosis not present

## 2022-08-02 ENCOUNTER — Other Ambulatory Visit: Payer: Self-pay | Admitting: Interventional Cardiology

## 2022-08-31 ENCOUNTER — Other Ambulatory Visit (HOSPITAL_COMMUNITY): Payer: Medicare Other

## 2022-09-14 DIAGNOSIS — M47817 Spondylosis without myelopathy or radiculopathy, lumbosacral region: Secondary | ICD-10-CM | POA: Diagnosis not present

## 2022-09-17 ENCOUNTER — Ambulatory Visit (HOSPITAL_COMMUNITY)
Admission: RE | Admit: 2022-09-17 | Discharge: 2022-09-17 | Disposition: A | Discharge status: Home / Self Care | Payer: Medicare Other | Source: Ambulatory Visit | Attending: Interventional Cardiology | Admitting: Interventional Cardiology

## 2022-09-17 DIAGNOSIS — I77819 Aortic ectasia, unspecified site: Secondary | ICD-10-CM | POA: Diagnosis not present

## 2022-09-17 DIAGNOSIS — I700 Atherosclerosis of aorta: Secondary | ICD-10-CM | POA: Diagnosis not present

## 2022-11-03 DIAGNOSIS — M48062 Spinal stenosis, lumbar region with neurogenic claudication: Secondary | ICD-10-CM | POA: Diagnosis not present

## 2022-11-03 DIAGNOSIS — M5137 Other intervertebral disc degeneration, lumbosacral region: Secondary | ICD-10-CM | POA: Diagnosis not present

## 2022-11-03 DIAGNOSIS — M47817 Spondylosis without myelopathy or radiculopathy, lumbosacral region: Secondary | ICD-10-CM | POA: Diagnosis not present

## 2022-11-03 DIAGNOSIS — M5136 Other intervertebral disc degeneration, lumbar region: Secondary | ICD-10-CM | POA: Diagnosis not present

## 2022-11-03 DIAGNOSIS — M47816 Spondylosis without myelopathy or radiculopathy, lumbar region: Secondary | ICD-10-CM | POA: Diagnosis not present

## 2022-11-05 DIAGNOSIS — M48062 Spinal stenosis, lumbar region with neurogenic claudication: Secondary | ICD-10-CM | POA: Diagnosis not present

## 2022-11-05 DIAGNOSIS — G894 Chronic pain syndrome: Secondary | ICD-10-CM | POA: Diagnosis not present

## 2022-11-05 DIAGNOSIS — M47816 Spondylosis without myelopathy or radiculopathy, lumbar region: Secondary | ICD-10-CM | POA: Diagnosis not present

## 2022-11-18 DIAGNOSIS — M5416 Radiculopathy, lumbar region: Secondary | ICD-10-CM | POA: Diagnosis not present

## 2022-11-18 DIAGNOSIS — M48062 Spinal stenosis, lumbar region with neurogenic claudication: Secondary | ICD-10-CM | POA: Diagnosis not present

## 2022-11-18 DIAGNOSIS — M4316 Spondylolisthesis, lumbar region: Secondary | ICD-10-CM | POA: Diagnosis not present

## 2022-11-25 DIAGNOSIS — M48062 Spinal stenosis, lumbar region with neurogenic claudication: Secondary | ICD-10-CM | POA: Diagnosis not present

## 2022-12-10 DIAGNOSIS — G894 Chronic pain syndrome: Secondary | ICD-10-CM | POA: Diagnosis not present

## 2022-12-10 DIAGNOSIS — M48062 Spinal stenosis, lumbar region with neurogenic claudication: Secondary | ICD-10-CM | POA: Diagnosis not present

## 2022-12-10 DIAGNOSIS — M47816 Spondylosis without myelopathy or radiculopathy, lumbar region: Secondary | ICD-10-CM | POA: Diagnosis not present

## 2022-12-23 DIAGNOSIS — M48062 Spinal stenosis, lumbar region with neurogenic claudication: Secondary | ICD-10-CM | POA: Diagnosis not present

## 2022-12-23 DIAGNOSIS — M5416 Radiculopathy, lumbar region: Secondary | ICD-10-CM | POA: Diagnosis not present

## 2022-12-24 ENCOUNTER — Telehealth: Payer: Self-pay | Admitting: Interventional Cardiology

## 2022-12-24 NOTE — Telephone Encounter (Signed)
Patient said that he is dropping off a clearance sheet tomorrow morning for Dr. Irish Lack to sign it and fax it back to spine doctor

## 2022-12-25 ENCOUNTER — Telehealth: Payer: Self-pay | Admitting: *Deleted

## 2022-12-25 ENCOUNTER — Telehealth: Payer: Self-pay

## 2022-12-25 DIAGNOSIS — E7800 Pure hypercholesterolemia, unspecified: Secondary | ICD-10-CM | POA: Diagnosis not present

## 2022-12-25 DIAGNOSIS — I10 Essential (primary) hypertension: Secondary | ICD-10-CM | POA: Diagnosis not present

## 2022-12-25 DIAGNOSIS — M48062 Spinal stenosis, lumbar region with neurogenic claudication: Secondary | ICD-10-CM | POA: Diagnosis not present

## 2022-12-25 LAB — LAB REPORT - SCANNED: EGFR: 77

## 2022-12-25 NOTE — Telephone Encounter (Signed)
Hildreth, MD   Preoperative team, please contact this patient and set up a phone call appointment for further preoperative risk assessment. Please obtain consent and complete medication review. Thank you for your help.   Pending no symptoms of ACS at the time of virtual visit, and per office protocol, he may hold aspirin for 7 days prior to procedure and should resume as soon as hemodynamically stable postoperatively.   Emmaline Life, NP-C  12/25/2022, 3:37 PM [address], [address] Office [phone] Fax [phone]

## 2022-12-25 NOTE — Telephone Encounter (Signed)
   Pre-operative Risk Assessment    Patient Name: Craig Knapp  DOB: 15-Jun-1946 MRN: [id number]      Request for Surgical Clearance    Procedure:   L4-5 LAMINECTOMY, REMOVAL VERTIFLEX SPACER, IN-SITU  ARTHRODESIS  Date of Surgery:  Clearance TBD                                 Surgeon:  DR. MAX COHEN Surgeon's Group or Practice Name:  [facility] Phone number:  [phone] Fax number:  [phone]   Type of Clearance Requested:   - Medical ; ASA x 7 DAYS PRIOR PER CLEARANCE FORM   Type of Anesthesia:  General    Additional requests/questions:    Jiles Prows   12/25/2022, 2:58 PM

## 2022-12-25 NOTE — Telephone Encounter (Signed)
  Patient Consent for Virtual Visit        Craig Knapp has provided verbal consent on 12/25/2022 for a virtual visit (video or telephone).   CONSENT FOR VIRTUAL VISIT FOR:  Craig Knapp  By participating in this virtual visit I agree to the following:  I hereby voluntarily request, consent and authorize [facility] and its employed or contracted physicians, physician assistants, nurse practitioners or other licensed health care professionals (the Practitioner), to provide me with telemedicine health care services (the "Services") as deemed necessary by the treating Practitioner. I acknowledge and consent to receive the Services by the Practitioner via telemedicine. I understand that the telemedicine visit will involve communicating with the Practitioner through live audiovisual communication technology and the disclosure of certain medical information by electronic transmission. I acknowledge that I have been given the opportunity to request an in-person assessment or other available alternative prior to the telemedicine visit and am voluntarily participating in the telemedicine visit.  I understand that I have the right to withhold or withdraw my consent to the use of telemedicine in the course of my care at any time, without affecting my right to future care or treatment, and that the Practitioner or I may terminate the telemedicine visit at any time. I understand that I have the right to inspect all information obtained and/or recorded in the course of the telemedicine visit and may receive copies of available information for a reasonable fee.  I understand that some of the potential risks of receiving the Services via telemedicine include:  Delay or interruption in medical evaluation due to technological equipment failure or disruption; Information transmitted may not be sufficient (e.g. poor resolution of images) to allow for appropriate medical decision making by the Practitioner;  and/or  In rare instances, security protocols could fail, causing a breach of personal health information.  Furthermore, I acknowledge that it is my responsibility to provide information about my medical history, conditions and care that is complete and accurate to the best of my ability. I acknowledge that Practitioner's advice, recommendations, and/or decision may be based on factors not within their control, such as incomplete or inaccurate data provided by me or distortions of diagnostic images or specimens that may result from electronic transmissions. I understand that the practice of medicine is not an exact science and that Practitioner makes no warranties or guarantees regarding treatment outcomes. I acknowledge that a copy of this consent can be made available to me via my patient portal ([facility]), or I can request a printed copy by calling the office of [facility].    I understand that my insurance will be billed for this visit.   I have read or had this consent read to me. I understand the contents of this consent, which adequately explains the benefits and risks of the Services being provided via telemedicine.  I have been provided ample opportunity to ask questions regarding this consent and the Services and have had my questions answered to my satisfaction. I give my informed consent for the services to be provided through the use of telemedicine in my medical care

## 2022-12-25 NOTE — Addendum Note (Signed)
Addended by: Jacinta Shoe on: 12/25/2022 03:50 PM   Modules accepted: Orders

## 2022-12-25 NOTE — Telephone Encounter (Signed)
Spoke with patient who agreeable to do a tele visit on 3/13 at 10 am. Mec rec and consent have been done.

## 2022-12-30 DIAGNOSIS — L814 Other melanin hyperpigmentation: Secondary | ICD-10-CM | POA: Diagnosis not present

## 2022-12-30 DIAGNOSIS — Z85828 Personal history of other malignant neoplasm of skin: Secondary | ICD-10-CM | POA: Diagnosis not present

## 2022-12-30 DIAGNOSIS — D1801 Hemangioma of skin and subcutaneous tissue: Secondary | ICD-10-CM | POA: Diagnosis not present

## 2022-12-30 DIAGNOSIS — Z08 Encounter for follow-up examination after completed treatment for malignant neoplasm: Secondary | ICD-10-CM | POA: Diagnosis not present

## 2022-12-30 DIAGNOSIS — L570 Actinic keratosis: Secondary | ICD-10-CM | POA: Diagnosis not present

## 2022-12-30 DIAGNOSIS — L821 Other seborrheic keratosis: Secondary | ICD-10-CM | POA: Diagnosis not present

## 2022-12-31 ENCOUNTER — Ambulatory Visit: Payer: Medicare Other | Attending: Internal Medicine | Admitting: Nurse Practitioner

## 2022-12-31 DIAGNOSIS — Z01810 Encounter for preprocedural cardiovascular examination: Secondary | ICD-10-CM | POA: Diagnosis not present

## 2022-12-31 NOTE — Progress Notes (Signed)
Virtual Visit via Telephone Note   Because of Craig Knapp's co-morbid illnesses, he is at least at moderate risk for complications without adequate follow up.  This format is felt to be most appropriate for this patient at this time.  The patient did not have access to video technology/had technical difficulties with video requiring transitioning to audio format only (telephone).  All issues noted in this document were discussed and addressed.  No physical exam could be performed with this format.  Please refer to the patient's chart for his consent to telehealth for [facility].  Evaluation Performed:  Preoperative cardiovascular risk assessment _____________   Date:  12/31/2022   Patient ID:  Craig Knapp, DOB Nov 17, 1945, MRN [id number] Patient Location:  Home Provider location:   Office  Primary Care Provider:  Lavone Orn, MD Primary Cardiologist:  Larae Grooms, MD  Chief Complaint / Patient Profile   77 y.o. y/o male with a h/o CAD s/p CABG in 2006, hypertension, hyperlipidemia, aortic atherosclerosis, chronic dyspnea on exertion, DJD, and obesity who is pending L4-5 LAMINECTOMY, REMOVAL VERTIFLEX SPACER, IN-SITU ARTHRODESIS with Dr. Rennis Harding of [facility] and presents today for telephonic preoperative cardiovascular risk assessment.  History of Present Illness    Craig Knapp is a 77 y.o. male who presents via audio/video conferencing for a telehealth visit today.  Pt was last seen in cardiology clinic on 03/06/2022 by Dr. Irish Lack.  At that time Enock Anastasiou was doing well.  The patient is now pending procedure as outlined above. Since his last visit, he has done well from a cardiac standpoint. His activity is somewhat limited by back pain, but otherwise, he reports feeling well.   He denies chest pain, palpitations, dyspnea, pnd, orthopnea, n, v, dizziness, syncope, edema, weight gain, or early satiety. All other systems reviewed and  are otherwise negative except as noted above.   Past Medical History    Past Medical History:  Diagnosis Date   Coronary atherosclerosis of native coronary artery    CABG 2006, EDMUNDS   DJD (degenerative joint disease) `   back and knees   Essential hypertension, benign    History of nuclear stress test    11/08 7 mets, no ischemia, EF 73%   Male hypogonadism    Osteomyelitis ([place])    R tibia 1972, recur 2005   Other and unspecified hyperlipidemia    Past Surgical History:  Procedure Laterality Date   CORONARY ARTERY BYPASS GRAFT  2006    Allergies  No Known Allergies  Home Medications    Prior to Admission medications   Medication Sig Start Date End Date Taking? Authorizing Provider  amLODipine (NORVASC) 10 MG tablet Take 1 tablet (10 mg total) by mouth daily. 03/06/22   Jettie Booze, MD  aspirin 81 MG tablet Take 81 mg by mouth daily.    [provider]  atorvastatin (LIPITOR) 80 MG tablet TAKE ONE TABLET BY MOUTH DAILY 06/16/22   Jettie Booze, MD  fluticasone [facility]) 50 MCG/ACT nasal spray Place into both nostrils as needed for allergies or rhinitis.    [provider]  lisinopril (ZESTRIL) 20 MG tablet TAKE ONE TABLET BY MOUTH TWICE A DAY 08/04/22   Jettie Booze, MD  loratadine (CLARITIN) 10 MG tablet Take 10 mg by mouth daily.    [provider]  naproxen sodium (ANAPROX) 220 MG tablet Take 220 mg by mouth as needed (PAIN).     [provider]  Omega-3  Fatty Acids (FISH OIL) 1000 MG CAPS Take 2 capsules by mouth daily.    [provider]  traMADol Veatrice Bourbon) 50 MG tablet Take by mouth. 12/10/22 02/08/23  [provider]    Physical Exam    Vital Signs:  Craig Knapp does not have vital signs available for review today.   Given telephonic nature of communication, physical exam is limited. AAOx3. NAD. Normal affect.  Speech and respirations are unlabored.  Accessory Clinical Findings     None  Assessment & Plan    1.  Preoperative Cardiovascular Risk Assessment:  According to the Revised Cardiac Risk Index (RCRI), his Perioperative Risk of Major Cardiac Event is (%): 0.9. His Functional Capacity in METs is: 4.73 according to the [facility] Activity Status Index (DASI).Therefore, based on ACC/AHA guidelines, patient would be at acceptable risk for the planned procedure without further cardiovascular testing.   The patient was advised that if he develops new symptoms prior to surgery to contact our office to arrange for a follow-up visit, and he verbalized understanding.   Per office protocol, he may hold Aspirin for 5-7 days prior to procedure. Please resume Aspirin as soon as possible postprocedure, at the discretion of the surgeon.    A copy of this note will be routed to requesting surgeon.  Time:   Today, I have spent 7 minutes with the patient with telehealth technology discussing medical history, symptoms, and management plan.     Lenna Sciara, NP  12/31/2022, 10:09 AM

## 2023-01-08 DIAGNOSIS — M48062 Spinal stenosis, lumbar region with neurogenic claudication: Secondary | ICD-10-CM | POA: Diagnosis not present

## 2023-01-13 DIAGNOSIS — Z01818 Encounter for other preprocedural examination: Secondary | ICD-10-CM | POA: Diagnosis not present

## 2023-01-13 DIAGNOSIS — Z7982 Long term (current) use of aspirin: Secondary | ICD-10-CM | POA: Diagnosis not present

## 2023-01-13 DIAGNOSIS — Z01810 Encounter for preprocedural cardiovascular examination: Secondary | ICD-10-CM | POA: Diagnosis not present

## 2023-01-13 DIAGNOSIS — I452 Bifascicular block: Secondary | ICD-10-CM | POA: Diagnosis not present

## 2023-01-13 DIAGNOSIS — Z79899 Other long term (current) drug therapy: Secondary | ICD-10-CM | POA: Diagnosis not present

## 2023-01-13 DIAGNOSIS — I444 Left anterior fascicular block: Secondary | ICD-10-CM | POA: Diagnosis not present

## 2023-01-18 DIAGNOSIS — I10 Essential (primary) hypertension: Secondary | ICD-10-CM | POA: Diagnosis not present

## 2023-01-18 DIAGNOSIS — M5416 Radiculopathy, lumbar region: Secondary | ICD-10-CM | POA: Diagnosis not present

## 2023-01-18 DIAGNOSIS — I2510 Atherosclerotic heart disease of native coronary artery without angina pectoris: Secondary | ICD-10-CM | POA: Diagnosis not present

## 2023-01-18 DIAGNOSIS — Z951 Presence of aortocoronary bypass graft: Secondary | ICD-10-CM | POA: Diagnosis not present

## 2023-01-18 DIAGNOSIS — M48062 Spinal stenosis, lumbar region with neurogenic claudication: Secondary | ICD-10-CM | POA: Diagnosis not present

## 2023-01-18 DIAGNOSIS — E785 Hyperlipidemia, unspecified: Secondary | ICD-10-CM | POA: Diagnosis not present

## 2023-01-18 DIAGNOSIS — Z981 Arthrodesis status: Secondary | ICD-10-CM | POA: Diagnosis not present

## 2023-01-18 DIAGNOSIS — M4316 Spondylolisthesis, lumbar region: Secondary | ICD-10-CM | POA: Diagnosis not present

## 2023-01-18 HISTORY — PX: SPINE SURGERY: SHX786

## 2023-02-18 DIAGNOSIS — M4316 Spondylolisthesis, lumbar region: Secondary | ICD-10-CM | POA: Diagnosis not present

## 2023-03-03 ENCOUNTER — Ambulatory Visit (INDEPENDENT_AMBULATORY_CARE_PROVIDER_SITE_OTHER): Payer: Medicare Other | Admitting: Emergency Medicine

## 2023-03-03 ENCOUNTER — Encounter: Payer: Self-pay | Admitting: Emergency Medicine

## 2023-03-03 VITALS — BP 130/68 | HR 65 | Temp 98.1°F | Ht 68.0 in | Wt 217.1 lb

## 2023-03-03 DIAGNOSIS — M86661 Other chronic osteomyelitis, right tibia and fibula: Secondary | ICD-10-CM

## 2023-03-03 DIAGNOSIS — E785 Hyperlipidemia, unspecified: Secondary | ICD-10-CM

## 2023-03-03 DIAGNOSIS — I2510 Atherosclerotic heart disease of native coronary artery without angina pectoris: Secondary | ICD-10-CM | POA: Diagnosis not present

## 2023-03-03 DIAGNOSIS — I10 Essential (primary) hypertension: Secondary | ICD-10-CM

## 2023-03-03 DIAGNOSIS — Z7689 Persons encountering health services in other specified circumstances: Secondary | ICD-10-CM

## 2023-03-03 NOTE — Progress Notes (Signed)
Craig Knapp 77 y.o.   Chief Complaint  Patient presents with   New Patient (Initial Visit)    No concerns, just wants to establish care     HISTORY OF PRESENT ILLNESS: This is a 77 y.o. male first visit to this office, here to establish care with me History of hypertension, CAD, and dyslipidemia Has no complaints or any other medical concerns today.  HPI   Prior to Admission medications   Medication Sig Start Date End Date Taking? Authorizing Provider  amLODipine (NORVASC) 10 MG tablet Take 1 tablet (10 mg total) by mouth daily. 03/06/22  Yes Corky Crafts, MD  aspirin 81 MG tablet Take 81 mg by mouth daily.   Yes [provider]  atorvastatin (LIPITOR) 80 MG tablet TAKE ONE TABLET BY MOUTH DAILY 06/16/22  Yes Corky Crafts, MD  fluticasone [facility]) 50 MCG/ACT nasal spray Place into both nostrils as needed for allergies or rhinitis.   Yes [provider]  lisinopril (ZESTRIL) 20 MG tablet TAKE ONE TABLET BY MOUTH TWICE A DAY 08/04/22  Yes Corky Crafts, MD  loratadine (CLARITIN) 10 MG tablet Take 10 mg by mouth daily.   Yes [provider]  naproxen sodium (ANAPROX) 220 MG tablet Take 220 mg by mouth as needed (PAIN).    Yes [provider]  Omega-3 Fatty Acids (FISH OIL) 1000 MG CAPS Take 2 capsules by mouth daily.   Yes [provider]    Allergies  Allergen Reactions   Codeine Nausea Only    "Crazy feeling in my head"    Patient Active Problem List   Diagnosis Date Noted   Obesity, unspecified 07/03/2014   Hypogonadism male 03/27/2011   Dyslipidemia 08/06/2008   Essential hypertension 08/06/2008   Coronary atherosclerosis 08/06/2008   Chronic osteomyelitis of lower leg (HCC) 08/06/2008   CORONARY ARTERY BYPASS GRAFT, HX OF 08/06/2008    Past Medical History:  Diagnosis Date   Coronary atherosclerosis of native coronary artery    CABG 2006, EDMUNDS   DJD (degenerative joint disease) `   back and  knees   Essential hypertension, benign    History of nuclear stress test    11/08 7 mets, no ischemia, EF 73%   Male hypogonadism    Osteomyelitis (HCC)    R tibia 1972, recur 2005   Other and unspecified hyperlipidemia     Past Surgical History:  Procedure Laterality Date   CORONARY ARTERY BYPASS GRAFT  2006    Social History   Socioeconomic History   Marital status: Divorced    Spouse name: Not on file   Number of children: Not on file   Years of education: Not on file   Highest education level: Not on file  Occupational History   Not on file  Tobacco Use   Smoking status: Never   Smokeless tobacco: Current    Types: Snuff  Vaping Use   Vaping Use: Never used  Substance and Sexual Activity   Alcohol use: Yes    Alcohol/week: 7.0 standard drinks of alcohol    Types: 7 Standard drinks or equivalent per week    Comment: daily in afternoons   Drug use: No   Sexual activity: Not on file  Other Topics Concern   Not on file  Social History Narrative   Not on file   Social Determinants of Health   Financial Resource Strain: Not on file  Food Insecurity: Not on file  Transportation Needs: Not on file  Physical Activity: Not on file  Stress: Not on file  Social Connections: Not on file  Intimate Partner Violence: Not on file    Family History  Problem Relation Age of Onset   Heart disease Mother        CABG   Lung cancer Mother    Alcoholism Father    Heart attack Neg Hx      Review of Systems  Constitutional: Negative.  Negative for chills and fever.  HENT: Negative.  Negative for congestion and sore throat.   Respiratory: Negative.  Negative for cough and shortness of breath.   Cardiovascular: Negative.  Negative for chest pain and palpitations.  Gastrointestinal:  Negative for abdominal pain, diarrhea, nausea and vomiting.  Genitourinary: Negative.  Negative for dysuria and hematuria.  Skin: Negative.  Negative for rash.  Neurological: Negative.   Negative for dizziness and headaches.  All other systems reviewed and are negative.   Vitals:   03/03/23 1043  BP: 130/68  Pulse: 65  Temp: 98.1 F (36.7 C)  SpO2: 98%    Physical Exam Vitals reviewed.  Constitutional:      Appearance: Normal appearance.  HENT:     Head: Normocephalic.  Eyes:     Extraocular Movements: Extraocular movements intact.  Cardiovascular:     Rate and Rhythm: Normal rate and regular rhythm.     Pulses: Normal pulses.     Heart sounds: Normal heart sounds.  Pulmonary:     Effort: Pulmonary effort is normal.     Breath sounds: Normal breath sounds.  Musculoskeletal:     Cervical back: No tenderness.  Lymphadenopathy:     Cervical: No cervical adenopathy.  Skin:    General: Skin is warm and dry.     Comments: Chronic wound to right lower leg.  No infection  Neurological:     General: No focal deficit present.     Mental Status: He is alert and oriented to person, place, and time.  Psychiatric:        Mood and Affect: Mood normal.        Behavior: Behavior normal.      ASSESSMENT & PLAN: A total of 48 minutes was spent with the patient and counseling/coordination of care regarding preparing for this visit, review of available medical records, establishing care with me, review of multiple chronic medical conditions under management, review of all medications, education on nutrition, review of health maintenance items, review of most recent blood work results, prognosis, documentation and need for follow-up.  Problem List Items Addressed This Visit       Cardiovascular and Mediastinum   Essential hypertension - Primary    BP Readings from Last 3 Encounters:  03/03/23 130/68  03/06/22 140/70  03/21/21 (!) 148/60  Well-controlled hypertension Continue amlodipine 10 mg and lisinopril 20 mg daily       Coronary atherosclerosis    Stable and well-controlled No recent anginal episodes History of CABG in 2006 Continues daily baby  aspirin        Musculoskeletal and Integument   Chronic osteomyelitis of lower leg (HCC)    Stable and well-controlled.        Other   Dyslipidemia    Stable chronic condition Continue atorvastatin 80 mg daily.      Other Visit Diagnoses     Encounter to establish care          Patient Instructions  Health Maintenance After Age 37 After age 20, you are at a higher  risk for certain long-term diseases and infections as well as injuries from falls. Falls are a major cause of broken bones and head injuries in people who are older than age 17. Getting regular preventive care can help to keep you healthy and well. Preventive care includes getting regular testing and making lifestyle changes as recommended by your health care provider. Talk with your health care provider about: Which screenings and tests you should have. A screening is a test that checks for a disease when you have no symptoms. A diet and exercise plan that is right for you. What should I know about screenings and tests to prevent falls? Screening and testing are the best ways to find a health problem early. Early diagnosis and treatment give you the best chance of managing medical conditions that are common after age 15. Certain conditions and lifestyle choices may make you more likely to have a fall. Your health care provider may recommend: Regular vision checks. Poor vision and conditions such as cataracts can make you more likely to have a fall. If you wear glasses, make sure to get your prescription updated if your vision changes. Medicine review. Work with your health care provider to regularly review all of the medicines you are taking, including over-the-counter medicines. Ask your health care provider about any side effects that may make you more likely to have a fall. Tell your health care provider if any medicines that you take make you feel dizzy or sleepy. Strength and balance checks. Your health care provider  may recommend certain tests to check your strength and balance while standing, walking, or changing positions. Foot health exam. Foot pain and numbness, as well as not wearing proper footwear, can make you more likely to have a fall. Screenings, including: Osteoporosis screening. Osteoporosis is a condition that causes the bones to get weaker and break more easily. Blood pressure screening. Blood pressure changes and medicines to control blood pressure can make you feel dizzy. Depression screening. You may be more likely to have a fall if you have a fear of falling, feel depressed, or feel unable to do activities that you used to do. Alcohol use screening. Using too much alcohol can affect your balance and may make you more likely to have a fall. Follow these instructions at home: Lifestyle Do not drink alcohol if: Your health care provider tells you not to drink. If you drink alcohol: Limit how much you have to: 0-1 drink a day for women. 0-2 drinks a day for men. Know how much alcohol is in your drink. In the [place] one drink equals one 12 oz bottle of beer (355 mL), one 5 oz glass of wine (148 mL), or one 1 oz glass of hard liquor (44 mL). Do not use any products that contain nicotine or tobacco. These products include cigarettes, chewing tobacco, and vaping devices, such as e-cigarettes. If you need help quitting, ask your health care provider. Activity  Follow a regular exercise program to stay fit. This will help you maintain your balance. Ask your health care provider what types of exercise are appropriate for you. If you need a cane or walker, use it as recommended by your health care provider. Wear supportive shoes that have nonskid soles. Safety  Remove any tripping hazards, such as rugs, cords, and clutter. Install safety equipment such as grab bars in bathrooms and safety rails on stairs. Keep rooms and walkways well-lit. General instructions Talk with your health care  provider about your risks  for falling. Tell your health care provider if: You fall. Be sure to tell your health care provider about all falls, even ones that seem minor. You feel dizzy, tiredness (fatigue), or off-balance. Take over-the-counter and prescription medicines only as told by your health care provider. These include supplements. Eat a healthy diet and maintain a healthy weight. A healthy diet includes low-fat dairy products, low-fat (lean) meats, and fiber from whole grains, beans, and lots of fruits and vegetables. Stay current with your vaccines. Schedule regular health, dental, and eye exams. Summary Having a healthy lifestyle and getting preventive care can help to protect your health and wellness after age 69. Screening and testing are the best way to find a health problem early and help you avoid having a fall. Early diagnosis and treatment give you the best chance for managing medical conditions that are more common for people who are older than age 68. Falls are a major cause of broken bones and head injuries in people who are older than age 6. Take precautions to prevent a fall at home. Work with your health care provider to learn what changes you can make to improve your health and wellness and to prevent falls. This information is not intended to replace advice given to you by your health care provider. Make sure you discuss any questions you have with your health care provider. Document Revised: 02/24/2021 Document Reviewed: 02/24/2021 Elsevier Patient Education  2023 Elsevier Inc.     Edwina Barth, MD [facility]

## 2023-03-03 NOTE — Assessment & Plan Note (Signed)
Stable and well controlled. 

## 2023-03-03 NOTE — Assessment & Plan Note (Signed)
Stable chronic condition Continue atorvastatin 80 mg daily.

## 2023-03-03 NOTE — Assessment & Plan Note (Signed)
Stable and well-controlled No recent anginal episodes History of CABG in 2006 Continues daily baby aspirin

## 2023-03-03 NOTE — Patient Instructions (Signed)
Health Maintenance After Age 76 After age 76, you are at a higher risk for certain long-term diseases and infections as well as injuries from falls. Falls are a major cause of broken bones and head injuries in people who are older than age 76. Getting regular preventive care can help to keep you healthy and well. Preventive care includes getting regular testing and making lifestyle changes as recommended by your health care provider. Talk with your health care provider about: Which screenings and tests you should have. A screening is a test that checks for a disease when you have no symptoms. A diet and exercise plan that is right for you. What should I know about screenings and tests to prevent falls? Screening and testing are the best ways to find a health problem early. Early diagnosis and treatment give you the best chance of managing medical conditions that are common after age 76. Certain conditions and lifestyle choices may make you more likely to have a fall. Your health care provider may recommend: Regular vision checks. Poor vision and conditions such as cataracts can make you more likely to have a fall. If you wear glasses, make sure to get your prescription updated if your vision changes. Medicine review. Work with your health care provider to regularly review all of the medicines you are taking, including over-the-counter medicines. Ask your health care provider about any side effects that may make you more likely to have a fall. Tell your health care provider if any medicines that you take make you feel dizzy or sleepy. Strength and balance checks. Your health care provider may recommend certain tests to check your strength and balance while standing, walking, or changing positions. Foot health exam. Foot pain and numbness, as well as not wearing proper footwear, can make you more likely to have a fall. Screenings, including: Osteoporosis screening. Osteoporosis is a condition that causes  the bones to get weaker and break more easily. Blood pressure screening. Blood pressure changes and medicines to control blood pressure can make you feel dizzy. Depression screening. You may be more likely to have a fall if you have a fear of falling, feel depressed, or feel unable to do activities that you used to do. Alcohol use screening. Using too much alcohol can affect your balance and may make you more likely to have a fall. Follow these instructions at home: Lifestyle Do not drink alcohol if: Your health care provider tells you not to drink. If you drink alcohol: Limit how much you have to: 0-1 drink a day for women. 0-2 drinks a day for men. Know how much alcohol is in your drink. In the [place] one drink equals one 12 oz bottle of beer (355 mL), one 5 oz glass of wine (148 mL), or one 1 oz glass of hard liquor (44 mL). Do not use any products that contain nicotine or tobacco. These products include cigarettes, chewing tobacco, and vaping devices, such as e-cigarettes. If you need help quitting, ask your health care provider. Activity  Follow a regular exercise program to stay fit. This will help you maintain your balance. Ask your health care provider what types of exercise are appropriate for you. If you need a cane or walker, use it as recommended by your health care provider. Wear supportive shoes that have nonskid soles. Safety  Remove any tripping hazards, such as rugs, cords, and clutter. Install safety equipment such as grab bars in bathrooms and safety rails on stairs. Keep rooms and walkways   well-lit. General instructions Talk with your health care provider about your risks for falling. Tell your health care provider if: You fall. Be sure to tell your health care provider about all falls, even ones that seem minor. You feel dizzy, tiredness (fatigue), or off-balance. Take over-the-counter and prescription medicines only as told by your health care provider. These include  supplements. Eat a healthy diet and maintain a healthy weight. A healthy diet includes low-fat dairy products, low-fat (lean) meats, and fiber from whole grains, beans, and lots of fruits and vegetables. Stay current with your vaccines. Schedule regular health, dental, and eye exams. Summary Having a healthy lifestyle and getting preventive care can help to protect your health and wellness after age 76. Screening and testing are the best way to find a health problem early and help you avoid having a fall. Early diagnosis and treatment give you the best chance for managing medical conditions that are more common for people who are older than age 76. Falls are a major cause of broken bones and head injuries in people who are older than age 76. Take precautions to prevent a fall at home. Work with your health care provider to learn what changes you can make to improve your health and wellness and to prevent falls. This information is not intended to replace advice given to you by your health care provider. Make sure you discuss any questions you have with your health care provider. Document Revised: 02/24/2021 Document Reviewed: 02/24/2021 Elsevier Patient Education  2023 Elsevier Inc.  

## 2023-03-03 NOTE — Assessment & Plan Note (Signed)
BP Readings from Last 3 Encounters:  03/03/23 130/68  03/06/22 140/70  03/21/21 (!) 148/60  Well-controlled hypertension Continue amlodipine 10 mg and lisinopril 20 mg daily

## 2023-03-14 ENCOUNTER — Other Ambulatory Visit: Payer: Self-pay | Admitting: Interventional Cardiology

## 2023-03-16 ENCOUNTER — Other Ambulatory Visit: Payer: Self-pay | Admitting: *Deleted

## 2023-03-16 ENCOUNTER — Other Ambulatory Visit: Payer: Self-pay

## 2023-03-16 MED ORDER — AMLODIPINE BESYLATE 10 MG PO TABS: 10.0000 mg | ORAL_TABLET | Freq: Every day | ORAL | 0 refills | Status: DC

## 2023-03-16 MED ORDER — ATORVASTATIN CALCIUM 80 MG PO TABS: 80.0000 mg | ORAL_TABLET | Freq: Every day | ORAL | 3 refills | Status: DC

## 2023-03-16 NOTE — Telephone Encounter (Signed)
Pt's medication was sent to pt's pharmacy as requested. Confirmation received.  °

## 2023-03-30 ENCOUNTER — Other Ambulatory Visit: Payer: Self-pay | Admitting: Interventional Cardiology

## 2023-04-05 NOTE — Progress Notes (Unsigned)
Cardiology Office Note    Date:  04/06/2023   ID:  Craig Knapp, DOB Jun 28, 1946, MRN [id number]  PCP:  Georgina Quint, MD  Cardiologist:  Lance Muss, MD  Electrophysiologist:  None   Chief Complaint: f/u CAD  History of Present Illness:   Craig Knapp is a 77 y.o. male with history of CAD s/p CABG in 2006, hypertension, hyperlipidemia, aortic atherosclerosis, chronic dyspnea on exertion, RBBB+LAFB, DJD, former tobacco abuse, infrarenal aortic ectasia, obesity here for annual follow-up.  He is followed by Dr. Eldridge Dace for the above. Last nuc 2020 showed EF 61%, "small defect of moderate severity present in the mid inferior and apical inferior location. This is likely due to diaphragmatic attenuation. I cannot exclude a prior non-transmural inferior wall infarction . His LV function is normal and the inferior wall contracts normally. This is a low risk study." AAA duplex 08/2022 showed stable results, "no evidence of an abdominal aortic aneurysm was visualized. The largest aortic measurement is 2.6 cm. The infrarenal distal abdominal aorta is ectatic, measuring 2.6 cm and is consistent with MRI results on 08/28/2021." Metoprolol was stopped due to bradycardia at last OV in 02/2022. 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  He is seen for follow-up today feeling well without recent anginal complaints. No CP, SOB, palpitations, edema, or syncope. Tolerating all meds well. We discussed labs from 01/2023. He notes that low sodium is what previously prompted hydrochlorothiazide to be discontinued.  Labwork independently reviewed: 01/2023 K 4.6, Cr 0.83, NA 132, Hgb 11.8, plt 241 12/2022 LFTs ok 06/2022 LDL 34 by scanned labs  Past History   Past Medical History:  Diagnosis Date   Coronary atherosclerosis of native coronary artery    CABG 2006, EDMUNDS   DJD (degenerative joint disease) `   back and knees   Essential hypertension, benign    History of nuclear stress  test    11/08 7 mets, no ischemia, EF 73%   Male hypogonadism    Osteomyelitis (HCC)    R tibia 1972, recur 2005   Other and unspecified hyperlipidemia     Past Surgical History:  Procedure Laterality Date   CORONARY ARTERY BYPASS GRAFT  2006    Current Medications: Current Meds  Medication Sig   amLODipine (NORVASC) 10 MG tablet Take 1 tablet (10 mg total) by mouth daily.   aspirin 81 MG tablet Take 81 mg by mouth daily.   atorvastatin (LIPITOR) 80 MG tablet Take 1 tablet (80 mg total) by mouth daily.   fluticasone (FLONASE) 50 MCG/ACT nasal spray Place into both nostrils as needed for allergies or rhinitis.   lisinopril (ZESTRIL) 20 MG tablet TAKE ONE TABLET BY MOUTH TWICE A DAY   loratadine (CLARITIN) 10 MG tablet Take 10 mg by mouth daily.   naproxen sodium (ANAPROX) 220 MG tablet Take 220 mg by mouth as needed (PAIN).    Omega-3 Fatty Acids (FISH OIL) 1000 MG CAPS Take 2 capsules by mouth daily.      Allergies:   Codeine   Social History   Socioeconomic History   Marital status: Divorced    Spouse name: Not on file   Number of children: Not on file   Years of education: Not on file   Highest education level: Not on file  Occupational History   Not on file  Tobacco Use   Smoking status: Never   Smokeless tobacco: Current    Types: Snuff  Vaping Use   Vaping Use: Never used  Substance and Sexual Activity   Alcohol use: Yes    Alcohol/week: 7.0 standard drinks of alcohol    Types: 7 Standard drinks or equivalent per week    Comment: daily in afternoons   Drug use: No   Sexual activity: Not on file  Other Topics Concern   Not on file  Social History Narrative   Not on file   Social Determinants of Health   Financial Resource Strain: Not on file  Food Insecurity: Not on file  Transportation Needs: Not on file  Physical Activity: Not on file  Stress: Not on file  Social Connections: Not on file     Family History:  The patient's family history  includes Alcoholism in his father; Heart disease in his mother; Lung cancer in his mother. There is no history of Heart attack.  ROS:   Please see the history of present illness.  All other systems are reviewed and otherwise negative.    EKG(s)/Additional Testing   EKG:  EKG is ordered today, personally reviewed, demonstrating SB 54bpm RBBB LAFB nonspecific TW changes possible prior inferior infarct without acute change from prior  CV Studies: Cardiac studies reviewed are outlined and summarized above. Otherwise please see EMR for full report.  Recent Labs: No results found for requested labs within last 365 days.  Recent Lipid Panel    Component Value Date/Time   CHOL 141 09/11/2019 0759   TRIG 87 09/11/2019 0759   HDL 84 09/11/2019 0759   CHOLHDL 1.7 09/11/2019 0759   CHOLHDL 1.4 06/24/2016 0738   VLDL 19 06/24/2016 0738   LDLCALC 41 09/11/2019 0759    PHYSICAL EXAM:    VS:  BP 126/62   Pulse (!) 54   Ht 5\' 8"  (1.727 m)   Wt 221 lb 3.2 oz (100.3 kg)   SpO2 97%   BMI 33.63 kg/m   BMI: Body mass index is 33.63 kg/m.  GEN: Well nourished, well developed male in no acute distress HEENT: normocephalic, atraumatic Neck: no JVD, carotid bruits, or masses Cardiac: RRR; no murmurs, rubs, or gallops, no edema  Respiratory:  clear to auscultation bilaterally, normal work of breathing GI: soft, nontender, nondistended, + BS MS: no deformity or atrophy Skin: warm and dry, no rash Neuro:  Alert and Oriented x 3, Strength and sensation are intact, follows commands Psych: euthymic mood, full affect  Wt Readings from Last 3 Encounters:  04/06/23 221 lb 3.2 oz (100.3 kg)  03/03/23 217 lb 2 oz (98.5 kg)  03/06/22 231 lb (104.8 kg)     ASSESSMENT & PLAN:   1. CAD s/p CABG, Hyperlipidemia - doing well without recent angina. Remains abstinent of tobacco. Continue current regimen. Denies needing refills today. He did have mild decline in Hgb by last labs. Will f/u CBC today.  Denies bleeding. No longer on BB due to baseline bradycardia.  2. Infrarenal aortic ectasia - per duplex note 08/2022, Dr. Eldridge Dace did not feel any further follow-up was needed. No AAA seen at that time.  3. Essential HTN with recent hyponatremia, hyperkalemia - BP controlled. Continue present regimen but recheck BMET to help guide whether lisinopril would need to be adjusted for hyperkalemia. If hyponatremia is still present, recommend f/u PCP for further eval as he is not on any offending medicines to cause this.  4. RBBB+LAFB - discussed finding with patient. Per EKG review,  also seen back for several years (I.e. 2022, 2020). Has not had any bradycardia since stopping metoprolol but will notify  for any abnormal VS readings.    Disposition: F/u with Dr. Eldridge Dace in 1 year.   Medication Adjustments/Labs and Tests Ordered: Current medicines are reviewed at length with the patient today.  Concerns regarding medicines are outlined above. Medication changes, Labs and Tests ordered today are summarized above and listed in the Patient Instructions accessible in Encounters.   Signed, Laurann Montana, PA-C  04/06/2023 4:00 PM    [facility] Phone: [phone]; Fax: [phone]

## 2023-04-06 ENCOUNTER — Ambulatory Visit: Payer: Medicare Other | Attending: Physician Assistant | Admitting: Physician Assistant

## 2023-04-06 ENCOUNTER — Encounter: Payer: Self-pay | Admitting: Physician Assistant

## 2023-04-06 VITALS — BP 126/62 | HR 54 | Ht 68.0 in | Wt 221.2 lb

## 2023-04-06 DIAGNOSIS — E875 Hyperkalemia: Secondary | ICD-10-CM | POA: Insufficient documentation

## 2023-04-06 DIAGNOSIS — E785 Hyperlipidemia, unspecified: Secondary | ICD-10-CM | POA: Diagnosis not present

## 2023-04-06 DIAGNOSIS — E871 Hypo-osmolality and hyponatremia: Secondary | ICD-10-CM | POA: Insufficient documentation

## 2023-04-06 DIAGNOSIS — I2510 Atherosclerotic heart disease of native coronary artery without angina pectoris: Secondary | ICD-10-CM | POA: Insufficient documentation

## 2023-04-06 DIAGNOSIS — I452 Bifascicular block: Secondary | ICD-10-CM | POA: Insufficient documentation

## 2023-04-06 DIAGNOSIS — I77819 Aortic ectasia, unspecified site: Secondary | ICD-10-CM | POA: Diagnosis not present

## 2023-04-06 DIAGNOSIS — I10 Essential (primary) hypertension: Secondary | ICD-10-CM | POA: Diagnosis not present

## 2023-04-06 NOTE — Patient Instructions (Signed)
Medication Instructions:  Your physician recommends that you continue on your current medications as directed. Please refer to the Current Medication list given to you today.  *If you need a refill on your cardiac medications before your next appointment, please call your pharmacy*   Lab Work: BMET, CBC If you have labs (blood work) drawn today and your tests are completely normal, you will receive your results only by: MyChart Message (if you have MyChart) OR A paper copy in the mail If you have any lab test that is abnormal or we need to change your treatment, we will call you to review the results.   Follow-Up: At [facility], you and your health needs are our priority.  As part of our continuing mission to provide you with exceptional heart care, we have created designated Provider Care Teams.  These Care Teams include your primary Cardiologist (physician) and Advanced Practice Providers (APPs -  Physician Assistants and Nurse Practitioners) who all work together to provide you with the care you need, when you need it.   Your next appointment:   1 year(s)  Provider:   Lance Muss, MD

## 2023-04-07 LAB — BASIC METABOLIC PANEL
BUN/Creatinine Ratio: 13 (ref 10–24)
BUN: 14 mg/dL (ref 8–27)
CO2: 22 mmol/L (ref 20–29)
Calcium: 9.5 mg/dL (ref 8.6–10.2)
Chloride: 99 mmol/L (ref 96–106)
Creatinine, Ser: 1.04 mg/dL (ref 0.76–1.27)
Glucose: 92 mg/dL (ref 70–99)
Potassium: 4.7 mmol/L (ref 3.5–5.2)
Sodium: 134 mmol/L (ref 134–144)
eGFR: 75 mL/min/{1.73_m2} (ref >59)

## 2023-04-07 LAB — CBC
Hematocrit: 44 % (ref 37.5–51.0)
Hemoglobin: 15.1 g/dL (ref 13.0–17.7)
MCH: 32.4 pg (ref 26.6–33.0)
MCHC: 34.3 g/dL (ref 31.5–35.7)
MCV: 94 fL (ref 79–97)
Platelets: 253 10*3/uL (ref 150–450)
RBC: 4.66 x10E6/uL (ref 4.14–5.80)
RDW: 12.1 % (ref 11.6–15.4)
WBC: 7.4 10*3/uL (ref 3.4–10.8)

## 2023-04-21 DIAGNOSIS — M4316 Spondylolisthesis, lumbar region: Secondary | ICD-10-CM | POA: Diagnosis not present

## 2023-04-21 DIAGNOSIS — Z6832 Body mass index (BMI) 32.0-32.9, adult: Secondary | ICD-10-CM | POA: Diagnosis not present

## 2023-04-29 ENCOUNTER — Other Ambulatory Visit: Payer: Self-pay | Admitting: Interventional Cardiology

## 2023-05-06 DIAGNOSIS — C4442 Squamous cell carcinoma of skin of scalp and neck: Secondary | ICD-10-CM | POA: Diagnosis not present

## 2023-05-06 DIAGNOSIS — L570 Actinic keratosis: Secondary | ICD-10-CM | POA: Diagnosis not present

## 2023-05-06 DIAGNOSIS — D485 Neoplasm of uncertain behavior of skin: Secondary | ICD-10-CM | POA: Diagnosis not present

## 2023-05-24 DIAGNOSIS — L814 Other melanin hyperpigmentation: Secondary | ICD-10-CM | POA: Diagnosis not present

## 2023-05-24 DIAGNOSIS — D044 Carcinoma in situ of skin of scalp and neck: Secondary | ICD-10-CM | POA: Diagnosis not present

## 2023-05-24 DIAGNOSIS — Z08 Encounter for follow-up examination after completed treatment for malignant neoplasm: Secondary | ICD-10-CM | POA: Diagnosis not present

## 2023-05-24 DIAGNOSIS — Z85828 Personal history of other malignant neoplasm of skin: Secondary | ICD-10-CM | POA: Diagnosis not present

## 2023-05-24 DIAGNOSIS — D225 Melanocytic nevi of trunk: Secondary | ICD-10-CM | POA: Diagnosis not present

## 2023-05-24 DIAGNOSIS — L821 Other seborrheic keratosis: Secondary | ICD-10-CM | POA: Diagnosis not present

## 2023-05-24 DIAGNOSIS — L570 Actinic keratosis: Secondary | ICD-10-CM | POA: Diagnosis not present

## 2023-07-02 ENCOUNTER — Ambulatory Visit (INDEPENDENT_AMBULATORY_CARE_PROVIDER_SITE_OTHER): Payer: Medicare Other

## 2023-07-02 VITALS — Ht 68.0 in | Wt 221.0 lb

## 2023-07-02 DIAGNOSIS — Z0000 Encounter for general adult medical examination without abnormal findings: Secondary | ICD-10-CM

## 2023-07-02 NOTE — Progress Notes (Signed)
Subjective:   Craig Knapp is a 77 y.o. male who presents for Medicare Annual/Subsequent preventive examination.  Visit Complete: Virtual  I connected with  Craig Knapp on 07/02/23 by a audio enabled telemedicine application and verified that I am speaking with the correct person using two identifiers.  Patient Location: Home  Provider Location: Office/Clinic  I discussed the limitations of evaluation and management by telemedicine. The patient expressed understanding and agreed to proceed.  Patient Medicare AWV questionnaire was completed by the patient on 07/01/23; I have confirmed that all information answered by patient is correct and no changes since this date.  Vital Signs: Because this visit was a virtual/telehealth visit, some criteria may be missing or patient reported. Any vitals not documented were not able to be obtained and vitals that have been documented are patient reported.    Cardiac Risk Factors include: advanced age (>41men, >21 women);hypertension;dyslipidemia;male gender     Objective:    Today's Vitals   07/02/23 0902  Weight: 221 lb (100.2 kg)  Height: 5\' 8"  (1.727 m)   Body mass index is 33.6 kg/m.     07/02/2023    9:10 AM  Advanced Directives  Does Patient Have a Medical Advance Directive? Yes  Type of Estate agent of [place];Living will  Copy of Healthcare Power of Attorney in Chart? No - copy requested    Current Medications (verified) Outpatient Encounter Medications as of 07/02/2023  Medication Sig   amLODipine (NORVASC) 10 MG tablet Take 1 tablet (10 mg total) by mouth daily.   aspirin 81 MG tablet Take 81 mg by mouth daily.   atorvastatin (LIPITOR) 80 MG tablet Take 1 tablet (80 mg total) by mouth daily.   fluticasone (FLONASE) 50 MCG/ACT nasal spray Place into both nostrils as needed for allergies or rhinitis.   lisinopril (ZESTRIL) 20 MG tablet TAKE 1 TABLET BY MOUTH TWICE A DAY   naproxen sodium (ANAPROX)  220 MG tablet Take 220 mg by mouth as needed (PAIN).    Omega-3 Fatty Acids (FISH OIL) 1000 MG CAPS Take 2 capsules by mouth daily.   loratadine (CLARITIN) 10 MG tablet Take 10 mg by mouth daily.   No facility-administered encounter medications on file as of 07/02/2023.    Allergies (verified) Codeine   History: Past Medical History:  Diagnosis Date   Coronary atherosclerosis of native coronary artery    CABG 2006, EDMUNDS   DJD (degenerative joint disease) `   back and knees   Essential hypertension, benign    History of nuclear stress test    11/08 7 mets, no ischemia, EF 73%   Male hypogonadism    Osteomyelitis (HCC)    R tibia 1972, recur 2005   Other and unspecified hyperlipidemia    Past Surgical History:  Procedure Laterality Date   CORONARY ARTERY BYPASS GRAFT  2006   SPINE SURGERY  01/18/2023   (laminectomy surgery) L3,4,5   Family History  Problem Relation Age of Onset   Heart disease Mother        CABG   Lung cancer Mother    Alcoholism Father    Heart attack Neg Hx    Social History   Socioeconomic History   Marital status: Divorced    Spouse name: Not on file   Number of children: 3   Years of education: Not on file   Highest education level: Not on file  Occupational History   Occupation: Retired  Tobacco Use   Smoking status: Never  Smokeless tobacco: Current    Types: Snuff  Vaping Use   Vaping status: Never Used  Substance and Sexual Activity   Alcohol use: Yes    Alcohol/week: 7.0 standard drinks of alcohol    Types: 7 Standard drinks or equivalent per week    Comment: daily in afternoons   Drug use: No   Sexual activity: Not Currently  Other Topics Concern   Not on file  Social History Narrative   Does not live alone, has a partner   Social Determinants of Health   Financial Resource Strain: Medium Risk (07/01/2023)   Overall Financial Resource Strain (CARDIA)    Difficulty of Paying Living Expenses: Somewhat hard  Food  Insecurity: No Food Insecurity (07/01/2023)   Hunger Vital Sign    Worried About Running Out of Food in the Last Year: Never true    Ran Out of Food in the Last Year: Never true  Transportation Needs: No Transportation Needs (07/01/2023)   PRAPARE - Administrator, Civil Service (Medical): No    Lack of Transportation (Non-Medical): No  Physical Activity: Patient Declined (07/01/2023)   Exercise Vital Sign    Days of Exercise per Week: Patient declined    Minutes of Exercise per Session: Patient declined  Stress: No Stress Concern Present (07/01/2023)   [employer] of Occupational Health - Occupational Stress Questionnaire    Feeling of Stress : Not at all  Social Connections: Unknown (07/01/2023)   Social Connection and Isolation Panel [NHANES]    Frequency of Communication with Friends and Family: Once a week    Frequency of Social Gatherings with Friends and Family: Once a week    Attends Religious Services: Not on Marketing executive or Organizations: No    Attends Banker Meetings: Never    Marital Status: Living with partner    Tobacco Counseling Ready to quit: Not Answered Counseling given: Not Answered   Clinical Intake:  Pre-visit preparation completed: Yes  Pain : No/denies pain     BMI - recorded: 33.6 Nutritional Status: BMI > 30  Obese Nutritional Risks: None Diabetes: No  How often do you need to have someone help you when you read instructions, pamphlets, or other written materials from your doctor or pharmacy?: 1 - Never  Interpreter Needed?: No  Information entered by :: Bobbye Petti, RMA   Activities of Daily Living    07/01/2023    7:41 PM  In your present state of health, do you have any difficulty performing the following activities:  Hearing? 0  Vision? 0  Difficulty concentrating or making decisions? 0  Walking or climbing stairs? 0  Dressing or bathing? 0  Doing errands, shopping? 0  Preparing  Food and eating ? N  Using the Toilet? N  In the past six months, have you accidently leaked urine? N  Do you have problems with loss of bowel control? N  Managing your Medications? N  Managing your Finances? N  Housekeeping or managing your Housekeeping? N    Patient Care Team: Georgina Quint, MD as PCP - General (Internal Medicine) Corky Crafts, MD as PCP - Cardiology (Cardiology) Dr. Roselyn Meier (Optometry)  Indicate any recent Medical Services you may have received from other than [facility] providers in the past year (date may be approximate).     Assessment:   This is a routine wellness examination for Craig Knapp.  Hearing/Vision screen Hearing Screening - Comments:: Denies hearing difficulties  Vision Screening - Comments:: Denies vision issues   Goals Addressed               This Visit's Progress     Patient Stated (pt-stated)        To keep living and staying healthy.      Depression Screen    07/02/2023    9:12 AM 03/03/2023   10:45 AM 12/30/2017   10:31 AM 04/14/2011    1:53 PM  PHQ 2/9 Scores  PHQ - 2 Score 0 0 0 0  PHQ- 9 Score 0       Fall Risk    07/01/2023    7:41 PM 03/03/2023   10:45 AM 12/30/2017   10:31 AM  Fall Risk   Falls in the past year? 0 0 No  Number falls in past yr: 0 0   Injury with Fall? 0 0   Risk for fall due to :  No Fall Risks   Follow up Falls evaluation completed;Falls prevention discussed Falls evaluation completed     MEDICARE RISK AT HOME: Medicare Risk at Home Any stairs in or around the home?: Yes If so, are there any without handrails?: No Home free of loose throw rugs in walkways, pet beds, electrical cords, etc?: No Adequate lighting in your home to reduce risk of falls?: Yes Life alert?: No Use of a cane, walker or w/c?: No Grab bars in the bathroom?: No Shower chair or bench in shower?: No Elevated toilet seat or a handicapped toilet?: No  TIMED UP AND GO:  Was the test performed?  No     Cognitive Function:        07/02/2023    9:10 AM  6CIT Screen  What time? 0 points  Count back from 20 0 points  Months in reverse 0 points  Repeat phrase 0 points    Immunizations Immunization History  Administered Date(s) Administered   Influenza Whole 07/09/2008   Influenza-Unspecified 07/19/2013, 07/19/2022   Unspecified SARS-COV-2 Vaccination 07/19/2022   Zoster Recombinant(Shingrix) 10/16/2021    TDAP status: Due, Education has been provided regarding the importance of this vaccine. Advised may receive this vaccine at local pharmacy or Health Dept. Aware to provide a copy of the vaccination record if obtained from local pharmacy or Health Dept. Verbalized acceptance and understanding.  Flu Vaccine status: Due, Education has been provided regarding the importance of this vaccine. Advised may receive this vaccine at local pharmacy or Health Dept. Aware to provide a copy of the vaccination record if obtained from local pharmacy or Health Dept. Verbalized acceptance and understanding.  Pneumococcal vaccine status: Due, Education has been provided regarding the importance of this vaccine. Advised may receive this vaccine at local pharmacy or Health Dept. Aware to provide a copy of the vaccination record if obtained from local pharmacy or Health Dept. Verbalized acceptance and understanding.  Covid-19 vaccine status: Information provided on how to obtain vaccines.   Qualifies for Shingles Vaccine? Yes   Zostavax completed No   Shingrix Completed?: No.    Education has been provided regarding the importance of this vaccine. Patient has been advised to call insurance company to determine out of pocket expense if they have not yet received this vaccine. Advised may also receive vaccine at local pharmacy or Health Dept. Verbalized acceptance and understanding.  Screening Tests Health Maintenance  Topic Date Due   Hepatitis C Screening  Never done   Pneumonia Vaccine 106+ Years old  (1 of 1 - PCV)  Never done   Zoster Vaccines- Shingrix (2 of 2) 12/11/2021   COVID-19 Vaccine (2 - Mixed Product risk series) 08/16/2022   INFLUENZA VACCINE  05/20/2023   Medicare Annual Wellness (AWV)  07/01/2024   Colonoscopy  04/13/2032   HPV VACCINES  Aged Out   DTaP/Tdap/Td  Discontinued    Health Maintenance  Health Maintenance Due  Topic Date Due   Hepatitis C Screening  Never done   Pneumonia Vaccine 31+ Years old (1 of 1 - PCV) Never done   Zoster Vaccines- Shingrix (2 of 2) 12/11/2021   COVID-19 Vaccine (2 - Mixed Product risk series) 08/16/2022   INFLUENZA VACCINE  05/20/2023    Colorectal cancer screening: Type of screening: Colonoscopy. Completed 04/13/2022. Repeat every 10 years  Lung Cancer Screening: (Low Dose CT Chest recommended if Age 22-80 years, 20 pack-year currently smoking OR have quit w/in 15years.) does not qualify.   Lung Cancer Screening Referral: N/A  Additional Screening:  Hepatitis C Screening: does qualify;   Vision Screening: Recommended annual ophthalmology exams for early detection of glaucoma and other disorders of the eye. Is the patient up to date with their annual eye exam?  No  Who is the provider or what is the name of the office in which the patient attends annual eye exams? [facility] If pt is not established with a provider, would they like to be referred to a provider to establish care? No .   Dental Screening: Recommended annual dental exams for proper oral hygiene   Community Resource Referral / Chronic Care Management: CRR required this visit?  No   CCM required this visit?  No     Plan:     I have personally reviewed and noted the following in the patient's chart:   Medical and social history Use of alcohol, tobacco or illicit drugs  Current medications and supplements including opioid prescriptions. Patient is not currently taking opioid prescriptions. Functional ability and status Nutritional  status Physical activity Advanced directives List of other physicians Hospitalizations, surgeries, and ER visits in previous 12 months Vitals Screenings to include cognitive, depression, and falls Referrals and appointments  In addition, I have reviewed and discussed with patient certain preventive protocols, quality metrics, and best practice recommendations. A written personalized care plan for preventive services as well as general preventive health recommendations were provided to patient.     Alphonza Tramell L Blayton Huttner, CMA   07/02/2023   After Visit Summary: (MyChart) Due to this being a telephonic visit, the after visit summary with patients personalized plan was offered to patient via MyChart   Nurse Notes: Patient is due for a Tdap and Shingrix vaccine, however he declines them both.  Patient stated that he had gotten his Pneumonia vaccines before but I see no record of them in [facility].  He will soon get the Flu and Covid vaccines at his local pharmacy.  Patient is also due for a Hep C screening, which he is scheduled for a CPE next week and will have screening at that time.  He stated that he is behind on his yearly eye examination but will soon schedule that.  Patient had no other concerns to address today.

## 2023-07-02 NOTE — Patient Instructions (Signed)
Mr. Goodridge , Thank you for taking time to come for your Medicare Wellness Visit. I appreciate your ongoing commitment to your health goals. Please review the following plan we discussed and let me know if I can assist you in the future.   Referrals/Orders/Follow-Ups/Clinician Recommendations: Remember that you are due for a Hep C screening during your up coming physical with PCP.  Also, do not forget about scheduling your yearly eye exam.  Keep up the work.   This is a list of the screening recommended for you and due dates:  Health Maintenance  Topic Date Due   Hepatitis C Screening  Never done   Pneumonia Vaccine (1 of 1 - PCV) Never done   Zoster (Shingles) Vaccine (2 of 2) 12/11/2021   COVID-19 Vaccine (2 - Mixed Product risk series) 08/16/2022   Flu Shot  05/20/2023   Medicare Annual Wellness Visit  07/01/2024   Colon Cancer Screening  04/13/2032   HPV Vaccine  Aged Out   DTaP/Tdap/Td vaccine  Discontinued    Advanced directives: (Copy Requested) Please bring a copy of your health care power of attorney and living will to the office to be added to your chart at your convenience.  Next Medicare Annual Wellness Visit scheduled for next year: Yes

## 2023-07-06 ENCOUNTER — Encounter: Payer: Self-pay | Admitting: Emergency Medicine

## 2023-07-06 ENCOUNTER — Ambulatory Visit (INDEPENDENT_AMBULATORY_CARE_PROVIDER_SITE_OTHER): Payer: Medicare Other | Admitting: Emergency Medicine

## 2023-07-06 VITALS — BP 114/78 | HR 55 | Temp 97.6°F | Ht 68.0 in | Wt 219.0 lb

## 2023-07-06 DIAGNOSIS — E785 Hyperlipidemia, unspecified: Secondary | ICD-10-CM

## 2023-07-06 DIAGNOSIS — I10 Essential (primary) hypertension: Secondary | ICD-10-CM | POA: Diagnosis not present

## 2023-07-06 DIAGNOSIS — I2510 Atherosclerotic heart disease of native coronary artery without angina pectoris: Secondary | ICD-10-CM | POA: Diagnosis not present

## 2023-07-06 DIAGNOSIS — R2689 Other abnormalities of gait and mobility: Secondary | ICD-10-CM | POA: Diagnosis not present

## 2023-07-06 LAB — LIPID PANEL
Cholesterol: 128 mg/dL (ref 0–200)
HDL: 70.9 mg/dL (ref >39.00)
LDL Cholesterol: 36 mg/dL (ref 0–99)
NonHDL: 56.7
Total CHOL/HDL Ratio: 2
Triglycerides: 102 mg/dL (ref 0.0–149.0)
VLDL: 20.4 mg/dL (ref 0.0–40.0)

## 2023-07-06 LAB — COMPREHENSIVE METABOLIC PANEL
ALT: 26 U/L (ref 0–53)
AST: 25 U/L (ref 0–37)
Albumin: 4.2 g/dL (ref 3.5–5.2)
Alkaline Phosphatase: 67 U/L (ref 39–117)
BUN: 15 mg/dL (ref 6–23)
CO2: 25 meq/L (ref 19–32)
Calcium: 9.5 mg/dL (ref 8.4–10.5)
Chloride: 99 meq/L (ref 96–112)
Creatinine, Ser: 1 mg/dL (ref 0.40–1.50)
GFR: 73.47 mL/min (ref >60.00)
Glucose, Bld: 104 mg/dL — ABNORMAL HIGH (ref 70–99)
Potassium: 4.4 meq/L (ref 3.5–5.1)
Sodium: 133 meq/L — ABNORMAL LOW (ref 135–145)
Total Bilirubin: 1.8 mg/dL — ABNORMAL HIGH (ref 0.2–1.2)
Total Protein: 7.2 g/dL (ref 6.0–8.3)

## 2023-07-06 NOTE — Assessment & Plan Note (Signed)
No recent anginal episodes. Stable chronic condition. Continues daily baby aspirin

## 2023-07-06 NOTE — Assessment & Plan Note (Signed)
Active and affecting quality of life No recent falls.  Fall precautions given. Recommend physical therapy Referral placed today

## 2023-07-06 NOTE — Patient Instructions (Signed)
Health Maintenance After Age 76 After age 76, you are at a higher risk for certain long-term diseases and infections as well as injuries from falls. Falls are a major cause of broken bones and head injuries in people who are older than age 76. Getting regular preventive care can help to keep you healthy and well. Preventive care includes getting regular testing and making lifestyle changes as recommended by your health care provider. Talk with your health care provider about: Which screenings and tests you should have. A screening is a test that checks for a disease when you have no symptoms. A diet and exercise plan that is right for you. What should I know about screenings and tests to prevent falls? Screening and testing are the best ways to find a health problem early. Early diagnosis and treatment give you the best chance of managing medical conditions that are common after age 76. Certain conditions and lifestyle choices may make you more likely to have a fall. Your health care provider may recommend: Regular vision checks. Poor vision and conditions such as cataracts can make you more likely to have a fall. If you wear glasses, make sure to get your prescription updated if your vision changes. Medicine review. Work with your health care provider to regularly review all of the medicines you are taking, including over-the-counter medicines. Ask your health care provider about any side effects that may make you more likely to have a fall. Tell your health care provider if any medicines that you take make you feel dizzy or sleepy. Strength and balance checks. Your health care provider may recommend certain tests to check your strength and balance while standing, walking, or changing positions. Foot health exam. Foot pain and numbness, as well as not wearing proper footwear, can make you more likely to have a fall. Screenings, including: Osteoporosis screening. Osteoporosis is a condition that causes  the bones to get weaker and break more easily. Blood pressure screening. Blood pressure changes and medicines to control blood pressure can make you feel dizzy. Depression screening. You may be more likely to have a fall if you have a fear of falling, feel depressed, or feel unable to do activities that you used to do. Alcohol use screening. Using too much alcohol can affect your balance and may make you more likely to have a fall. Follow these instructions at home: Lifestyle Do not drink alcohol if: Your health care provider tells you not to drink. If you drink alcohol: Limit how much you have to: 0-1 drink a day for women. 0-2 drinks a day for men. Know how much alcohol is in your drink. In the [place] one drink equals one 12 oz bottle of beer (355 mL), one 5 oz glass of wine (148 mL), or one 1 oz glass of hard liquor (44 mL). Do not use any products that contain nicotine or tobacco. These products include cigarettes, chewing tobacco, and vaping devices, such as e-cigarettes. If you need help quitting, ask your health care provider. Activity  Follow a regular exercise program to stay fit. This will help you maintain your balance. Ask your health care provider what types of exercise are appropriate for you. If you need a cane or walker, use it as recommended by your health care provider. Wear supportive shoes that have nonskid soles. Safety  Remove any tripping hazards, such as rugs, cords, and clutter. Install safety equipment such as grab bars in bathrooms and safety rails on stairs. Keep rooms and walkways   well-lit. General instructions Talk with your health care provider about your risks for falling. Tell your health care provider if: You fall. Be sure to tell your health care provider about all falls, even ones that seem minor. You feel dizzy, tiredness (fatigue), or off-balance. Take over-the-counter and prescription medicines only as told by your health care provider. These include  supplements. Eat a healthy diet and maintain a healthy weight. A healthy diet includes low-fat dairy products, low-fat (lean) meats, and fiber from whole grains, beans, and lots of fruits and vegetables. Stay current with your vaccines. Schedule regular health, dental, and eye exams. Summary Having a healthy lifestyle and getting preventive care can help to protect your health and wellness after age 76. Screening and testing are the best way to find a health problem early and help you avoid having a fall. Early diagnosis and treatment give you the best chance for managing medical conditions that are more common for people who are older than age 76. Falls are a major cause of broken bones and head injuries in people who are older than age 76. Take precautions to prevent a fall at home. Work with your health care provider to learn what changes you can make to improve your health and wellness and to prevent falls. This information is not intended to replace advice given to you by your health care provider. Make sure you discuss any questions you have with your health care provider. Document Revised: 02/24/2021 Document Reviewed: 02/24/2021 Elsevier Patient Education  2024 [employer].  

## 2023-07-06 NOTE — Assessment & Plan Note (Signed)
BP Readings from Last 3 Encounters:  07/06/23 114/78  04/06/23 126/62  03/03/23 130/68  Well-controlled hypertension Continue amlodipine 10 mg daily and lisinopril 20 mg daily Cardiovascular risks associated with hypertension discussed Diet and nutrition discussed Benefits of exercise discussed

## 2023-07-06 NOTE — Assessment & Plan Note (Signed)
Chronic stable condition Continue atorvastatin 80 mg daily Diet and nutrition discussed

## 2023-07-06 NOTE — Progress Notes (Signed)
Craig Knapp 77 y.o.   Chief Complaint  Patient presents with   Annual Exam    HISTORY OF PRESENT ILLNESS: This is a 77 y.o. male here for follow-up of chronic medical conditions including hypertension, CAD, and dyslipidemia Concerned about balance issues.  No falls. Overall doing well.  Has no complaints or medical concerns today.  HPI   Prior to Admission medications   Medication Sig Start Date End Date Taking? Authorizing Provider  amLODipine (NORVASC) 10 MG tablet Take 1 tablet (10 mg total) by mouth daily. 03/30/23  Yes Dunn, Tacey Ruiz, PA-C  aspirin 81 MG tablet Take 81 mg by mouth daily.   Yes [provider]  atorvastatin (LIPITOR) 80 MG tablet Take 1 tablet (80 mg total) by mouth daily. 03/16/23  Yes Corky Crafts, MD  fluticasone [facility]) 50 MCG/ACT nasal spray Place into both nostrils as needed for allergies or rhinitis.   Yes [provider]  lisinopril (ZESTRIL) 20 MG tablet TAKE 1 TABLET BY MOUTH TWICE A DAY 04/29/23  Yes Corky Crafts, MD  naproxen sodium (ANAPROX) 220 MG tablet Take 220 mg by mouth as needed (PAIN).    Yes [provider]  Omega-3 Fatty Acids (FISH OIL) 1000 MG CAPS Take 2 capsules by mouth daily.   Yes [provider]    Allergies  Allergen Reactions   Codeine Nausea Only    "Crazy feeling in my head"    Patient Active Problem List   Diagnosis Date Noted   Obesity, unspecified 07/03/2014   Hypogonadism male 03/27/2011   Dyslipidemia 08/06/2008   Essential hypertension 08/06/2008   Coronary atherosclerosis 08/06/2008   Chronic osteomyelitis of lower leg (HCC) 08/06/2008   CORONARY ARTERY BYPASS GRAFT, HX OF 08/06/2008   HLD (hyperlipidemia) 08/06/2008    Past Medical History:  Diagnosis Date   Coronary atherosclerosis of native coronary artery    CABG 2006, EDMUNDS   DJD (degenerative joint disease) `   back and knees   Essential hypertension, benign    History of nuclear stress test     11/08 7 mets, no ischemia, EF 73%   Male hypogonadism    Osteomyelitis (HCC)    R tibia 1972, recur 2005   Other and unspecified hyperlipidemia     Past Surgical History:  Procedure Laterality Date   CORONARY ARTERY BYPASS GRAFT  2006   SPINE SURGERY  01/18/2023   (laminectomy surgery) L3,4,5    Social History   Socioeconomic History   Marital status: Divorced    Spouse name: Not on file   Number of children: 3   Years of education: Not on file   Highest education level: Not on file  Occupational History   Occupation: Retired  Tobacco Use   Smoking status: Never   Smokeless tobacco: Current    Types: Snuff  Vaping Use   Vaping status: Never Used  Substance and Sexual Activity   Alcohol use: Yes    Alcohol/week: 7.0 standard drinks of alcohol    Types: 7 Standard drinks or equivalent per week    Comment: daily in afternoons   Drug use: No   Sexual activity: Not Currently  Other Topics Concern   Not on file  Social History Narrative   Does not live alone, has a partner   Social Determinants of Health   Financial Resource Strain: Medium Risk (07/01/2023)   Overall Financial Resource Strain (CARDIA)    Difficulty of Paying Living Expenses: Somewhat hard  Food Insecurity: No  Food Insecurity (07/01/2023)   Hunger Vital Sign    Worried About Running Out of Food in the Last Year: Never true    Ran Out of Food in the Last Year: Never true  Transportation Needs: No Transportation Needs (07/01/2023)   PRAPARE - Administrator, Civil Service (Medical): No    Lack of Transportation (Non-Medical): No  Physical Activity: Patient Declined (07/01/2023)   Exercise Vital Sign    Days of Exercise per Week: Patient declined    Minutes of Exercise per Session: Patient declined  Stress: No Stress Concern Present (07/01/2023)   [employer] of Occupational Health - Occupational Stress Questionnaire    Feeling of Stress : Not at all  Social Connections: Unknown  (07/01/2023)   Social Connection and Isolation Panel [NHANES]    Frequency of Communication with Friends and Family: Once a week    Frequency of Social Gatherings with Friends and Family: Once a week    Attends Religious Services: Not on Insurance claims handler of Clubs or Organizations: No    Attends Banker Meetings: Never    Marital Status: Living with partner  Intimate Partner Violence: Not At Risk (07/02/2023)   Humiliation, Afraid, Rape, and Kick questionnaire    Fear of Current or Ex-Partner: No    Emotionally Abused: No    Physically Abused: No    Sexually Abused: No    Family History  Problem Relation Age of Onset   Heart disease Mother        CABG   Lung cancer Mother    Alcoholism Father    Heart attack Neg Hx      Review of Systems  Constitutional: Negative.  Negative for chills and fever.  HENT: Negative.  Negative for congestion and sore throat.   Respiratory: Negative.  Negative for cough and shortness of breath.   Cardiovascular: Negative.  Negative for chest pain and palpitations.  Gastrointestinal:  Negative for abdominal pain, diarrhea, nausea and vomiting.  Genitourinary:  Negative for dysuria and hematuria.  Skin: Negative.  Negative for rash.  Neurological: Negative.  Negative for dizziness and headaches.  All other systems reviewed and are negative.   Vitals:   07/06/23 1049  BP: 114/78  Pulse: (!) 55  Temp: 97.6 F (36.4 C)  SpO2: 98%    Physical Exam Vitals reviewed.  Constitutional:      Appearance: Normal appearance.  HENT:     Head: Normocephalic.     Mouth/Throat:     Mouth: Mucous membranes are moist.     Pharynx: Oropharynx is clear.  Eyes:     Extraocular Movements: Extraocular movements intact.     Conjunctiva/sclera: Conjunctivae normal.     Pupils: Pupils are equal, round, and reactive to light.  Cardiovascular:     Rate and Rhythm: Normal rate and regular rhythm.     Pulses: Normal pulses.     Heart sounds:  Normal heart sounds.  Pulmonary:     Effort: Pulmonary effort is normal.     Breath sounds: Normal breath sounds.  Abdominal:     Palpations: Abdomen is soft.     Tenderness: There is no abdominal tenderness.  Musculoskeletal:     Cervical back: No tenderness.  Lymphadenopathy:     Cervical: No cervical adenopathy.  Skin:    General: Skin is warm and dry.     Capillary Refill: Capillary refill takes less than 2 seconds.  Neurological:     General:  No focal deficit present.     Mental Status: He is alert and oriented to person, place, and time.  Psychiatric:        Mood and Affect: Mood normal.        Behavior: Behavior normal.      ASSESSMENT & PLAN: A total of 44 minutes was spent with the patient and counseling/coordination of care regarding preparing for this visit, review of most recent office visit notes, review of most recent cardiologist office visit notes, review of chronic medical conditions under management, review of all medications, review of most recent blood work results, cardiovascular risks associated with hypertension and dyslipidemia, balance issues and need for physical therapy, education on nutrition, prognosis, documentation, and need for follow-up.  Problem List Items Addressed This Visit       Cardiovascular and Mediastinum   Essential hypertension - Primary    BP Readings from Last 3 Encounters:  07/06/23 114/78  04/06/23 126/62  03/03/23 130/68  Well-controlled hypertension Continue amlodipine 10 mg daily and lisinopril 20 mg daily Cardiovascular risks associated with hypertension discussed Diet and nutrition discussed Benefits of exercise discussed       Relevant Orders   Comprehensive metabolic panel   Lipid panel   Coronary atherosclerosis    No recent anginal episodes. Stable chronic condition. Continues daily baby aspirin        Other   Dyslipidemia    Chronic stable condition Continue atorvastatin 80 mg daily Diet and nutrition  discussed      Relevant Orders   Comprehensive metabolic panel   Lipid panel   Balance problem    Active and affecting quality of life No recent falls.  Fall precautions given. Recommend physical therapy Referral placed today      Relevant Orders   PT gait training   Patient Instructions  Health Maintenance After Age 71 After age 61, you are at a higher risk for certain long-term diseases and infections as well as injuries from falls. Falls are a major cause of broken bones and head injuries in people who are older than age 99. Getting regular preventive care can help to keep you healthy and well. Preventive care includes getting regular testing and making lifestyle changes as recommended by your health care provider. Talk with your health care provider about: Which screenings and tests you should have. A screening is a test that checks for a disease when you have no symptoms. A diet and exercise plan that is right for you. What should I know about screenings and tests to prevent falls? Screening and testing are the best ways to find a health problem early. Early diagnosis and treatment give you the best chance of managing medical conditions that are common after age 37. Certain conditions and lifestyle choices may make you more likely to have a fall. Your health care provider may recommend: Regular vision checks. Poor vision and conditions such as cataracts can make you more likely to have a fall. If you wear glasses, make sure to get your prescription updated if your vision changes. Medicine review. Work with your health care provider to regularly review all of the medicines you are taking, including over-the-counter medicines. Ask your health care provider about any side effects that may make you more likely to have a fall. Tell your health care provider if any medicines that you take make you feel dizzy or sleepy. Strength and balance checks. Your health care provider may recommend  certain tests to check your strength and  balance while standing, walking, or changing positions. Foot health exam. Foot pain and numbness, as well as not wearing proper footwear, can make you more likely to have a fall. Screenings, including: Osteoporosis screening. Osteoporosis is a condition that causes the bones to get weaker and break more easily. Blood pressure screening. Blood pressure changes and medicines to control blood pressure can make you feel dizzy. Depression screening. You may be more likely to have a fall if you have a fear of falling, feel depressed, or feel unable to do activities that you used to do. Alcohol use screening. Using too much alcohol can affect your balance and may make you more likely to have a fall. Follow these instructions at home: Lifestyle Do not drink alcohol if: Your health care provider tells you not to drink. If you drink alcohol: Limit how much you have to: 0-1 drink a day for women. 0-2 drinks a day for men. Know how much alcohol is in your drink. In the [place] one drink equals one 12 oz bottle of beer (355 mL), one 5 oz glass of wine (148 mL), or one 1 oz glass of hard liquor (44 mL). Do not use any products that contain nicotine or tobacco. These products include cigarettes, chewing tobacco, and vaping devices, such as e-cigarettes. If you need help quitting, ask your health care provider. Activity  Follow a regular exercise program to stay fit. This will help you maintain your balance. Ask your health care provider what types of exercise are appropriate for you. If you need a cane or walker, use it as recommended by your health care provider. Wear supportive shoes that have nonskid soles. Safety  Remove any tripping hazards, such as rugs, cords, and clutter. Install safety equipment such as grab bars in bathrooms and safety rails on stairs. Keep rooms and walkways well-lit. General instructions Talk with your health care provider about your  risks for falling. Tell your health care provider if: You fall. Be sure to tell your health care provider about all falls, even ones that seem minor. You feel dizzy, tiredness (fatigue), or off-balance. Take over-the-counter and prescription medicines only as told by your health care provider. These include supplements. Eat a healthy diet and maintain a healthy weight. A healthy diet includes low-fat dairy products, low-fat (lean) meats, and fiber from whole grains, beans, and lots of fruits and vegetables. Stay current with your vaccines. Schedule regular health, dental, and eye exams. Summary Having a healthy lifestyle and getting preventive care can help to protect your health and wellness after age 61. Screening and testing are the best way to find a health problem early and help you avoid having a fall. Early diagnosis and treatment give you the best chance for managing medical conditions that are more common for people who are older than age 36. Falls are a major cause of broken bones and head injuries in people who are older than age 78. Take precautions to prevent a fall at home. Work with your health care provider to learn what changes you can make to improve your health and wellness and to prevent falls. This information is not intended to replace advice given to you by your health care provider. Make sure you discuss any questions you have with your health care provider. Document Revised: 02/24/2021 Document Reviewed: 02/24/2021 Elsevier Patient Education  2024 [employer]      Edwina Barth, MD [facility]

## 2023-07-19 DIAGNOSIS — H35033 Hypertensive retinopathy, bilateral: Secondary | ICD-10-CM | POA: Diagnosis not present

## 2023-07-26 ENCOUNTER — Other Ambulatory Visit: Payer: Self-pay

## 2023-07-26 MED ORDER — AMLODIPINE BESYLATE 10 MG PO TABS: 10.0000 mg | ORAL_TABLET | Freq: Every day | ORAL | 2 refills | Status: DC

## 2023-08-07 DIAGNOSIS — Z23 Encounter for immunization: Secondary | ICD-10-CM | POA: Diagnosis not present

## 2024-03-10 ENCOUNTER — Other Ambulatory Visit: Payer: Self-pay | Admitting: Interventional Cardiology

## 2024-03-28 ENCOUNTER — Other Ambulatory Visit: Payer: Self-pay | Admitting: Interventional Cardiology

## 2024-04-11 ENCOUNTER — Ambulatory Visit: Payer: Self-pay | Admitting: Emergency Medicine

## 2024-04-11 ENCOUNTER — Encounter: Payer: Self-pay | Admitting: Emergency Medicine

## 2024-04-11 ENCOUNTER — Ambulatory Visit (INDEPENDENT_AMBULATORY_CARE_PROVIDER_SITE_OTHER): Admitting: Emergency Medicine

## 2024-04-11 VITALS — BP 132/64 | HR 59 | Temp 98.0°F | Ht 68.0 in | Wt 226.0 lb

## 2024-04-11 DIAGNOSIS — E785 Hyperlipidemia, unspecified: Secondary | ICD-10-CM

## 2024-04-11 DIAGNOSIS — I10 Essential (primary) hypertension: Secondary | ICD-10-CM

## 2024-04-11 DIAGNOSIS — R351 Nocturia: Secondary | ICD-10-CM

## 2024-04-11 DIAGNOSIS — R399 Unspecified symptoms and signs involving the genitourinary system: Secondary | ICD-10-CM

## 2024-04-11 LAB — LIPID PANEL
Cholesterol: 125 mg/dL (ref 0–200)
HDL: 68.7 mg/dL (ref >39.00)
LDL Cholesterol: 36 mg/dL (ref 0–99)
NonHDL: 55.86
Total CHOL/HDL Ratio: 2
Triglycerides: 100 mg/dL (ref 0.0–149.0)
VLDL: 20 mg/dL (ref 0.0–40.0)

## 2024-04-11 LAB — COMPREHENSIVE METABOLIC PANEL WITH GFR
ALT: 25 U/L (ref 0–53)
AST: 23 U/L (ref 0–37)
Albumin: 4.3 g/dL (ref 3.5–5.2)
Alkaline Phosphatase: 61 U/L (ref 39–117)
BUN: 13 mg/dL (ref 6–23)
CO2: 29 meq/L (ref 19–32)
Calcium: 9.3 mg/dL (ref 8.4–10.5)
Chloride: 99 meq/L (ref 96–112)
Creatinine, Ser: 0.88 mg/dL (ref 0.40–1.50)
GFR: 83.48 mL/min (ref >60.00)
Glucose, Bld: 156 mg/dL — ABNORMAL HIGH (ref 70–99)
Potassium: 4.4 meq/L (ref 3.5–5.1)
Sodium: 134 meq/L — ABNORMAL LOW (ref 135–145)
Total Bilirubin: 1.5 mg/dL — ABNORMAL HIGH (ref 0.2–1.2)
Total Protein: 6.7 g/dL (ref 6.0–8.3)

## 2024-04-11 LAB — PSA: PSA: 1.2 ng/mL (ref 0.10–4.00)

## 2024-04-11 MED ORDER — ATORVASTATIN CALCIUM 80 MG PO TABS: 80.0000 mg | ORAL_TABLET | Freq: Every day | ORAL | 3 refills | Status: AC

## 2024-04-11 MED ORDER — AMLODIPINE BESYLATE 10 MG PO TABS: 10.0000 mg | ORAL_TABLET | Freq: Every day | ORAL | 3 refills | Status: AC

## 2024-04-11 MED ORDER — LISINOPRIL 20 MG PO TABS: 20.0000 mg | ORAL_TABLET | Freq: Two times a day (BID) | ORAL | 3 refills | Status: AC

## 2024-04-11 NOTE — Progress Notes (Signed)
 Craig Knapp 78 y.o.   Chief Complaint  Patient presents with  . Medication Refill    Patient here wanting to know if PCP can take over his medications from his cardiologist. He says its hard to get in touch with some one there when he needs a refill. Lipitor, lisinopril , amlodipine      HISTORY OF PRESENT ILLNESS: This is a 78 y.o. male A1A here for follow-up of hypertension and medication refills BP Readings from Last 3 Encounters:  04/11/24 132/64  07/06/23 114/78  04/06/23 126/62     Medication Refill Pertinent negatives include no abdominal pain, chest pain, chills, congestion, coughing, fever, headaches, nausea, rash, sore throat or vomiting.     Prior to Admission medications   Medication Sig Start Date End Date Taking? Authorizing Provider  amLODipine  (NORVASC ) 10 MG tablet Take 1 tablet (10 mg total) by mouth daily. 07/26/23  Yes Dann Candyce RAMAN, MD  aspirin 81 MG tablet Take 81 mg by mouth daily.   Yes [provider]  atorvastatin  (LIPITOR) 80 MG tablet Take 1 tablet (80 mg total) by mouth daily. PT. MUST MAKE AN APPOINTMENT IN ORDER TO RECEIVE FUTURE REFILLS. FIRST ATTEMPT. 03/28/24  Yes Dann Candyce RAMAN, MD  fluticasone (FLONASE) 50 MCG/ACT nasal spray Place into both nostrils as needed for allergies or rhinitis.   Yes [provider]  lisinopril  (ZESTRIL ) 20 MG tablet TAKE 1 TABLET BY MOUTH TWICE A DAY 04/29/23  Yes Dann Candyce RAMAN, MD  naproxen sodium (ANAPROX) 220 MG tablet Take 220 mg by mouth as needed (PAIN).    Yes [provider]  Omega-3 Fatty Acids (FISH OIL) 1000 MG CAPS Take 2 capsules by mouth daily.   Yes [provider]    Allergies  Allergen Reactions  . Codeine Nausea Only    Crazy feeling in my head    Patient Active Problem List   Diagnosis Date Noted  . Balance problem 07/06/2023  . Obesity, unspecified 07/03/2014  . Hypogonadism male 03/27/2011  . Dyslipidemia 08/06/2008  . Essential  hypertension 08/06/2008  . Coronary atherosclerosis 08/06/2008  . Chronic osteomyelitis of lower leg (HCC) 08/06/2008  . CORONARY ARTERY BYPASS GRAFT, HX OF 08/06/2008  . HLD (hyperlipidemia) 08/06/2008    Past Medical History:  Diagnosis Date  . Coronary atherosclerosis of native coronary artery    CABG 2006, EDMUNDS  . DJD (degenerative joint disease) `   back and knees  . Essential hypertension, benign   . History of nuclear stress test    11/08 7 mets, no ischemia, EF 73%  . Male hypogonadism   . Osteomyelitis (HCC)    R tibia 1972, recur 2005  . Other and unspecified hyperlipidemia     Past Surgical History:  Procedure Laterality Date  . CORONARY ARTERY BYPASS GRAFT  2006  . SPINE SURGERY  01/18/2023   (laminectomy surgery) L3,4,5    Social History   Socioeconomic History  . Marital status: Divorced    Spouse name: Not on file  . Number of children: 3  . Years of education: Not on file  . Highest education level: Not on file  Occupational History  . Occupation: Retired  Tobacco Use  . Smoking status: Never  . Smokeless tobacco: Current    Types: Snuff  Vaping Use  . Vaping status: Never Used  Substance and Sexual Activity  . Alcohol use: Yes    Alcohol/week: 7.0 standard drinks of alcohol    Types: 7 Standard drinks or equivalent per  week    Comment: daily in afternoons  . Drug use: No  . Sexual activity: Not Currently  Other Topics Concern  . Not on file  Social History Narrative   Does not live alone, has a partner   Social Drivers of Corporate investment banker Strain: Medium Risk (07/01/2023)   Overall Financial Resource Strain (CARDIA)   . Difficulty of Paying Living Expenses: Somewhat hard  Food Insecurity: No Food Insecurity (07/01/2023)   Hunger Vital Sign   . Worried About Programme researcher, broadcasting/film/video in the Last Year: Never true   . Ran Out of Food in the Last Year: Never true  Transportation Needs: No Transportation Needs (07/01/2023)   PRAPARE -  Transportation   . Lack of Transportation (Medical): No   . Lack of Transportation (Non-Medical): No  Physical Activity: Patient Declined (07/01/2023)   Exercise Vital Sign   . Days of Exercise per Week: Patient declined   . Minutes of Exercise per Session: Patient declined  Stress: No Stress Concern Present (07/01/2023)   [employer] of Occupational Health - Occupational Stress Questionnaire   . Feeling of Stress : Not at all  Social Connections: Unknown (07/01/2023)   Social Connection and Isolation Panel   . Frequency of Communication with Friends and Family: Once a week   . Frequency of Social Gatherings with Friends and Family: Once a week   . Attends Religious Services: Not on file   . Active Member of Clubs or Organizations: No   . Attends Banker Meetings: Never   . Marital Status: Living with partner  Intimate Partner Violence: Not At Risk (07/02/2023)   Humiliation, Afraid, Rape, and Kick questionnaire   . Fear of Current or Ex-Partner: No   . Emotionally Abused: No   . Physically Abused: No   . Sexually Abused: No    Family History  Problem Relation Age of Onset  . Heart disease Mother        CABG  . Lung cancer Mother   . Alcoholism Father   . Heart attack Neg Hx      Review of Systems  Constitutional: Negative.  Negative for chills and fever.  HENT: Negative.  Negative for congestion and sore throat.   Respiratory: Negative.  Negative for cough and shortness of breath.   Cardiovascular: Negative.  Negative for chest pain and palpitations.  Gastrointestinal:  Negative for abdominal pain, diarrhea, nausea and vomiting.  Genitourinary: Negative.  Negative for dysuria and hematuria.  Skin: Negative.  Negative for rash.  Neurological: Negative.  Negative for dizziness and headaches.  All other systems reviewed and are negative.   Vitals:   04/11/24 1048  BP: 132/64  Pulse: (!) 59  Temp: 98 F (36.7 C)  SpO2: 96%    Physical Exam Vitals  reviewed.  Constitutional:      Appearance: Normal appearance.  HENT:     Head: Normocephalic.   Eyes:     Extraocular Movements: Extraocular movements intact.     Pupils: Pupils are equal, round, and reactive to light.    Cardiovascular:     Rate and Rhythm: Normal rate and regular rhythm.     Pulses: Normal pulses.     Heart sounds: Normal heart sounds.  Pulmonary:     Effort: Pulmonary effort is normal.     Breath sounds: Normal breath sounds.   Skin:    General: Skin is warm and dry.   Neurological:     Mental Status:  He is alert and oriented to person, place, and time.   Psychiatric:        Mood and Affect: Mood normal.        Behavior: Behavior normal.     ASSESSMENT & PLAN: A total of 44 minutes was spent with the patient and counseling/coordination of care regarding preparing for this visit, review of most recent office visit notes, review of multiple chronic medical conditions and their management, cardiovascular risks associated with hypertension and dyslipidemia, review of all medications, review of most recent bloodwork results, review of health maintenance items, education on nutrition, prognosis, documentation, and need for follow up.   Problem List Items Addressed This Visit       Cardiovascular and Mediastinum   Essential hypertension - Primary   BP Readings from Last 3 Encounters:  04/11/24 132/64  07/06/23 114/78  04/06/23 126/62  Well-controlled hypertension Continue amlodipine  10 mg daily and lisinopril  20 mg daily Cardiovascular risks associated with hypertension discussed Diet and nutrition discussed Benefits of exercise discussed       Relevant Medications   lisinopril  (ZESTRIL ) 20 MG tablet   amLODipine  (NORVASC ) 10 MG tablet   atorvastatin  (LIPITOR) 80 MG tablet   Other Relevant Orders   Comprehensive metabolic panel with GFR   Lipid panel     Other   Dyslipidemia   Chronic stable condition Continue atorvastatin  80 mg daily Diet  and nutrition discussed      Relevant Medications   atorvastatin  (LIPITOR) 80 MG tablet   Other Relevant Orders   Comprehensive metabolic panel with GFR   Lipid panel   Other Visit Diagnoses       Lower urinary tract symptoms       Relevant Orders   PSA     Nocturia       Relevant Orders   PSA      Patient Instructions  Health Maintenance After Age 1 After age 66, you are at a higher risk for certain long-term diseases and infections as well as injuries from falls. Falls are a major cause of broken bones and head injuries in people who are older than age 7. Getting regular preventive care can help to keep you healthy and well. Preventive care includes getting regular testing and making lifestyle changes as recommended by your health care provider. Talk with your health care provider about: Which screenings and tests you should have. A screening is a test that checks for a disease when you have no symptoms. A diet and exercise plan that is right for you. What should I know about screenings and tests to prevent falls? Screening and testing are the best ways to find a health problem early. Early diagnosis and treatment give you the best chance of managing medical conditions that are common after age 35. Certain conditions and lifestyle choices may make you more likely to have a fall. Your health care provider may recommend: Regular vision checks. Poor vision and conditions such as cataracts can make you more likely to have a fall. If you wear glasses, make sure to get your prescription updated if your vision changes. Medicine review. Work with your health care provider to regularly review all of the medicines you are taking, including over-the-counter medicines. Ask your health care provider about any side effects that may make you more likely to have a fall. Tell your health care provider if any medicines that you take make you feel dizzy or sleepy. Strength and balance checks. Your  health care provider  may recommend certain tests to check your strength and balance while standing, walking, or changing positions. Foot health exam. Foot pain and numbness, as well as not wearing proper footwear, can make you more likely to have a fall. Screenings, including: Osteoporosis screening. Osteoporosis is a condition that causes the bones to get weaker and break more easily. Blood pressure screening. Blood pressure changes and medicines to control blood pressure can make you feel dizzy. Depression screening. You may be more likely to have a fall if you have a fear of falling, feel depressed, or feel unable to do activities that you used to do. Alcohol use screening. Using too much alcohol can affect your balance and may make you more likely to have a fall. Follow these instructions at home: Lifestyle Do not drink alcohol if: Your health care provider tells you not to drink. If you drink alcohol: Limit how much you have to: 0-1 drink a day for women. 0-2 drinks a day for men. Know how much alcohol is in your drink. In the [place] one drink equals one 12 oz bottle of beer (355 mL), one 5 oz glass of wine (148 mL), or one 1 oz glass of hard liquor (44 mL). Do not use any products that contain nicotine or tobacco. These products include cigarettes, chewing tobacco, and vaping devices, such as e-cigarettes. If you need help quitting, ask your health care provider. Activity  Follow a regular exercise program to stay fit. This will help you maintain your balance. Ask your health care provider what types of exercise are appropriate for you. If you need a cane or walker, use it as recommended by your health care provider. Wear supportive shoes that have nonskid soles. Safety  Remove any tripping hazards, such as rugs, cords, and clutter. Install safety equipment such as grab bars in bathrooms and safety rails on stairs. Keep rooms and walkways well-lit. General instructions Talk with  your health care provider about your risks for falling. Tell your health care provider if: You fall. Be sure to tell your health care provider about all falls, even ones that seem minor. You feel dizzy, tiredness (fatigue), or off-balance. Take over-the-counter and prescription medicines only as told by your health care provider. These include supplements. Eat a healthy diet and maintain a healthy weight. A healthy diet includes low-fat dairy products, low-fat (lean) meats, and fiber from whole grains, beans, and lots of fruits and vegetables. Stay current with your vaccines. Schedule regular health, dental, and eye exams. Summary Having a healthy lifestyle and getting preventive care can help to protect your health and wellness after age 80. Screening and testing are the best way to find a health problem early and help you avoid having a fall. Early diagnosis and treatment give you the best chance for managing medical conditions that are more common for people who are older than age 51. Falls are a major cause of broken bones and head injuries in people who are older than age 78. Take precautions to prevent a fall at home. Work with your health care provider to learn what changes you can make to improve your health and wellness and to prevent falls. This information is not intended to replace advice given to you by your health care provider. Make sure you discuss any questions you have with your health care provider. Document Revised: 02/24/2021 Document Reviewed: 02/24/2021 Elsevier Patient Education  2024 Elsevier Inc.    Emil Schaumann, MD [facility]

## 2024-04-11 NOTE — Patient Instructions (Signed)
 Health Maintenance After Age 77 After age 4, you are at a higher risk for certain long-term diseases and infections as well as injuries from falls. Falls are a major cause of broken bones and head injuries in people who are older than age 47. Getting regular preventive care can help to keep you healthy and well. Preventive care includes getting regular testing and making lifestyle changes as recommended by your health care provider. Talk with your health care provider about: Which screenings and tests you should have. A screening is a test that checks for a disease when you have no symptoms. A diet and exercise plan that is right for you. What should I know about screenings and tests to prevent falls? Screening and testing are the best ways to find a health problem early. Early diagnosis and treatment give you the best chance of managing medical conditions that are common after age 37. Certain conditions and lifestyle choices may make you more likely to have a fall. Your health care provider may recommend: Regular vision checks. Poor vision and conditions such as cataracts can make you more likely to have a fall. If you wear glasses, make sure to get your prescription updated if your vision changes. Medicine review. Work with your health care provider to regularly review all of the medicines you are taking, including over-the-counter medicines. Ask your health care provider about any side effects that may make you more likely to have a fall. Tell your health care provider if any medicines that you take make you feel dizzy or sleepy. Strength and balance checks. Your health care provider may recommend certain tests to check your strength and balance while standing, walking, or changing positions. Foot health exam. Foot pain and numbness, as well as not wearing proper footwear, can make you more likely to have a fall. Screenings, including: Osteoporosis screening. Osteoporosis is a condition that causes  the bones to get weaker and break more easily. Blood pressure screening. Blood pressure changes and medicines to control blood pressure can make you feel dizzy. Depression screening. You may be more likely to have a fall if you have a fear of falling, feel depressed, or feel unable to do activities that you used to do. Alcohol use screening. Using too much alcohol can affect your balance and may make you more likely to have a fall. Follow these instructions at home: Lifestyle Do not drink alcohol if: Your health care provider tells you not to drink. If you drink alcohol: Limit how much you have to: 0-1 drink a day for women. 0-2 drinks a day for men. Know how much alcohol is in your drink. In the [place] one drink equals one 12 oz bottle of beer (355 mL), one 5 oz glass of wine (148 mL), or one 1 oz glass of hard liquor (44 mL). Do not use any products that contain nicotine or tobacco. These products include cigarettes, chewing tobacco, and vaping devices, such as e-cigarettes. If you need help quitting, ask your health care provider. Activity  Follow a regular exercise program to stay fit. This will help you maintain your balance. Ask your health care provider what types of exercise are appropriate for you. If you need a cane or walker, use it as recommended by your health care provider. Wear supportive shoes that have nonskid soles. Safety  Remove any tripping hazards, such as rugs, cords, and clutter. Install safety equipment such as grab bars in bathrooms and safety rails on stairs. Keep rooms and walkways  well-lit. General instructions Talk with your health care provider about your risks for falling. Tell your health care provider if: You fall. Be sure to tell your health care provider about all falls, even ones that seem minor. You feel dizzy, tiredness (fatigue), or off-balance. Take over-the-counter and prescription medicines only as told by your health care provider. These include  supplements. Eat a healthy diet and maintain a healthy weight. A healthy diet includes low-fat dairy products, low-fat (lean) meats, and fiber from whole grains, beans, and lots of fruits and vegetables. Stay current with your vaccines. Schedule regular health, dental, and eye exams. Summary Having a healthy lifestyle and getting preventive care can help to protect your health and wellness after age 11. Screening and testing are the best way to find a health problem early and help you avoid having a fall. Early diagnosis and treatment give you the best chance for managing medical conditions that are more common for people who are older than age 28. Falls are a major cause of broken bones and head injuries in people who are older than age 48. Take precautions to prevent a fall at home. Work with your health care provider to learn what changes you can make to improve your health and wellness and to prevent falls. This information is not intended to replace advice given to you by your health care provider. Make sure you discuss any questions you have with your health care provider. Document Revised: 02/24/2021 Document Reviewed: 02/24/2021 Elsevier Patient Education  2024 [employer].

## 2024-04-11 NOTE — Assessment & Plan Note (Signed)
 BP Readings from Last 3 Encounters:  04/11/24 132/64  07/06/23 114/78  04/06/23 126/62  Well-controlled hypertension Continue amlodipine  10 mg daily and lisinopril  20 mg daily Cardiovascular risks associated with hypertension discussed Diet and nutrition discussed Benefits of exercise discussed

## 2024-04-11 NOTE — Assessment & Plan Note (Signed)
Chronic stable condition Continue atorvastatin 80 mg daily Diet and nutrition discussed 

## 2024-04-12 NOTE — Telephone Encounter (Signed)
 No concerns at this time.  We can address that during next visit in 6 months.

## 2024-07-04 ENCOUNTER — Ambulatory Visit (INDEPENDENT_AMBULATORY_CARE_PROVIDER_SITE_OTHER): Payer: Medicare Other

## 2024-07-04 VITALS — Ht 68.0 in | Wt 226.0 lb

## 2024-07-04 DIAGNOSIS — Z1159 Encounter for screening for other viral diseases: Secondary | ICD-10-CM

## 2024-07-04 DIAGNOSIS — Z0000 Encounter for general adult medical examination without abnormal findings: Secondary | ICD-10-CM

## 2024-07-04 NOTE — Progress Notes (Signed)
 Subjective:   Craig Knapp is a 78 y.o. who presents for a Medicare Wellness preventive visit.  As a reminder, Annual Wellness Visits don't include a physical exam, and some assessments may be limited, especially if this visit is performed virtually. We may recommend an in-person follow-up visit with your provider if needed.  Visit Complete: Virtual I connected with  Charlie Pinal on 07/04/24 by a audio enabled telemedicine application and verified that I am speaking with the correct person using two identifiers.  Patient Location: Home  Provider Location: Office/Clinic  I discussed the limitations of evaluation and management by telemedicine. The patient expressed understanding and agreed to proceed.  Vital Signs: Because this visit was a virtual/telehealth visit, some criteria may be missing or patient reported. Any vitals not documented were not able to be obtained and vitals that have been documented are patient reported.  VideoDeclined- This patient declined Librarian, academic. Therefore the visit was completed with audio only.  Persons Participating in Visit: Patient.  AWV Questionnaire: Yes: Patient Medicare AWV questionnaire was completed by the patient on 07/03/2024; I have confirmed that all information answered by patient is correct and no changes since this date.  Cardiac Risk Factors include: dyslipidemia;hypertension;male gender;obesity (BMI >30kg/m2)     Objective:    Today's Vitals   07/04/24 1052  Weight: 226 lb (102.5 kg)  Height: 5' 8 (1.727 m)   Body mass index is 34.36 kg/m.     07/04/2024   10:52 AM 07/02/2023    9:10 AM  Advanced Directives  Does Patient Have a Medical Advance Directive? Yes Yes  Type of Estate agent of [place];Living will Healthcare Power of [place];Living will  Copy of Healthcare Power of Attorney in Chart? No - copy requested No - copy requested    Current Medications  (verified) Outpatient Encounter Medications as of 07/04/2024  Medication Sig   amLODipine  (NORVASC ) 10 MG tablet Take 1 tablet (10 mg total) by mouth daily.   aspirin 81 MG tablet Take 81 mg by mouth daily.   atorvastatin  (LIPITOR) 80 MG tablet Take 1 tablet (80 mg total) by mouth daily.   fluticasone (FLONASE) 50 MCG/ACT nasal spray Place into both nostrils as needed for allergies or rhinitis.   lisinopril  (ZESTRIL ) 20 MG tablet Take 1 tablet (20 mg total) by mouth 2 (two) times daily.   naproxen sodium (ANAPROX) 220 MG tablet Take 220 mg by mouth as needed (PAIN).    Omega-3 Fatty Acids (FISH OIL) 1000 MG CAPS Take 2 capsules by mouth daily.   No facility-administered encounter medications on file as of 07/04/2024.    Allergies (verified) Codeine   History: Past Medical History:  Diagnosis Date   Coronary atherosclerosis of native coronary artery    CABG 2006, EDMUNDS   DJD (degenerative joint disease) `   back and knees   Essential hypertension, benign    History of nuclear stress test    11/08 7 mets, no ischemia, EF 73%   Male hypogonadism    Osteomyelitis (HCC)    R tibia 1972, recur 2005   Other and unspecified hyperlipidemia    Past Surgical History:  Procedure Laterality Date   CORONARY ARTERY BYPASS GRAFT  2006   SPINE SURGERY  01/18/2023   (laminectomy surgery) L3,4,5   Family History  Problem Relation Age of Onset   Heart disease Mother        CABG   Lung cancer Mother    Alcoholism Father  Heart attack Neg Hx    Social History   Socioeconomic History   Marital status: Divorced    Spouse name: Not on file   Number of children: 3   Years of education: Not on file   Highest education level: Not on file  Occupational History   Occupation: Retired  Tobacco Use   Smoking status: Never   Smokeless tobacco: Current    Types: Snuff  Vaping Use   Vaping status: Never Used  Substance and Sexual Activity   Alcohol use: Yes    Alcohol/week: 8.0 standard  drinks of alcohol    Types: 1 Cans of beer, 7 Standard drinks or equivalent per week    Comment: daily in afternoons   Drug use: No   Sexual activity: Yes  Other Topics Concern   Not on file  Social History Narrative   Does not live alone, has a partner   Social Drivers of Corporate investment banker Strain: Low Risk  (07/04/2024)   Overall Financial Resource Strain (CARDIA)    Difficulty of Paying Living Expenses: Not hard at all  Food Insecurity: No Food Insecurity (07/04/2024)   Hunger Vital Sign    Worried About Running Out of Food in the Last Year: Never true    Ran Out of Food in the Last Year: Never true  Transportation Needs: No Transportation Needs (07/04/2024)   PRAPARE - Administrator, Civil Service (Medical): No    Lack of Transportation (Non-Medical): No  Physical Activity: Sufficiently Active (07/04/2024)   Exercise Vital Sign    Days of Exercise per Week: 7 days    Minutes of Exercise per Session: 30 min  Stress: No Stress Concern Present (07/04/2024)   [employer] of Occupational Health - Occupational Stress Questionnaire    Feeling of Stress: Not at all  Social Connections: Socially Isolated (07/04/2024)   Social Connection and Isolation Panel    Frequency of Communication with Friends and Family: Once a week    Frequency of Social Gatherings with Friends and Family: Once a week    Attends Religious Services: Never    Database administrator or Organizations: No    Attends Engineer, structural: Never    Marital Status: Living with partner    Tobacco Counseling Ready to quit: No Counseling given: Yes    Clinical Intake:  Pre-visit preparation completed: Yes  Pain : No/denies pain     BMI - recorded: 34.36 Nutritional Status: BMI > 30  Obese Nutritional Risks: None Diabetes: No  No results found for: HGBA1C   How often do you need to have someone help you when you read instructions, pamphlets, or other written materials  from your doctor or pharmacy?: 1 - Never  Interpreter Needed?: No  Information entered by :: Verdie Saba, CMA   Activities of Daily Living     07/04/2024   10:55 AM 07/03/2024   11:49 AM  In your present state of health, do you have any difficulty performing the following activities:  Hearing? 0 0  Vision? 0 0  Difficulty concentrating or making decisions? 0 0  Walking or climbing stairs? 0 0  Dressing or bathing? 0 0  Doing errands, shopping? 0 0  Preparing Food and eating ? N N  Using the Toilet? N N  In the past six months, have you accidently leaked urine? N N  Do you have problems with loss of bowel control? N N  Managing your Medications? N  N  Managing your Finances? N N  Housekeeping or managing your Housekeeping? N N    Patient Care Team: Purcell Emil Schanz, MD as PCP - General (Internal Medicine) Dann Candyce RAMAN, MD as PCP - Cardiology (Cardiology) Dr. [facility] (Optometry)  I have updated your Care Teams any recent Medical Services you may have received from other providers in the past year.     Assessment:   This is a routine wellness examination for [place].  Hearing/Vision screen Hearing Screening - Comments:: Denies hearing difficulties   Vision Screening - Comments:: Denies vision concerns   Goals Addressed               This Visit's Progress     Patient Stated (pt-stated)        Patient stated he plans to continue to stay alive        Depression Screen     07/04/2024   10:55 AM 04/11/2024   11:28 AM 07/06/2023   10:51 AM 07/02/2023    9:12 AM 03/03/2023   10:45 AM 12/30/2017   10:31 AM 04/14/2011    1:53 PM  PHQ 2/9 Scores  PHQ - 2 Score 0 0 0 0 0 0 0   PHQ- 9 Score 0   0        Data saved with a previous flowsheet row definition    Fall Risk     07/04/2024   10:55 AM 07/03/2024   11:49 AM 04/11/2024   11:29 AM 07/06/2023   10:51 AM 07/01/2023    7:41 PM  Fall Risk   Falls in the past year? 0 0 0 0 0  Number falls  in past yr: 0 0 0 0 0  Injury with Fall? 0 0 0 0 0  Risk for fall due to : No Fall Risks   No Fall Risks   Follow up Falls evaluation completed;Falls prevention discussed   Falls evaluation completed Falls evaluation completed;Falls prevention discussed    MEDICARE RISK AT HOME:  Medicare Risk at Home Any stairs in or around the home?: Yes If so, are there any without handrails?: No Home free of loose throw rugs in walkways, pet beds, electrical cords, etc?: Yes Adequate lighting in your home to reduce risk of falls?: Yes Life alert?: No Use of a cane, walker or w/c?: No Grab bars in the bathroom?: No Shower chair or bench in shower?: No Elevated toilet seat or a handicapped toilet?: No  TIMED UP AND GO:  Was the test performed?  No  Cognitive Function: 6CIT completed        07/04/2024   10:57 AM 07/02/2023    9:10 AM  6CIT Screen  What Year? 0 points   What month? 0 points   What time? 0 points 0 points  Count back from 20 0 points 0 points  Months in reverse 0 points 0 points  Repeat phrase 0 points 0 points  Total Score 0 points     Immunizations Immunization History  Administered Date(s) Administered   Influenza Whole 07/09/2008   Influenza-Unspecified 07/19/2013, 07/19/2022   Unspecified SARS-COV-2 Vaccination 07/19/2022   Zoster Recombinant(Shingrix) 10/16/2021    Screening Tests Health Maintenance  Topic Date Due   Hepatitis C Screening  Never done   Pneumococcal Vaccine: 50+ Years (1 of 2 - PCV) Never done   Zoster Vaccines- Shingrix (2 of 2) 12/11/2021   COVID-19 Vaccine (2 - Mixed Product risk series) 08/16/2022   Influenza Vaccine  05/19/2024  Medicare Annual Wellness (AWV)  07/04/2025   HPV VACCINES  Aged Out   Meningococcal B Vaccine  Aged Out   DTaP/Tdap/Td  Discontinued   Colonoscopy  Discontinued    Health Maintenance Items Addressed:  Labs Ordered: Hepatitis C Screening   Additional Screening:  Vision Screening: Recommended annual  ophthalmology exams for early detection of glaucoma and other disorders of the eye. Is the patient up to date with their annual eye exam?  No  Who is the provider or what is the name of the office in which the patient attends annual eye exams? Patient plans to schedule an appt w/an Optometrist in 2025.  Dental Screening: Recommended annual dental exams for proper oral hygiene  Community Resource Referral / Chronic Care Management: CRR required this visit?  No   CCM required this visit?  No   Plan:    I have personally reviewed and noted the following in the patient's chart:   Medical and social history Use of alcohol, tobacco or illicit drugs  Current medications and supplements including opioid prescriptions. Patient is not currently taking opioid prescriptions. Functional ability and status Nutritional status Physical activity Advanced directives List of other physicians Hospitalizations, surgeries, and ER visits in previous 12 months Vitals Screenings to include cognitive, depression, and falls Referrals and appointments  In addition, I have reviewed and discussed with patient certain preventive protocols, quality metrics, and best practice recommendations. A written personalized care plan for preventive services as well as general preventive health recommendations were provided to patient.   Verdie CHRISTELLA Saba, CMA   07/04/2024   After Visit Summary: (MyChart) Due to this being a telephonic visit, the after visit summary with patients personalized plan was offered to patient via MyChart   Notes: Nothing significant to report at this time.

## 2024-07-04 NOTE — Patient Instructions (Addendum)
 Mr. Signorelli,  Thank you for taking the time for your Medicare Wellness Visit. I appreciate your continued commitment to your health goals. Please review the care plan we discussed, and feel free to reach out if I can assist you further.  Medicare recommends these wellness visits once per year to help you and your care team stay ahead of potential health issues. These visits are designed to focus on prevention, allowing your provider to concentrate on managing your acute and chronic conditions during your regular appointments.  Please note that Annual Wellness Visits do not include a physical exam. Some assessments may be limited, especially if the visit was conducted virtually. If needed, we may recommend a separate in-person follow-up with your provider.  Ongoing Care Seeing your primary care provider every 3 to 6 months helps us  monitor your health and provide consistent, personalized care.   Referrals If a referral was made during today's visit and you haven't received any updates within two weeks, please contact the referred provider directly to check on the status.  Recommended Screenings:  Health Maintenance  Topic Date Due   Hepatitis C Screening  Never done   Pneumococcal Vaccine for age over 19 (1 of 2 - PCV) Never done   Zoster (Shingles) Vaccine (2 of 2) 12/11/2021   COVID-19 Vaccine (2 - Mixed Product risk series) 08/16/2022   Flu Shot  05/19/2024   Medicare Annual Wellness Visit  07/04/2025   HPV Vaccine  Aged Out   Meningitis B Vaccine  Aged Out   DTaP/Tdap/Td vaccine  Discontinued   Colon Cancer Screening  Discontinued       07/04/2024   10:52 AM  Advanced Directives  Does Patient Have a Medical Advance Directive? Yes  Type of Estate agent of [place];Living will  Copy of Healthcare Power of Attorney in Chart? No - copy requested   Advance Care Planning is important because it: Ensures you receive medical care that aligns with your values,  goals, and preferences. Provides guidance to your family and loved ones, reducing the emotional burden of decision-making during critical moments.  Vision: Annual vision screenings are recommended for early detection of glaucoma, cataracts, and diabetic retinopathy. These exams can also reveal signs of chronic conditions such as diabetes and high blood pressure.  Dental: Annual dental screenings help detect early signs of oral cancer, gum disease, and other conditions linked to overall health, including heart disease and diabetes.

## 2024-07-18 ENCOUNTER — Encounter: Payer: Self-pay | Admitting: Emergency Medicine

## 2024-07-18 ENCOUNTER — Ambulatory Visit (INDEPENDENT_AMBULATORY_CARE_PROVIDER_SITE_OTHER): Admitting: Emergency Medicine

## 2024-07-18 VITALS — BP 140/78 | HR 52 | Temp 98.4°F | Ht 68.0 in | Wt 224.0 lb

## 2024-07-18 DIAGNOSIS — I2510 Atherosclerotic heart disease of native coronary artery without angina pectoris: Secondary | ICD-10-CM | POA: Diagnosis not present

## 2024-07-18 DIAGNOSIS — I10 Essential (primary) hypertension: Secondary | ICD-10-CM

## 2024-07-18 DIAGNOSIS — R1319 Other dysphagia: Secondary | ICD-10-CM

## 2024-07-18 NOTE — Assessment & Plan Note (Signed)
 BP Readings from Last 3 Encounters:  07/18/24 (!) 140/78  04/11/24 132/64  07/06/23 114/78  Elevated blood pressure reading in the office but normal readings at home Continue amlodipine  10 mg daily and lisinopril  20 mg daily Cardiovascular risks associated with hypertension discussed Diet and nutrition discussed Benefits of exercise discussed

## 2024-07-18 NOTE — Progress Notes (Signed)
 Craig Knapp 78 y.o.   Chief Complaint  Patient presents with   Referral    Patient here for some questions. He states after his first bite of food he gets a burn in his chest but goes away, patient also mentions he can't burp after his meals so sometimes he has to go to the bathroom and cough up this foam like substance, no food comes up. Says after he coughs this up he feels better and can finish eating. Has been going on for 2-3 months     HISTORY OF PRESENT ILLNESS: This is a 78 y.o. male A1A complaining of upper GI symptoms for the last 3 to 4 months Intermittent symptoms Sometimes he gets mid chest burning after first bite of food and then goes away Sometimes he feels like burping but gas will not come up.  He forces it up by coughing and then notices foam like glob.  HPI    Prior to Admission medications   Medication Sig Start Date End Date Taking? Authorizing Provider  amLODipine  (NORVASC ) 10 MG tablet Take 1 tablet (10 mg total) by mouth daily. 04/11/24  Yes SagardiaEmil Schanz, MD  aspirin 81 MG tablet Take 81 mg by mouth daily.   Yes [provider]  atorvastatin  (LIPITOR) 80 MG tablet Take 1 tablet (80 mg total) by mouth daily. 04/11/24 07/18/24 Yes Hetvi Shawhan Jose, MD  fluticasone (FLONASE) 50 MCG/ACT nasal spray Place into both nostrils as needed for allergies or rhinitis.   Yes [provider]  lisinopril  (ZESTRIL ) 20 MG tablet Take 1 tablet (20 mg total) by mouth 2 (two) times daily. 04/11/24  Yes Callista Hoh, Emil Schanz, MD  naproxen sodium (ANAPROX) 220 MG tablet Take 220 mg by mouth as needed (PAIN).    Yes [provider]  Omega-3 Fatty Acids (FISH OIL) 1000 MG CAPS Take 2 capsules by mouth daily.   Yes [provider]    Allergies  Allergen Reactions   Codeine Nausea Only    Crazy feeling in my head    Patient Active Problem List   Diagnosis Date Noted   Balance problem 07/06/2023   Obesity, unspecified 07/03/2014    Hypogonadism male 03/27/2011   Dyslipidemia 08/06/2008   Essential hypertension 08/06/2008   Coronary atherosclerosis 08/06/2008   Chronic osteomyelitis of lower leg (HCC) 08/06/2008   CORONARY ARTERY BYPASS GRAFT, HX OF 08/06/2008   HLD (hyperlipidemia) 08/06/2008    Past Medical History:  Diagnosis Date   Coronary atherosclerosis of native coronary artery    CABG 2006, EDMUNDS   DJD (degenerative joint disease) `   back and knees   Essential hypertension, benign    History of nuclear stress test    11/08 7 mets, no ischemia, EF 73%   Male hypogonadism    Osteomyelitis (HCC)    R tibia 1972, recur 2005   Other and unspecified hyperlipidemia     Past Surgical History:  Procedure Laterality Date   CORONARY ARTERY BYPASS GRAFT  2006   SPINE SURGERY  01/18/2023   (laminectomy surgery) L3,4,5    Social History   Socioeconomic History   Marital status: Divorced    Spouse name: Not on file   Number of children: 3   Years of education: Not on file   Highest education level: Not on file  Occupational History   Occupation: Retired  Tobacco Use   Smoking status: Never   Smokeless tobacco: Current    Types: Snuff  Vaping Use  Vaping status: Never Used  Substance and Sexual Activity   Alcohol use: Yes    Alcohol/week: 8.0 standard drinks of alcohol    Types: 1 Cans of beer, 7 Standard drinks or equivalent per week    Comment: daily in afternoons   Drug use: No   Sexual activity: Yes  Other Topics Concern   Not on file  Social History Narrative   Does not live alone, has a partner   Social Drivers of Corporate investment banker Strain: Low Risk  (07/04/2024)   Overall Financial Resource Strain (CARDIA)    Difficulty of Paying Living Expenses: Not hard at all  Food Insecurity: No Food Insecurity (07/04/2024)   Hunger Vital Sign    Worried About Running Out of Food in the Last Year: Never true    Ran Out of Food in the Last Year: Never true  Transportation  Needs: No Transportation Needs (07/04/2024)   PRAPARE - Administrator, Civil Service (Medical): No    Lack of Transportation (Non-Medical): No  Physical Activity: Sufficiently Active (07/04/2024)   Exercise Vital Sign    Days of Exercise per Week: 7 days    Minutes of Exercise per Session: 30 min  Stress: No Stress Concern Present (07/04/2024)   [employer] of Occupational Health - Occupational Stress Questionnaire    Feeling of Stress: Not at all  Social Connections: Socially Isolated (07/04/2024)   Social Connection and Isolation Panel    Frequency of Communication with Friends and Family: Once a week    Frequency of Social Gatherings with Friends and Family: Once a week    Attends Religious Services: Never    Database administrator or Organizations: No    Attends Banker Meetings: Never    Marital Status: Living with partner  Intimate Partner Violence: Not At Risk (07/04/2024)   Humiliation, Afraid, Rape, and Kick questionnaire    Fear of Current or Ex-Partner: No    Emotionally Abused: No    Physically Abused: No    Sexually Abused: No    Family History  Problem Relation Age of Onset   Heart disease Mother        CABG   Lung cancer Mother    Alcoholism Father    Heart attack Neg Hx      Review of Systems  Constitutional: Negative.  Negative for chills and fever.  HENT:  Negative for congestion and sore throat.   Respiratory: Negative.  Negative for cough and shortness of breath.   Cardiovascular: Negative.  Negative for palpitations.  Gastrointestinal:  Negative for abdominal pain, diarrhea, nausea and vomiting.  Genitourinary: Negative.  Negative for dysuria and hematuria.  Skin: Negative.   Neurological: Negative.  Negative for dizziness and headaches.    Vitals:   07/18/24 1006  BP: (!) 140/78  Pulse: (!) 52  Temp: 98.4 F (36.9 C)  SpO2: 96%    Physical Exam Vitals reviewed.  Constitutional:      Appearance: Normal  appearance.  HENT:     Head: Normocephalic.  Eyes:     Extraocular Movements: Extraocular movements intact.  Cardiovascular:     Rate and Rhythm: Normal rate and regular rhythm.     Pulses: Normal pulses.     Heart sounds: Normal heart sounds.  Pulmonary:     Effort: Pulmonary effort is normal.     Breath sounds: Normal breath sounds.  Abdominal:     Palpations: Abdomen is soft.  Tenderness: There is no abdominal tenderness.  Musculoskeletal:     Cervical back: No tenderness.  Lymphadenopathy:     Cervical: No cervical adenopathy.  Skin:    General: Skin is warm and dry.     Capillary Refill: Capillary refill takes less than 2 seconds.  Neurological:     General: No focal deficit present.     Mental Status: He is alert and oriented to person, place, and time.  Psychiatric:        Mood and Affect: Mood normal.        Behavior: Behavior normal.      ASSESSMENT & PLAN: Problem List Items Addressed This Visit       Cardiovascular and Mediastinum   Essential hypertension   BP Readings from Last 3 Encounters:  07/18/24 (!) 140/78  04/11/24 132/64  07/06/23 114/78  Elevated blood pressure reading in the office but normal readings at home Continue amlodipine  10 mg daily and lisinopril  20 mg daily Cardiovascular risks associated with hypertension discussed Diet and nutrition discussed Benefits of exercise discussed      Coronary atherosclerosis   No recent anginal episodes. Stable chronic condition. Continues daily baby aspirin Coronary bypass surgery 2006        Digestive   Esophageal dysphagia - Primary   Clinically stable but symptoms affecting quality of life Needs upper endoscopy Referral placed today      Relevant Orders   Ambulatory referral to Gastroenterology   Patient Instructions  Problems With Swallowing (Dysphagia): What to Know  Dysphagia means having trouble swallowing. It happens when food and drinks stick in your throat on the way down  to your stomach, or when food takes longer to get to your stomach than usual. You may have problems swallowing food, liquids, or both. You may also have pain while trying to swallow. It may take you more time and effort to swallow something. What are the causes? Dysphagia may be caused by: Muscle problems. These may make it hard for you to move food and liquids through your esophagus, which is the tube that connects your mouth to your stomach. Blockages. You may have ulcers, scar tissue, or inflammation that blocks the normal passage of food and liquids. Causes of these problems include: Acid reflux from your stomach into your esophagus. Infections. Radiation treatment for cancer. Taking medicines without enough fluids to help them go down into your stomach. Stroke. This can affect the nerves and make it hard to swallow. Nerve problems. These prevent signals from being sent to the muscles of your esophagus. These signals tell the muscles to squeeze (contract) and move what you swallow down to your stomach. Globus pharyngeus. This is a common problem that can make you feel like something is stuck in your throat or like swallowing is difficult, even though nothing is wrong with the swallowing passages. Certain conditions, such as cerebral palsy or Parkinson's disease. What are the signs or symptoms? Common symptoms include: A feeling that solids or liquids are stuck in your throat on the way down to the stomach. Pain while swallowing. Coughing or gagging while trying to swallow. Other symptoms include: Food moving back from your stomach to your mouth. This is called regurgitation. Noises coming from your throat. Chest discomfort when swallowing. A feeling of fullness when swallowing. Drooling, especially when the throat is blocked. Heartburn. How is this diagnosed? Dysphagia may be diagnosed by doing tests. These may include: Video fluoroscopic swallow study, also called a barium swallow.  In this  test, you'll swallow a white liquid that sticks to the inside of your esophagus. X-ray images are then taken. Endoscopy. In this test, a flexible scope is put down your throat to look at your esophagus and your stomach. CT scans or an MRI. How is this treated? Treatment for dysphagia depends on the cause: If it's caused by acid reflux or infection, medicines may be used. These may include antibiotics or heartburn medicines. If it's caused by problems with the muscles, you may need swallowing therapy. This can help you strengthen your swallowing muscles. You may have to do specific exercises to strengthen the muscles or stretch them. If it's caused by a blockage or mass, procedures to remove the blockage may be done. You may need surgery and a feeding tube. You may also need to make diet changes. Talk with your health care provider about specific changes. Follow these instructions at home: Medicines Take your medicines only as told. If you were given antibiotics, take them as told. Do not stop taking them even if you start to feel better. Eating and drinking  Make any diet changes as told by your provider. Work with an Financial controller in healthy eating called a dietitian. They can help you create an eating plan to make sure you get the nutrients you need. Eat soft foods that are easier to swallow. Cut your food into small pieces and eat slowly. Take small bites. Eat and drink only when you're sitting upright. Do not drink alcohol or caffeine. If you need help quitting, ask your provider. General instructions Check your weight every day to make sure you're not losing weight. Do not smoke, vape, or use nicotine or tobacco. Contact a health care provider if: You lose weight because you can't swallow. You cough when you drink liquids. You cough up partially digested food. Get help right away if: You can't swallow your saliva. You have shortness of breath, a fever, or both. Your voice is  hoarse and you have trouble swallowing. These symptoms may be an emergency. Call 911 right away. Do not wait to see if the symptoms will go away. Do not drive yourself to the hospital. This information is not intended to replace advice given to you by your health care provider. Make sure you discuss any questions you have with your health care provider. Document Revised: 09/02/2023 Document Reviewed: 09/02/2023 Elsevier Patient Education  2025 Else[employer]     Emil Schaumann, MD [facility]

## 2024-07-18 NOTE — Assessment & Plan Note (Signed)
 Clinically stable but symptoms affecting quality of life Needs upper endoscopy Referral placed today

## 2024-07-18 NOTE — Assessment & Plan Note (Signed)
 No recent anginal episodes. Stable chronic condition. Continues daily baby aspirin Coronary bypass surgery 2006

## 2024-07-18 NOTE — Patient Instructions (Signed)
 Problems With Swallowing (Dysphagia): What to Know  Dysphagia means having trouble swallowing. It happens when food and drinks stick in your throat on the way down to your stomach, or when food takes longer to get to your stomach than usual. You may have problems swallowing food, liquids, or both. You may also have pain while trying to swallow. It may take you more time and effort to swallow something. What are the causes? Dysphagia may be caused by: Muscle problems. These may make it hard for you to move food and liquids through your esophagus, which is the tube that connects your mouth to your stomach. Blockages. You may have ulcers, scar tissue, or inflammation that blocks the normal passage of food and liquids. Causes of these problems include: Acid reflux from your stomach into your esophagus. Infections. Radiation treatment for cancer. Taking medicines without enough fluids to help them go down into your stomach. Stroke. This can affect the nerves and make it hard to swallow. Nerve problems. These prevent signals from being sent to the muscles of your esophagus. These signals tell the muscles to squeeze (contract) and move what you swallow down to your stomach. Globus pharyngeus. This is a common problem that can make you feel like something is stuck in your throat or like swallowing is difficult, even though nothing is wrong with the swallowing passages. Certain conditions, such as cerebral palsy or Parkinson's disease. What are the signs or symptoms? Common symptoms include: A feeling that solids or liquids are stuck in your throat on the way down to the stomach. Pain while swallowing. Coughing or gagging while trying to swallow. Other symptoms include: Food moving back from your stomach to your mouth. This is called regurgitation. Noises coming from your throat. Chest discomfort when swallowing. A feeling of fullness when swallowing. Drooling, especially when the throat is  blocked. Heartburn. How is this diagnosed? Dysphagia may be diagnosed by doing tests. These may include: Video fluoroscopic swallow study, also called a barium swallow. In this test, you'll swallow a white liquid that sticks to the inside of your esophagus. X-ray images are then taken. Endoscopy. In this test, a flexible scope is put down your throat to look at your esophagus and your stomach. CT scans or an MRI. How is this treated? Treatment for dysphagia depends on the cause: If it's caused by acid reflux or infection, medicines may be used. These may include antibiotics or heartburn medicines. If it's caused by problems with the muscles, you may need swallowing therapy. This can help you strengthen your swallowing muscles. You may have to do specific exercises to strengthen the muscles or stretch them. If it's caused by a blockage or mass, procedures to remove the blockage may be done. You may need surgery and a feeding tube. You may also need to make diet changes. Talk with your health care provider about specific changes. Follow these instructions at home: Medicines Take your medicines only as told. If you were given antibiotics, take them as told. Do not stop taking them even if you start to feel better. Eating and drinking  Make any diet changes as told by your provider. Work with an Financial controller in healthy eating called a dietitian. They can help you create an eating plan to make sure you get the nutrients you need. Eat soft foods that are easier to swallow. Cut your food into small pieces and eat slowly. Take small bites. Eat and drink only when you're sitting upright. Do not drink alcohol or  caffeine . If you need help quitting, ask your provider. General instructions Check your weight every day to make sure you're not losing weight. Do not smoke, vape, or use nicotine  or tobacco. Contact a health care provider if: You lose weight because you can't swallow. You cough when you drink  liquids. You cough up partially digested food. Get help right away if: You can't swallow your saliva. You have shortness of breath, a fever, or both. Your voice is hoarse and you have trouble swallowing. These symptoms may be an emergency. Call 911 right away. Do not wait to see if the symptoms will go away. Do not drive yourself to the hospital. This information is not intended to replace advice given to you by your health care provider. Make sure you discuss any questions you have with your health care provider. Document Revised: 09/02/2023 Document Reviewed: 09/02/2023 Elsevier Patient Education  2025 [employer].

## 2024-07-24 ENCOUNTER — Encounter: Payer: Self-pay | Admitting: Gastroenterology

## 2024-07-25 NOTE — Progress Notes (Unsigned)
 Cardiology Office Note:    Date:  07/26/2024   ID:  Craig Knapp, DOB 07-Sep-1946, MRN [id number]  PCP:  Purcell Emil Schanz, MD   [facility] Providers Cardiologist:  Georganna Archer, MD     Referring MD: Purcell Emil Schanz, *   Chief complaint: Annual follow up of CAD   History of Present Illness:    Craig Knapp is a 78 y.o. male with a hx of CAD s/p CABG x6 (LIMA-LAD, SVG-D2, seqSVG-D1/OM1/OM 2, SVG-dRCA) in 2006, hypertension, hyperlipidemia, aortic atherosclerosis, chronic dyspnea on exertion, RBBB+LAFB, DJD, former tobacco abuse, infrarenal aortic ectasia, obesity here for annual follow-up.   Nuc 2020 showed EF 61%, small defect of moderate severity present in the mid inferior and apical inferior location. This is likely due to diaphragmatic attenuation. I cannot exclude a prior non-transmural inferior wall infarction . His LV function is normal and the inferior wall contracts normally. This is a low risk study. AAA duplex 08/2022 showed stable results, no evidence of an abdominal aortic aneurysm was visualized. The largest aortic measurement is 2.6 cm. The infrarenal distal abdominal aorta is ectatic, measuring 2.6 cm and is consistent with MRI results on 08/28/2021. Metoprolol  was stopped due to bradycardia at last OV in 02/2022. 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   Presents alone to the clinic today, doing well from a cardiovascular standpoint. He denies chest pain, palpitations, dyspnea, orthopnea, n, v,  dark/tarry/bloody stools, hematuria, dizziness, syncope, edema, weight gain. Checks BPs regularly, averages 125-130 systolic regularly. Describes some deconditioning following retirement, but states it does not interfere w/ his ADLs. Lives in a duplex and regularly climbs the stairs w/ no issue. Reports mild RLE that is stable, unchanged, secondary to osteomyelitis in right shin that patient has been routinely caring for at home since 1972. Denies  any cardiac complaints. He reports he may be having endoscopy in the near future for some issues with dysphagia.  Past Medical History:  Diagnosis Date   Coronary atherosclerosis of native coronary artery    CABG 2006, EDMUNDS   DJD (degenerative joint disease) `   back and knees   Essential hypertension, benign    History of nuclear stress test    11/08 7 mets, no ischemia, EF 73%   Male hypogonadism    Osteomyelitis (HCC)    R tibia 1972, recur 2005   Other and unspecified hyperlipidemia     Past Surgical History:  Procedure Laterality Date   CORONARY ARTERY BYPASS GRAFT  2006   SPINE SURGERY  01/18/2023   (laminectomy surgery) L3,4,5    Current Medications: Current Meds  Medication Sig   amLODipine  (NORVASC ) 10 MG tablet Take 1 tablet (10 mg total) by mouth daily.   aspirin 81 MG tablet Take 81 mg by mouth daily.   atorvastatin  (LIPITOR) 80 MG tablet Take 1 tablet (80 mg total) by mouth daily.   fluticasone (FLONASE) 50 MCG/ACT nasal spray Place into both nostrils as needed for allergies or rhinitis.   lisinopril  (ZESTRIL ) 20 MG tablet Take 1 tablet (20 mg total) by mouth 2 (two) times daily.   naproxen sodium (ANAPROX) 220 MG tablet Take 220 mg by mouth as needed (PAIN).    Omega-3 Fatty Acids (FISH OIL) 1000 MG CAPS Take 2 capsules by mouth daily.     Allergies:   Codeine   Social History   Socioeconomic History   Marital status: Divorced    Spouse name: Not on file   Number of children: 3  Years of education: Not on file   Highest education level: Not on file  Occupational History   Occupation: Retired  Tobacco Use   Smoking status: Former    Current packs/day: 0.00    Types: Cigarettes    Quit date: 2006    Years since quitting: 19.7   Smokeless tobacco: Current    Types: Snuff  Vaping Use   Vaping status: Never Used  Substance and Sexual Activity   Alcohol use: Yes    Alcohol/week: 8.0 standard drinks of alcohol    Types: 1 Cans of beer, 7  Standard drinks or equivalent per week    Comment: daily in afternoons   Drug use: No   Sexual activity: Yes  Other Topics Concern   Not on file  Social History Narrative   Does not live alone, has a partner   Social Drivers of Corporate investment banker Strain: Low Risk  (07/04/2024)   Overall Financial Resource Strain (CARDIA)    Difficulty of Paying Living Expenses: Not hard at all  Food Insecurity: No Food Insecurity (07/04/2024)   Hunger Vital Sign    Worried About Running Out of Food in the Last Year: Never true    Ran Out of Food in the Last Year: Never true  Transportation Needs: No Transportation Needs (07/04/2024)   PRAPARE - Administrator, Civil Service (Medical): No    Lack of Transportation (Non-Medical): No  Physical Activity: Sufficiently Active (07/04/2024)   Exercise Vital Sign    Days of Exercise per Week: 7 days    Minutes of Exercise per Session: 30 min  Stress: No Stress Concern Present (07/04/2024)   [employer] of Occupational Health - Occupational Stress Questionnaire    Feeling of Stress: Not at all  Social Connections: Socially Isolated (07/04/2024)   Social Connection and Isolation Panel    Frequency of Communication with Friends and Family: Once a week    Frequency of Social Gatherings with Friends and Family: Once a week    Attends Religious Services: Never    Database administrator or Organizations: No    Attends Engineer, structural: Never    Marital Status: Living with partner     Family History: The patient's family history includes Alcoholism in his father; Heart disease in his mother; Lung cancer in his mother. There is no history of Heart attack.  ROS:   Please see the history of present illness.    All other systems reviewed and are negative.  EKGs/Labs/Other Studies Reviewed:    The following studies were reviewed today:  EKG Interpretation Date/Time:  Wednesday July 26 2024 16:20:08 EDT Ventricular  Rate:  53 PR Interval:  180 QRS Duration:  134 QT Interval:  454 QTC Calculation: 426 R Axis:   -52  Text Interpretation: Sinus bradycardia Left axis deviation Right bundle branch block When compared with ECG of 06-Apr-2023 15:36, No significant change was found Confirmed by Kalim Kissel [phone]) on 07/26/2024 4:34:03 PM    Recent Labs: 04/11/2024: ALT 25; BUN 13; Creatinine, Ser 0.88; Potassium 4.4; Sodium 134  Recent Lipid Panel    Component Value Date/Time   CHOL 125 04/11/2024 1131   CHOL 141 09/11/2019 0759   TRIG 100.0 04/11/2024 1131   HDL 68.70 04/11/2024 1131   HDL 84 09/11/2019 0759   CHOLHDL 2 04/11/2024 1131   VLDL 20.0 04/11/2024 1131   LDLCALC 36 04/11/2024 1131   LDLCALC 41 09/11/2019 0759    Physical  Exam:    VS:  BP (!) 145/66   Pulse (!) 53   Ht 5' 8 (1.727 m)   Wt 227 lb (103 kg)   SpO2 95%   BMI 34.52 kg/m     Wt Readings from Last 3 Encounters:  07/26/24 227 lb (103 kg)  07/18/24 224 lb (101.6 kg)  07/04/24 226 lb (102.5 kg)     GEN:  Well nourished, well developed in no acute distress HEENT: Normal NECK:  No carotid bruit CARDIAC: RRR, no murmurs, rubs, gallops RESPIRATORY:  Clear to auscultation without rales, wheezing or rhonchi  ABDOMEN: Soft, non-tender, non-distended MUSCULOSKELETAL:  No edema; No deformity  SKIN: Warm and dry NEUROLOGIC:  Alert and oriented x 3 PSYCHIATRIC:  Normal affect   ASSESSMENT:    1. Coronary artery disease involving native coronary artery of native heart, unspecified whether angina present   2. S/P CABG x 6   3. Aortic ectasia, abdominal   4. Primary hypertension   5. Hyperlipidemia, unspecified hyperlipidemia type   6. RBBB   7. Preop cardiovascular exam    PLAN:    In order of problems listed above:  CAD s/p CABG x 6  EKG: Sinus bradycardia, 53 bpm, RBBB, left axis deviation Denies CP, SOB, palpitations, dizziness, LE edema Continue aspirin 81 mg daily Continue Lipitor 80 mg daily Continue  lisinopril  20 mg daily Continue amlodipine  10 daily He did not have a CBC this year; he prefers to discuss checking with his PCP. Denies bleeding on ASA  HLD, LDL goal <55 Lipid panel 04/11/2024: Cholesterol 125, HDL 68, triglycerides 100, LDL 36 Managed by PCP Continue atorvastatin  80 mg daily Continue omega-3 fatty acids daily  Infrarenal aortic ectasia AAA duplex 09/17/2022 largest aortic measurement 2.6 cm, consistent with MRI results on 08/2021, previously not felt to require additional imaging per MD ACC/AHA guidelines indicate abdominal aortic dilatation that does not meet criteria for aneurysm, typically <3.0, do not require routine surveillance imaging  If patient has unrelated abdominal imaging in the future, could give attention to reevaluating this area at that time  HTN Patient attributes elevated reading in 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 We have not received any official clearance request, but wanted to make commentary. He is able to exert himself over 4 METS without angina or dyspnea, no accelerating symptoms. From cardiac standpoint low risk to proceed. Would not anticipate he would need to hold aspirin for this procedure, would continue peri-procedurally if possible. Patient aware to notify if he develops any interim cardiac concerns.   Follow up in 1 year with Dr. Floretta to establish care     Medication Adjustments/Labs and Tests Ordered: Current medicines are reviewed at length with the patient today.  Concerns regarding  medicines are outlined above.  Orders Placed This Encounter  Procedures   EKG 12-Lead   No orders of the defined types were placed in this encounter.   Patient Instructions  Medication Instructions:   No changes  *If you need a refill on your cardiac medications before your next appointment, please call your pharmacy*  Lab Work: NOT NEEDED    Testing/Procedures: Not needed   Follow-Up: At [facility], you and your health needs are our priority.  As part of our continuing mission to provide you with exceptional heart care, we have created designated Provider Care Teams.  These Care Teams include your primary Cardiologist (physician) and Advanced Practice Providers (APPs -  Physician Assistants and Nurse Practitioners) who all work together to provide you with the care you need, when you need it.     Your next appointment:   12 month(s)  The format for your next appointment:   In Person  Provider:   Georganna Archer, MD      Signed, Miriam FORBES Shams, NP  07/26/2024 5:22 PM    [facility]

## 2024-07-26 ENCOUNTER — Ambulatory Visit: Attending: Cardiology | Admitting: Emergency Medicine

## 2024-07-26 ENCOUNTER — Encounter: Payer: Self-pay | Admitting: Physician Assistant

## 2024-07-26 VITALS — BP 145/66 | HR 53 | Ht 68.0 in | Wt 227.0 lb

## 2024-07-26 DIAGNOSIS — Z951 Presence of aortocoronary bypass graft: Secondary | ICD-10-CM | POA: Diagnosis present

## 2024-07-26 DIAGNOSIS — Z01810 Encounter for preprocedural cardiovascular examination: Secondary | ICD-10-CM | POA: Diagnosis present

## 2024-07-26 DIAGNOSIS — E785 Hyperlipidemia, unspecified: Secondary | ICD-10-CM | POA: Diagnosis present

## 2024-07-26 DIAGNOSIS — I77811 Abdominal aortic ectasia: Secondary | ICD-10-CM | POA: Insufficient documentation

## 2024-07-26 DIAGNOSIS — I4510 Unspecified right bundle-branch block: Secondary | ICD-10-CM | POA: Diagnosis present

## 2024-07-26 DIAGNOSIS — I2510 Atherosclerotic heart disease of native coronary artery without angina pectoris: Secondary | ICD-10-CM | POA: Insufficient documentation

## 2024-07-26 DIAGNOSIS — I10 Essential (primary) hypertension: Secondary | ICD-10-CM | POA: Diagnosis present

## 2024-07-26 NOTE — Patient Instructions (Signed)
 Medication Instructions:   No changes  *If you need a refill on your cardiac medications before your next appointment, please call your pharmacy*   Lab Work: NOT NEEDED    Testing/Procedures: Not needed   Follow-Up: At [facility], you and your health needs are our priority.  As part of our continuing mission to provide you with exceptional heart care, we have created designated Provider Care Teams.  These Care Teams include your primary Cardiologist (physician) and Advanced Practice Providers (APPs -  Physician Assistants and Nurse Practitioners) who all work together to provide you with the care you need, when you need it.     Your next appointment:   12 month(s)  The format for your next appointment:   In Person  Provider:   Georganna Archer, MD

## 2024-08-01 ENCOUNTER — Ambulatory Visit: Admitting: Physician Assistant

## 2024-09-11 ENCOUNTER — Ambulatory Visit: Payer: Self-pay | Admitting: Gastroenterology

## 2024-09-11 ENCOUNTER — Encounter: Payer: Self-pay | Admitting: Gastroenterology

## 2024-09-11 ENCOUNTER — Ambulatory Visit: Admitting: Gastroenterology

## 2024-09-11 ENCOUNTER — Other Ambulatory Visit

## 2024-09-11 VITALS — BP 130/60 | HR 78 | Ht 68.0 in | Wt 230.0 lb

## 2024-09-11 DIAGNOSIS — R1310 Dysphagia, unspecified: Secondary | ICD-10-CM

## 2024-09-11 DIAGNOSIS — R1319 Other dysphagia: Secondary | ICD-10-CM

## 2024-09-11 DIAGNOSIS — K219 Gastro-esophageal reflux disease without esophagitis: Secondary | ICD-10-CM

## 2024-09-11 LAB — CBC WITH DIFFERENTIAL/PLATELET
Basophils Absolute: 0.1 K/uL (ref 0.0–0.1)
Basophils Relative: 1.2 % (ref 0.0–3.0)
Eosinophils Absolute: 0.2 K/uL (ref 0.0–0.7)
Eosinophils Relative: 2.7 % (ref 0.0–5.0)
HCT: 43.7 % (ref 39.0–52.0)
Hemoglobin: 14.7 g/dL (ref 13.0–17.0)
Lymphocytes Relative: 17.9 % (ref 12.0–46.0)
Lymphs Abs: 1.1 K/uL (ref 0.7–4.0)
MCHC: 33.6 g/dL (ref 30.0–36.0)
MCV: 96.9 fl (ref 78.0–100.0)
Monocytes Absolute: 0.6 K/uL (ref 0.1–1.0)
Monocytes Relative: 9.4 % (ref 3.0–12.0)
Neutro Abs: 4.2 K/uL (ref 1.4–7.7)
Neutrophils Relative %: 68.8 % (ref 43.0–77.0)
Platelets: 236 K/uL (ref 150.0–400.0)
RBC: 4.51 Mil/uL (ref 4.22–5.81)
RDW: 13.2 % (ref 11.5–15.5)
WBC: 6.2 K/uL (ref 4.0–10.5)

## 2024-09-11 NOTE — Patient Instructions (Addendum)
 Your provider has requested that you go to the basement level for lab work before leaving today. Press B on the elevator. The lab is located at the first door on the left as you exit the elevator.  Please purchase the following medications over the counter and take as directed: Omeprazole 20 mg daily 30-60 minutes before breakfast.  You have been scheduled for an endoscopy. Please follow written instructions given to you at your visit today.  If you use inhalers (even only as needed), please bring them with you on the day of your procedure.  If you take any of the following medications, they will need to be adjusted prior to your procedure:   DO NOT TAKE 7 DAYS PRIOR TO TEST- Trulicity (dulaglutide) Ozempic, Wegovy (semaglutide) Mounjaro, Zepbound (tirzepatide) Bydureon Bcise (exanatide extended release)  DO NOT TAKE 1 DAY PRIOR TO YOUR TEST Rybelsus (semaglutide) Adlyxin (lixisenatide) Victoza (liraglutide) Byetta (exanatide) ___________________________________________________________________________

## 2024-09-11 NOTE — Progress Notes (Signed)
 Craig Knapp [id number] 19-Dec-1945   Chief Complaint: Trouble swallowing  Referring Provider: Purcell Emil Schanz, * Primary GI MD: Unassigned  HPI: Craig Knapp is a 78 y.o. male with past medical history of CAD s/p CABG, HTN, HLD, former tobacco abuse, obesity who presents today for a complaint of trouble swallowing.    Referred by PCP for 3 to 4 months of intermittent upper GI symptoms including dysphagia.  Last seen by cardiology 07/26/2024. Per note: Nuc 2020 showed EF 61%, small defect of moderate severity present in the mid inferior and apical inferior location. This is likely due to diaphragmatic attenuation. I cannot exclude a prior non-transmural inferior wall infarction . His LV function is normal and the inferior wall contracts normally. This is a low risk study. AAA duplex 08/2022 showed stable results, no evidence of an abdominal aortic aneurysm was visualized. The largest aortic measurement is 2.6 cm. The infrarenal distal abdominal aorta is ectatic, measuring 2.6 cm and is consistent with MRI results on 08/28/2021.   Discussed the use of AI scribe software for clinical note transcription with the patient, who gave verbal consent to proceed.  History of Present Illness Craig Knapp is a 78 year old male who presents with difficulty swallowing and occasional heartburn.   Dysphagia and oropharyngeal sensations - Difficulty swallowing for the past 3-4 months, beginning around the start of summer - Burning sensation with the first bite of a meal, starting in the throat and moving downwards - No discomfort with subsequent bites or drinks - Symptoms are intermittent and not consistently present - No sensation of food getting stuck while swallowing  Regurgitation and coughing episodes - Episodes of inability to burp, leading to coughing and expulsion of a 'heavy foam' - No expulsion of undigested food during these episodes - Episodes have been sporadic and  have not occurred in the past three weeks  Heartburn and gastroesophageal reflux symptoms - Occasional heartburn, typically at night after consuming spicy foods or certain drinks - Heartburn sometimes causes nocturnal awakening - Symptoms relieved by Pepto Bismol or antacids  Associated and constitutional symptoms - No unintentional weight loss - No changes in appetite - No nausea or vomiting - No changes in bowel movements, including no dark stools or diarrhea  Cardiopulmonary symptoms - No chest pain - No shortness of breath  No family history of esophageal, stomach, or colon cancer.  Reports he was advised not to have a repeat colonoscopy due to age.  Previous GI Procedures/Imaging   Colonoscopy 04/13/2022 Preparation of the colon was fair Diverticulosis in the entire examined colon Internal hemorrhoids The examined portion of the ileum was normal No specimens collected   Past Medical History:  Diagnosis Date   Coronary atherosclerosis of native coronary artery    CABG 2006, EDMUNDS   DJD (degenerative joint disease) `   back and knees   Essential hypertension, benign    History of nuclear stress test    11/08 7 mets, no ischemia, EF 73%   Male hypogonadism    Osteomyelitis (HCC)    R tibia 1972, recur 2005   Other and unspecified hyperlipidemia     Past Surgical History:  Procedure Laterality Date   CORONARY ARTERY BYPASS GRAFT  2006   SPINE SURGERY  01/18/2023   (laminectomy surgery) L3,4,5    Current Outpatient Medications  Medication Sig Dispense Refill   amLODipine  (NORVASC ) 10 MG tablet Take 1 tablet (10 mg total) by mouth daily. 90 tablet 3   aspirin 81  MG tablet Take 81 mg by mouth daily.     atorvastatin  (LIPITOR) 80 MG tablet Take 1 tablet (80 mg total) by mouth daily. 90 tablet 3   fluorometholone (FML) 0.1 % ophthalmic suspension 1 drop 4 (four) times daily.     fluticasone (FLONASE) 50 MCG/ACT nasal spray Place into both nostrils as needed for  allergies or rhinitis.     lisinopril  (ZESTRIL ) 20 MG tablet Take 1 tablet (20 mg total) by mouth 2 (two) times daily. 180 tablet 3   naproxen sodium (ANAPROX) 220 MG tablet Take 220 mg by mouth as needed (PAIN).      Omega-3 Fatty Acids (FISH OIL) 1000 MG CAPS Take 2 capsules by mouth daily.     No current facility-administered medications for this visit.    Allergies as of 09/11/2024 - Review Complete 09/11/2024  Allergen Reaction Noted   Codeine Nausea Only 01/13/2023    Family History  Problem Relation Age of Onset   Heart disease Mother        CABG   Lung cancer Mother    Alcoholism Father    Heart attack Neg Hx     Social History   Tobacco Use   Smoking status: Former    Current packs/day: 0.00    Types: Cigarettes    Quit date: 2006    Years since quitting: 19.9   Smokeless tobacco: Current    Types: Snuff  Vaping Use   Vaping status: Never Used  Substance Use Topics   Alcohol use: Yes    Alcohol/week: 8.0 standard drinks of alcohol    Types: 1 Cans of beer, 7 Standard drinks or equivalent per week    Comment: daily in afternoons   Drug use: No     Review of Systems:    Constitutional: No weight loss, fever, chills Cardiovascular: No chest pain Respiratory: No SOB Gastrointestinal: See HPI and otherwise negative   Physical Exam:  Vital signs: BP 130/60   Pulse 78   Ht 5' 8 (1.727 m)   Wt 230 lb (104.3 kg)   BMI 34.97 kg/m   Constitutional: Pleasant, obese male in NAD, alert and cooperative Head:  Normocephalic and atraumatic.  Eyes: No scleral icterus.  Respiratory: Respirations even and unlabored. Lungs clear to auscultation bilaterally.  No wheezes, crackles, or rhonchi.  Cardiovascular:  Regular rate and rhythm. No murmurs. No peripheral edema. Gastrointestinal:  Soft, nondistended, nontender. No rebound or guarding. Normal bowel sounds. No appreciable masses or hepatomegaly. Rectal:  Not performed.  Neurologic:  Alert and oriented x4;   grossly normal neurologically.  Skin:   Dry and intact without significant lesions or rashes. Psychiatric: Oriented to person, place and time. Demonstrates good judgement and reason without abnormal affect or behaviors.   RELEVANT LABS AND IMAGING: CBC    Component Value Date/Time   WBC 7.4 04/06/2023 1623   WBC 19.0 (H) 10/16/2009 2250   RBC 4.66 04/06/2023 1623   RBC 4.65 10/16/2009 2250   HGB 15.1 04/06/2023 1623   HCT 44.0 04/06/2023 1623   PLT 253 04/06/2023 1623   MCV 94 04/06/2023 1623   MCH 32.4 04/06/2023 1623   MCHC 34.3 04/06/2023 1623   MCHC 34.5 10/16/2009 2250   RDW 12.1 04/06/2023 1623   LYMPHSABS 1.1 10/16/2009 2250   MONOABS 0.5 10/16/2009 2250   EOSABS 0.1 10/16/2009 2250   BASOSABS 0.1 10/16/2009 2250    CMP     Component Value Date/Time   NA 134 (L) 04/11/2024 1131  NA 134 04/06/2023 1623   K 4.4 04/11/2024 1131   CL 99 04/11/2024 1131   CO2 29 04/11/2024 1131   GLUCOSE 156 (H) 04/11/2024 1131   BUN 13 04/11/2024 1131   BUN 14 04/06/2023 1623   CREATININE 0.88 04/11/2024 1131   CREATININE 0.87 06/24/2016 0738   CALCIUM  9.3 04/11/2024 1131   PROT 6.7 04/11/2024 1131   PROT 6.6 09/12/2018 0735   ALBUMIN 4.3 04/11/2024 1131   ALBUMIN 4.3 09/12/2018 0735   AST 23 04/11/2024 1131   ALT 25 04/11/2024 1131   ALKPHOS 61 04/11/2024 1131   BILITOT 1.5 (H) 04/11/2024 1131   BILITOT 1.0 09/12/2018 0735   GFRNONAA 76 09/26/2019 0939   GFRAA 88 09/26/2019 0939     Assessment/Plan:   Assessment & Plan Dysphagia Odynophagia Heartburn Intermittent dysphagia with burning sensation during swallowing for 3-4 months, primarily with the first bite of a meal. Occasional heartburn, especially at night after spicy meals.  No prior EGD.  Not on any antireflux medications at this time.  - Start omeprazole 20 mg daily, patient prefers to get over-the-counter. - Scheduled upper endoscopy. I thoroughly discussed the procedure with the patient to include nature  of the procedure, alternatives, benefits, and risks (including but not limited to bleeding, infection, perforation, anesthesia/cardiac/pulmonary complications). Patient verbalized understanding and gave verbal consent to proceed with procedure.  - Labs today: CBC   [name], PA-C [facility] 09/11/2024, 10:30 AM  Patient Care Team: Purcell Emil Schanz, MD as PCP - General (Internal Medicine) Floretta Mallard, MD as PCP - Cardiology (Cardiology) Dr. Burnis Centers (Optometry)

## 2024-10-04 ENCOUNTER — Ambulatory Visit: Admitting: Gastroenterology

## 2024-10-04 ENCOUNTER — Encounter: Payer: Self-pay | Admitting: Gastroenterology

## 2024-10-04 VITALS — BP 98/47 | HR 46 | Temp 98.2°F | Resp 12 | Ht 68.0 in | Wt 230.0 lb

## 2024-10-04 DIAGNOSIS — K222 Esophageal obstruction: Secondary | ICD-10-CM | POA: Diagnosis not present

## 2024-10-04 DIAGNOSIS — R1319 Other dysphagia: Secondary | ICD-10-CM

## 2024-10-04 DIAGNOSIS — K319 Disease of stomach and duodenum, unspecified: Secondary | ICD-10-CM

## 2024-10-04 DIAGNOSIS — K2289 Other specified disease of esophagus: Secondary | ICD-10-CM | POA: Diagnosis not present

## 2024-10-04 DIAGNOSIS — K2970 Gastritis, unspecified, without bleeding: Secondary | ICD-10-CM

## 2024-10-04 MED ORDER — SODIUM CHLORIDE 0.9 % IV SOLN: 500.0000 mL | Freq: Once | INTRAVENOUS | Status: DC

## 2024-10-04 MED ORDER — PANTOPRAZOLE SODIUM 40 MG PO TBEC: 40.0000 mg | DELAYED_RELEASE_TABLET | Freq: Two times a day (BID) | ORAL | 3 refills | Status: AC

## 2024-10-04 NOTE — Progress Notes (Signed)
 Report to PACU, RN, vss, BBS= Clear.

## 2024-10-04 NOTE — Op Note (Signed)
 [facility] Patient Name: Craig Knapp Procedure Date: 10/04/2024 8:28 AM MRN: [id number] Endoscopist: Gustav ALONSO Mcgee , MD, [id number] Age: 78 Referring MD:  Date of Birth: 1946-03-13 Gender: Male Account #: [id number] Procedure:                Upper GI endoscopy Indications:              Dysphagia Medicines:                Monitored Anesthesia Care Procedure:                Pre-Anesthesia Assessment:                           - Prior to the procedure, a History and Physical                            was performed, and patient medications and                            allergies were reviewed. The patient's tolerance of                            previous anesthesia was also reviewed. The risks                            and benefits of the procedure and the sedation                            options and risks were discussed with the patient.                            All questions were answered, and informed consent                            was obtained. Prior Anticoagulants: The patient has                            taken no anticoagulant or antiplatelet agents. ASA                            Grade Assessment: III - A patient with severe                            systemic disease. After reviewing the risks and                            benefits, the patient was deemed in satisfactory                            condition to undergo the procedure.                           After obtaining informed consent, the endoscope was  passed under direct vision. Throughout the                            procedure, the patient's blood pressure, pulse, and                            oxygen saturations were monitored continuously. The                            GIF [id number] #[id number] was introduced through the                            mouth, and advanced to the second part of duodenum.                            The upper GI endoscopy was  accomplished without                            difficulty. The patient tolerated the procedure                            well. Scope In: Scope Out: Findings:                 One benign-appearing, intrinsic moderate                            (circumferential scarring or stenosis; an endoscope                            may pass) stenosis was found 39 to 40 cm from the                            incisors. This stenosis measured 1.3 cm (inner                            diameter) x 1 cm (in length). The stenosis was                            traversed. A TTS dilator was passed through the                            scope. Dilation with a 15-16.5-18 mm x 8 cm CRE                            balloon dilator was performed to 18 mm. The                            dilation site was examined following endoscope                            reinsertion and showed mild mucosal disruption.  A 4 cm hiatal hernia was present.                           Patchy mild inflammation characterized by                            congestion (edema), erosions, erythema and                            friability was found in the cardia, in the gastric                            antrum and in the prepyloric region of the stomach.                            Biopsies were taken with a cold forceps for                            Helicobacter pylori testing.                           The cardia and gastric fundus were normal otherwise                            on retroflexion.                           The examined duodenum was normal.                           Circumferential salmon-colored mucosa was present                            from 20 to 24 cm. No other visible abnormalities                            were present. The maximum longitudinal extent of                            these esophageal mucosal changes was 4 cm in                            length. Consistent with inlet  patch. Complications:            No immediate complications. Estimated Blood Loss:     Estimated blood loss was minimal. Impression:               - Benign-appearing esophageal stenosis. Dilated.                           - 4 cm hiatal hernia.                           - Gastritis. Biopsied.                           -  Normal examined duodenum.                           - Salmon-colored mucosa in upper esophagus                            consistent with benign inlet patch. Recommendation:           - Follow an antireflux regimen.                           - Resume previous diet.                           - Continue present medications.                           - Await pathology results.                           - Use Protonix  (pantoprazole ) 40 mg PO BID. Rx for                            90 days with 3 refills                           - Follow up in GI office with APP in 2-3 months,                            will plan for repeat EGD with esophageal dilation                            in 3 months Gustav ALONSO Mcgee, MD 10/04/2024 9:27:12 AM This report has been signed electronically.

## 2024-10-04 NOTE — Progress Notes (Signed)
 [facility] History and Physical   Primary Care Physician:  Purcell Emil Schanz, MD   Reason for Procedure:  dysphagia    Plan:    EGD with possible interventions as needed     HPI: Craig Knapp is a very pleasant 78 y.o. male here for EGD with possible esophageal dilation for dysphagia . Please refer to office visit by [name] for details  The risks and benefits as well as alternatives of endoscopic procedure(s) have been discussed and reviewed.  The patient was provided an opportunity to ask questions and all were answered. The patient agreed with the plan and demonstrated an understanding of the instructions.   Past Medical History:  Diagnosis Date   Coronary atherosclerosis of native coronary artery    CABG 2006, EDMUNDS   DJD (degenerative joint disease) `   back and knees   Essential hypertension, benign    History of nuclear stress test    11/08 7 mets, no ischemia, EF 73%   Male hypogonadism    Osteomyelitis (HCC)    R tibia 1972, recur 2005   Other and unspecified hyperlipidemia     Past Surgical History:  Procedure Laterality Date   CORONARY ARTERY BYPASS GRAFT  2006   SPINE SURGERY  01/18/2023   (laminectomy surgery) L3,4,5    Prior to Admission medications  Medication Sig Start Date End Date Taking? Authorizing Provider  amLODipine  (NORVASC ) 10 MG tablet Take 1 tablet (10 mg total) by mouth daily. 04/11/24  Yes SagardiaEmil Schanz, MD  aspirin 81 MG tablet Take 81 mg by mouth daily.   Yes [provider]  fluorometholone (FML) 0.1 % ophthalmic suspension 1 drop 4 (four) times daily. 08/30/24  Yes [provider]  fluticasone (FLONASE) 50 MCG/ACT nasal spray Place into both nostrils as needed for allergies or rhinitis.   Yes [provider]  lisinopril  (ZESTRIL ) 20 MG tablet Take 1 tablet (20 mg total) by mouth 2 (two) times daily. 04/11/24  Yes Sagardia, Emil Schanz, MD  naproxen sodium (ANAPROX) 220 MG tablet  Take 220 mg by mouth as needed (PAIN).    Yes [provider]  Omega-3 Fatty Acids (FISH OIL) 1000 MG CAPS Take 2 capsules by mouth daily.   Yes [provider]  atorvastatin  (LIPITOR) 80 MG tablet Take 1 tablet (80 mg total) by mouth daily. 04/11/24 09/11/24  Purcell Emil Schanz, MD    Current Outpatient Medications  Medication Sig Dispense Refill   amLODipine  (NORVASC ) 10 MG tablet Take 1 tablet (10 mg total) by mouth daily. 90 tablet 3   aspirin 81 MG tablet Take 81 mg by mouth daily.     fluorometholone (FML) 0.1 % ophthalmic suspension 1 drop 4 (four) times daily.     fluticasone (FLONASE) 50 MCG/ACT nasal spray Place into both nostrils as needed for allergies or rhinitis.     lisinopril  (ZESTRIL ) 20 MG tablet Take 1 tablet (20 mg total) by mouth 2 (two) times daily. 180 tablet 3   naproxen sodium (ANAPROX) 220 MG tablet Take 220 mg by mouth as needed (PAIN).      Omega-3 Fatty Acids (FISH OIL) 1000 MG CAPS Take 2 capsules by mouth daily.     atorvastatin  (LIPITOR) 80 MG tablet Take 1 tablet (80 mg total) by mouth daily. 90 tablet 3   Current Facility-Administered Medications  Medication Dose Route Frequency Provider Last Rate Last Admin   0.9 %  sodium chloride  infusion  500 mL Intravenous Once Jesselee Poth  V, MD        Allergies as of 10/04/2024 - Review Complete 10/04/2024  Allergen Reaction Noted   Codeine Nausea Only 01/13/2023    Family History  Problem Relation Age of Onset   Heart disease Mother        CABG   Lung cancer Mother    Alcoholism Father    Heart attack Neg Hx     Social History   Socioeconomic History   Marital status: Divorced    Spouse name: Not on file   Number of children: 3   Years of education: Not on file   Highest education level: Not on file  Occupational History   Occupation: Retired  Tobacco Use   Smoking status: Former    Current packs/day: 0.00    Types: Cigarettes    Quit date: 2006    Years since  quitting: 19.9   Smokeless tobacco: Current    Types: Snuff  Vaping Use   Vaping status: Never Used  Substance and Sexual Activity   Alcohol use: Yes    Alcohol/week: 8.0 standard drinks of alcohol    Types: 1 Cans of beer, 7 Standard drinks or equivalent per week    Comment: daily in afternoons   Drug use: No   Sexual activity: Yes  Other Topics Concern   Not on file  Social History Narrative   Does not live alone, has a partner   Social Drivers of Health   Tobacco Use: High Risk (10/04/2024)   Patient History    Smoking Tobacco Use: Former    Smokeless Tobacco Use: Current    Passive Exposure: Not on Actuary Strain: Low Risk (07/04/2024)   Overall Financial Resource Strain (CARDIA)    Difficulty of Paying Living Expenses: Not hard at all  Food Insecurity: No Food Insecurity (07/04/2024)   Epic    Worried About Programme Researcher, Broadcasting/film/video in the Last Year: Never true    Ran Out of Food in the Last Year: Never true  Transportation Needs: No Transportation Needs (07/04/2024)   Epic    Lack of Transportation (Medical): No    Lack of Transportation (Non-Medical): No  Physical Activity: Sufficiently Active (07/04/2024)   Exercise Vital Sign    Days of Exercise per Week: 7 days    Minutes of Exercise per Session: 30 min  Stress: No Stress Concern Present (07/04/2024)   [employer] of Occupational Health - Occupational Stress Questionnaire    Feeling of Stress: Not at all  Social Connections: Socially Isolated (07/04/2024)   Social Connection and Isolation Panel    Frequency of Communication with Friends and Family: Once a week    Frequency of Social Gatherings with Friends and Family: Once a week    Attends Religious Services: Never    Database Administrator or Organizations: No    Attends Banker Meetings: Never    Marital Status: Living with partner  Intimate Partner Violence: Not At Risk (07/04/2024)   Epic    Fear of Current or Ex-Partner: No     Emotionally Abused: No    Physically Abused: No    Sexually Abused: No  Depression (PHQ2-9): Low Risk (07/04/2024)   Depression (PHQ2-9)    PHQ-2 Score: 0  Alcohol Screen: Low Risk (07/04/2024)   Alcohol Screen    Last Alcohol Screening Score (AUDIT): 4  Housing: Unknown (07/04/2024)   Epic    Unable to Pay for Housing in the Last Year: No  Number of Times Moved in the Last Year: Not on file    Homeless in the Last Year: No  Utilities: Not At Risk (07/04/2024)   Epic    Threatened with loss of utilities: No  Health Literacy: Adequate Health Literacy (07/04/2024)   B1300 Health Literacy    Frequency of need for help with medical instructions: Never    Review of Systems:  All other review of systems negative except as mentioned in the HPI.  Physical Exam: Vital signs in last 24 hours: BP 132/60   Pulse (!) 52   Temp 98.2 F (36.8 C)   Ht 5' 8 (1.727 m)   Wt 230 lb (104.3 kg)   SpO2 94%   BMI 34.97 kg/m  General:   Alert, NAD Lungs:  Clear .   Heart:  Regular rate and rhythm Abdomen:  Soft, nontender and nondistended. Neuro/Psych:  Alert and cooperative. Normal mood and affect. A and O x 3  Reviewed labs, radiology imaging, old records and pertinent past GI work up  Patient is appropriate for planned procedure(s) and anesthesia in an ambulatory setting   K. Veena Ryen Heitmeyer , MD [phone]

## 2024-10-04 NOTE — Progress Notes (Signed)
 Called to room to assist during endoscopic procedure.  Patient ID and intended procedure confirmed with present staff. Received instructions for my participation in the procedure from the performing physician.

## 2024-10-04 NOTE — Patient Instructions (Signed)

## 2024-10-04 NOTE — Progress Notes (Signed)
 Pt's states no medical or surgical changes since previsit or office visit.

## 2024-10-05 ENCOUNTER — Telehealth: Payer: Self-pay

## 2024-10-05 NOTE — Telephone Encounter (Signed)
°  Follow up Call-     10/04/2024    8:28 AM  Call back number  Post procedure Call Back phone  # [phone]  Permission to leave phone message Yes     Patient questions:  Do you have a fever, pain , or abdominal swelling? No. Pain Score  0 *  Have you tolerated food without any problems? Yes.    Have you been able to return to your normal activities? Yes.    Do you have any questions about your discharge instructions: Diet   No. Medications  No. Follow up visit  No.  Do you have questions or concerns about your Care? No.  Actions: * If pain score is 4 or above: No action needed, pain <4.

## 2024-10-06 LAB — SURGICAL PATHOLOGY

## 2024-10-25 ENCOUNTER — Encounter: Payer: Self-pay | Admitting: Emergency Medicine

## 2024-10-25 ENCOUNTER — Ambulatory Visit: Admitting: Emergency Medicine

## 2024-10-25 VITALS — BP 128/78 | HR 65 | Temp 98.0°F | Ht 68.0 in | Wt 228.0 lb

## 2024-10-25 DIAGNOSIS — E785 Hyperlipidemia, unspecified: Secondary | ICD-10-CM

## 2024-10-25 DIAGNOSIS — R1319 Other dysphagia: Secondary | ICD-10-CM | POA: Diagnosis not present

## 2024-10-25 DIAGNOSIS — I10 Essential (primary) hypertension: Secondary | ICD-10-CM | POA: Diagnosis not present

## 2024-10-25 NOTE — Assessment & Plan Note (Signed)
Chronic stable condition Continue atorvastatin 80 mg daily Diet and nutrition discussed 

## 2024-10-25 NOTE — Assessment & Plan Note (Signed)
 Much improved after esophageal dilation Pantoprazole  40 mg twice a day helping a lot Diet and nutrition discussed

## 2024-10-25 NOTE — Assessment & Plan Note (Deleted)
 Hemoglobin A1c is 6.1 Diet and nutrition discussed Cardiovascular risks associated with diabetes discussed Benefits of exercise discussed

## 2024-10-25 NOTE — Assessment & Plan Note (Signed)
 BP Readings from Last 3 Encounters:  10/25/24 128/78  10/04/24 (!) 98/47  09/11/24 130/60  Elevated blood pressure reading in the office but normal readings at home Continue amlodipine  10 mg daily and lisinopril  20 mg daily Cardiovascular risks associated with hypertension discussed Diet and nutrition discussed Benefits of exercise discussed

## 2024-10-25 NOTE — Progress Notes (Signed)
 Craig Knapp 79 y.o.   Chief Complaint  Patient presents with   Follow-up    To discuss about a test for the GI doctor he had done last month     HISTORY OF PRESENT ILLNESS: This is a 79 y.o. male here for follow-up of chronic medical conditions Since our last visit he was able to follow-up with GI doctor for evaluation of dysphagia Upper endoscopy report reviewed with patient.  It showed benign esophageal stenosis with gastritis.  Dilated.  Started on pantoprazole  40 mg twice a day.  Symptoms much improved. Pathology report was negative for cancer. Overall doing well.  No other complaints or medical concerns today.  HPI   Prior to Admission medications  Medication Sig Start Date End Date Taking? Authorizing Provider  amLODipine  (NORVASC ) 10 MG tablet Take 1 tablet (10 mg total) by mouth daily. 04/11/24  Yes SagardiaEmil Schanz, MD  aspirin 81 MG tablet Take 81 mg by mouth daily.   Yes [provider]  atorvastatin  (LIPITOR) 80 MG tablet Take 1 tablet (80 mg total) by mouth daily. 04/11/24 10/25/24 Yes Kashis Penley Jose, MD  fluorometholone (FML) 0.1 % ophthalmic suspension 1 drop 4 (four) times daily. 08/30/24  Yes [provider]  fluticasone (FLONASE) 50 MCG/ACT nasal spray Place into both nostrils as needed for allergies or rhinitis.   Yes [provider]  lisinopril  (ZESTRIL ) 20 MG tablet Take 1 tablet (20 mg total) by mouth 2 (two) times daily. 04/11/24  Yes Avana Kreiser, Emil Schanz, MD  naproxen sodium (ANAPROX) 220 MG tablet Take 220 mg by mouth as needed (PAIN).    Yes [provider]  Omega-3 Fatty Acids (FISH OIL) 1000 MG CAPS Take 2 capsules by mouth daily.   Yes [provider]  pantoprazole  (PROTONIX ) 40 MG tablet Take 1 tablet (40 mg total) by mouth 2 (two) times daily. 10/04/24  Yes Shila Gustav GAILS, MD    Allergies[1]  Patient Active Problem List   Diagnosis Date Noted   Esophageal dysphagia 07/18/2024   Balance  problem 07/06/2023   Obesity, unspecified 07/03/2014   Hypogonadism male 03/27/2011   Dyslipidemia 08/06/2008   Essential hypertension 08/06/2008   Coronary atherosclerosis 08/06/2008   Chronic osteomyelitis of lower leg (HCC) 08/06/2008   CORONARY ARTERY BYPASS GRAFT, HX OF 08/06/2008   HLD (hyperlipidemia) 08/06/2008    Past Medical History:  Diagnosis Date   Coronary atherosclerosis of native coronary artery    CABG 2006, EDMUNDS   DJD (degenerative joint disease) `   back and knees   Essential hypertension, benign    History of nuclear stress test    11/08 7 mets, no ischemia, EF 73%   Male hypogonadism    Osteomyelitis (HCC)    R tibia 1972, recur 2005   Other and unspecified hyperlipidemia     Past Surgical History:  Procedure Laterality Date   CORONARY ARTERY BYPASS GRAFT  2006   SPINE SURGERY  01/18/2023   (laminectomy surgery) L3,4,5    Social History   Socioeconomic History   Marital status: Divorced    Spouse name: Not on file   Number of children: 3   Years of education: Not on file   Highest education level: Associate degree: occupational, scientist, product/process development, or vocational program  Occupational History   Occupation: Retired  Tobacco Use   Smoking status: Former    Current packs/day: 0.00    Types: Cigarettes    Quit date: 2006    Years since quitting: 20.0  Smokeless tobacco: Current    Types: Snuff  Vaping Use   Vaping status: Never Used  Substance and Sexual Activity   Alcohol use: Yes    Alcohol/week: 8.0 standard drinks of alcohol    Types: 1 Cans of beer, 7 Standard drinks or equivalent per week    Comment: daily in afternoons   Drug use: No   Sexual activity: Yes  Other Topics Concern   Not on file  Social History Narrative   Does not live alone, has a partner   Social Drivers of Health   Tobacco Use: High Risk (10/25/2024)   Patient History    Smoking Tobacco Use: Former    Smokeless Tobacco Use: Current    Passive Exposure: Not on Programmer, Applications Strain: Low Risk (10/24/2024)   Overall Financial Resource Strain (CARDIA)    Difficulty of Paying Living Expenses: Not hard at all  Food Insecurity: No Food Insecurity (10/24/2024)   Epic    Worried About Radiation Protection Practitioner of Food in the Last Year: Never true    Ran Out of Food in the Last Year: Never true  Transportation Needs: No Transportation Needs (10/24/2024)   Epic    Lack of Transportation (Medical): No    Lack of Transportation (Non-Medical): No  Physical Activity: Unknown (10/24/2024)   Exercise Vital Sign    Days of Exercise per Week: Patient declined    Minutes of Exercise per Session: Not on file  Stress: No Stress Concern Present (10/24/2024)   [employer] of Occupational Health - Occupational Stress Questionnaire    Feeling of Stress: Not at all  Social Connections: Moderately Isolated (10/24/2024)   Social Connection and Isolation Panel    Frequency of Communication with Friends and Family: More than three times a week    Frequency of Social Gatherings with Friends and Family: More than three times a week    Attends Religious Services: Never    Database Administrator or Organizations: No    Attends Engineer, Structural: Not on file    Marital Status: Living with partner  Intimate Partner Violence: Not At Risk (07/04/2024)   Epic    Fear of Current or Ex-Partner: No    Emotionally Abused: No    Physically Abused: No    Sexually Abused: No  Depression (PHQ2-9): Low Risk (07/04/2024)   Depression (PHQ2-9)    PHQ-2 Score: 0  Alcohol Screen: Medium Risk (10/24/2024)   Alcohol Screen    Last Alcohol Screening Score (AUDIT): 8  Housing: Unknown (10/24/2024)   Epic    Unable to Pay for Housing in the Last Year: No    Number of Times Moved in the Last Year: Not on file    Homeless in the Last Year: No  Utilities: Not At Risk (07/04/2024)   Epic    Threatened with loss of utilities: No  Health Literacy: Adequate Health Literacy (07/04/2024)   B1300  Health Literacy    Frequency of need for help with medical instructions: Never    Family History  Problem Relation Age of Onset   Heart disease Mother        CABG   Lung cancer Mother    Alcoholism Father    Heart attack Neg Hx      Review of Systems  Constitutional: Negative.  Negative for chills and fever.  HENT: Negative.  Negative for congestion and sore throat.   Respiratory:  Negative for cough and shortness of breath.  Cardiovascular: Negative.  Negative for chest pain and palpitations.  Gastrointestinal:  Negative for abdominal pain, diarrhea, nausea and vomiting.  Genitourinary: Negative.  Negative for dysuria and hematuria.  Skin: Negative.  Negative for rash.  Neurological: Negative.  Negative for dizziness and headaches.  All other systems reviewed and are negative.   Vitals:   10/25/24 1048 10/25/24 1052  BP: (!) 142/80 (!) 142/80  Pulse: 65   Temp: 98 F (36.7 C)   SpO2: 97%     Physical Exam Vitals reviewed.  Constitutional:      Appearance: Normal appearance.  HENT:     Head: Normocephalic.     Mouth/Throat:     Mouth: Mucous membranes are moist.     Pharynx: Oropharynx is clear.  Eyes:     Extraocular Movements: Extraocular movements intact.     Pupils: Pupils are equal, round, and reactive to light.  Cardiovascular:     Rate and Rhythm: Normal rate and regular rhythm.     Pulses: Normal pulses.     Heart sounds: Normal heart sounds.  Pulmonary:     Effort: Pulmonary effort is normal.     Breath sounds: Normal breath sounds.  Abdominal:     Palpations: Abdomen is soft.     Tenderness: There is no abdominal tenderness.  Musculoskeletal:     Cervical back: No tenderness.  Lymphadenopathy:     Cervical: No cervical adenopathy.  Skin:    General: Skin is warm and dry.     Capillary Refill: Capillary refill takes less than 2 seconds.  Neurological:     General: No focal deficit present.     Mental Status: He is alert and oriented to  person, place, and time.  Psychiatric:        Mood and Affect: Mood normal.        Behavior: Behavior normal.      ASSESSMENT & PLAN: A total of 40 minutes was spent with the patient and counseling/coordination of care regarding preparing for this visit, review of most recent office visit notes, review of multiple chronic medical conditions and their management, review of all medications, review of most recent bloodwork results, review of health maintenance items, education on nutrition, prognosis, documentation, and need for follow up.  Problem List Items Addressed This Visit       Cardiovascular and Mediastinum   Essential hypertension   BP Readings from Last 3 Encounters:  10/25/24 128/78  10/04/24 (!) 98/47  09/11/24 130/60  Elevated blood pressure reading in the office but normal readings at home Continue amlodipine  10 mg daily and lisinopril  20 mg daily Cardiovascular risks associated with hypertension discussed Diet and nutrition discussed Benefits of exercise discussed         Digestive   Esophageal dysphagia - Primary   Much improved after esophageal dilation Pantoprazole  40 mg twice a day helping a lot Diet and nutrition discussed        Other   Dyslipidemia   Chronic stable condition Continue atorvastatin  80 mg daily Diet and nutrition discussed          Patient Instructions  Health Maintenance After Age 36 After age 50, you are at a higher risk for certain long-term diseases and infections as well as injuries from falls. Falls are a major cause of broken bones and head injuries in people who are older than age 26. Getting regular preventive care can help to keep you healthy and well. Preventive care includes getting regular testing and making  lifestyle changes as recommended by your health care provider. Talk with your health care provider about: Which screenings and tests you should have. A screening is a test that checks for a disease when you have no  symptoms. A diet and exercise plan that is right for you. What should I know about screenings and tests to prevent falls? Screening and testing are the best ways to find a health problem early. Early diagnosis and treatment give you the best chance of managing medical conditions that are common after age 40. Certain conditions and lifestyle choices may make you more likely to have a fall. Your health care provider may recommend: Regular vision checks. Poor vision and conditions such as cataracts can make you more likely to have a fall. If you wear glasses, make sure to get your prescription updated if your vision changes. Medicine review. Work with your health care provider to regularly review all of the medicines you are taking, including over-the-counter medicines. Ask your health care provider about any side effects that may make you more likely to have a fall. Tell your health care provider if any medicines that you take make you feel dizzy or sleepy. Strength and balance checks. Your health care provider may recommend certain tests to check your strength and balance while standing, walking, or changing positions. Foot health exam. Foot pain and numbness, as well as not wearing proper footwear, can make you more likely to have a fall. Screenings, including: Osteoporosis screening. Osteoporosis is a condition that causes the bones to get weaker and break more easily. Blood pressure screening. Blood pressure changes and medicines to control blood pressure can make you feel dizzy. Depression screening. You may be more likely to have a fall if you have a fear of falling, feel depressed, or feel unable to do activities that you used to do. Alcohol use screening. Using too much alcohol can affect your balance and may make you more likely to have a fall. Follow these instructions at home: Lifestyle Do not drink alcohol if: Your health care provider tells you not to drink. If you drink alcohol: Limit  how much you have to: 0-1 drink a day for women. 0-2 drinks a day for men. Know how much alcohol is in your drink. In the [place] one drink equals one 12 oz bottle of beer (355 mL), one 5 oz glass of wine (148 mL), or one 1 oz glass of hard liquor (44 mL). Do not use any products that contain nicotine or tobacco. These products include cigarettes, chewing tobacco, and vaping devices, such as e-cigarettes. If you need help quitting, ask your health care provider. Activity  Follow a regular exercise program to stay fit. This will help you maintain your balance. Ask your health care provider what types of exercise are appropriate for you. If you need a cane or walker, use it as recommended by your health care provider. Wear supportive shoes that have nonskid soles. Safety  Remove any tripping hazards, such as rugs, cords, and clutter. Install safety equipment such as grab bars in bathrooms and safety rails on stairs. Keep rooms and walkways well-lit. General instructions Talk with your health care provider about your risks for falling. Tell your health care provider if: You fall. Be sure to tell your health care provider about all falls, even ones that seem minor. You feel dizzy, tiredness (fatigue), or off-balance. Take over-the-counter and prescription medicines only as told by your health care provider. These include supplements. Eat a healthy  diet and maintain a healthy weight. A healthy diet includes low-fat dairy products, low-fat (lean) meats, and fiber from whole grains, beans, and lots of fruits and vegetables. Stay current with your vaccines. Schedule regular health, dental, and eye exams. Summary Having a healthy lifestyle and getting preventive care can help to protect your health and wellness after age 77. Screening and testing are the best way to find a health problem early and help you avoid having a fall. Early diagnosis and treatment give you the best chance for managing medical  conditions that are more common for people who are older than age 82. Falls are a major cause of broken bones and head injuries in people who are older than age 23. Take precautions to prevent a fall at home. Work with your health care provider to learn what changes you can make to improve your health and wellness and to prevent falls. This information is not intended to replace advice given to you by your health care provider. Make sure you discuss any questions you have with your health care provider. Document Revised: 02/24/2021 Document Reviewed: 02/24/2021 Elsevier Patient Education  2024 [employer] Inc.     Emil Schaumann, MD [facility]    [1]  Allergies Allergen Reactions   Codeine Nausea Only    Crazy feeling in my head

## 2024-10-25 NOTE — Patient Instructions (Signed)
 Health Maintenance After Age 78 After age 27, you are at a higher risk for certain long-term diseases and infections as well as injuries from falls. Falls are a major cause of broken bones and head injuries in people who are older than age 73. Getting regular preventive care can help to keep you healthy and well. Preventive care includes getting regular testing and making lifestyle changes as recommended by your health care provider. Talk with your health care provider about: Which screenings and tests you should have. A screening is a test that checks for a disease when you have no symptoms. A diet and exercise plan that is right for you. What should I know about screenings and tests to prevent falls? Screening and testing are the best ways to find a health problem early. Early diagnosis and treatment give you the best chance of managing medical conditions that are common after age 90. Certain conditions and lifestyle choices may make you more likely to have a fall. Your health care provider may recommend: Regular vision checks. Poor vision and conditions such as cataracts can make you more likely to have a fall. If you wear glasses, make sure to get your prescription updated if your vision changes. Medicine review. Work with your health care provider to regularly review all of the medicines you are taking, including over-the-counter medicines. Ask your health care provider about any side effects that may make you more likely to have a fall. Tell your health care provider if any medicines that you take make you feel dizzy or sleepy. Strength and balance checks. Your health care provider may recommend certain tests to check your strength and balance while standing, walking, or changing positions. Foot health exam. Foot pain and numbness, as well as not wearing proper footwear, can make you more likely to have a fall. Screenings, including: Osteoporosis screening. Osteoporosis is a condition that causes  the bones to get weaker and break more easily. Blood pressure screening. Blood pressure changes and medicines to control blood pressure can make you feel dizzy. Depression screening. You may be more likely to have a fall if you have a fear of falling, feel depressed, or feel unable to do activities that you used to do. Alcohol  use screening. Using too much alcohol  can affect your balance and may make you more likely to have a fall. Follow these instructions at home: Lifestyle Do not drink alcohol  if: Your health care provider tells you not to drink. If you drink alcohol : Limit how much you have to: 0-1 drink a day for women. 0-2 drinks a day for men. Know how much alcohol  is in your drink. In the [place] one drink equals one 12 oz bottle of beer (355 mL), one 5 oz glass of wine (148 mL), or one 1 oz glass of hard liquor (44 mL). Do not use any products that contain nicotine or tobacco. These products include cigarettes, chewing tobacco, and vaping devices, such as e-cigarettes. If you need help quitting, ask your health care provider. Activity  Follow a regular exercise program to stay fit. This will help you maintain your balance. Ask your health care provider what types of exercise are appropriate for you. If you need a cane or walker, use it as recommended by your health care provider. Wear supportive shoes that have nonskid soles. Safety  Remove any tripping hazards, such as rugs, cords, and clutter. Install safety equipment such as grab bars in bathrooms and safety rails on stairs. Keep rooms and walkways  well-lit. General instructions Talk with your health care provider about your risks for falling. Tell your health care provider if: You fall. Be sure to tell your health care provider about all falls, even ones that seem minor. You feel dizzy, tiredness (fatigue), or off-balance. Take over-the-counter and prescription medicines only as told by your health care provider. These include  supplements. Eat a healthy diet and maintain a healthy weight. A healthy diet includes low-fat dairy products, low-fat (lean) meats, and fiber from whole grains, beans, and lots of fruits and vegetables. Stay current with your vaccines. Schedule regular health, dental, and eye exams. Summary Having a healthy lifestyle and getting preventive care can help to protect your health and wellness after age 15. Screening and testing are the best way to find a health problem early and help you avoid having a fall. Early diagnosis and treatment give you the best chance for managing medical conditions that are more common for people who are older than age 42. Falls are a major cause of broken bones and head injuries in people who are older than age 64. Take precautions to prevent a fall at home. Work with your health care provider to learn what changes you can make to improve your health and wellness and to prevent falls. This information is not intended to replace advice given to you by your health care provider. Make sure you discuss any questions you have with your health care provider. Document Revised: 02/24/2021 Document Reviewed: 02/24/2021 Elsevier Patient Education  2024 [employer].

## 2024-11-16 ENCOUNTER — Ambulatory Visit: Payer: Self-pay | Admitting: Gastroenterology

## 2024-11-18 ENCOUNTER — Encounter (HOSPITAL_COMMUNITY): Payer: Self-pay

## 2024-11-18 ENCOUNTER — Other Ambulatory Visit: Payer: Self-pay

## 2024-11-18 ENCOUNTER — Inpatient Hospital Stay (HOSPITAL_COMMUNITY)
Admission: EM | Admit: 2024-11-18 | Source: Home / Self Care | Attending: Internal Medicine | Admitting: Internal Medicine

## 2024-11-18 ENCOUNTER — Emergency Department (HOSPITAL_COMMUNITY)

## 2024-11-18 DIAGNOSIS — S82254B Nondisplaced comminuted fracture of shaft of right tibia, initial encounter for open fracture type I or II: Secondary | ICD-10-CM

## 2024-11-18 DIAGNOSIS — M86461 Chronic osteomyelitis with draining sinus, right tibia and fibula: Secondary | ICD-10-CM

## 2024-11-18 DIAGNOSIS — W19XXXA Unspecified fall, initial encounter: Principal | ICD-10-CM

## 2024-11-18 DIAGNOSIS — E782 Mixed hyperlipidemia: Secondary | ICD-10-CM

## 2024-11-18 DIAGNOSIS — S82251B Displaced comminuted fracture of shaft of right tibia, initial encounter for open fracture type I or II: Secondary | ICD-10-CM

## 2024-11-18 DIAGNOSIS — I10 Essential (primary) hypertension: Secondary | ICD-10-CM

## 2024-11-18 DIAGNOSIS — K219 Gastro-esophageal reflux disease without esophagitis: Secondary | ICD-10-CM

## 2024-11-18 DIAGNOSIS — S82201A Unspecified fracture of shaft of right tibia, initial encounter for closed fracture: Secondary | ICD-10-CM | POA: Diagnosis present

## 2024-11-18 DIAGNOSIS — E66811 Obesity, class 1: Secondary | ICD-10-CM

## 2024-11-18 DIAGNOSIS — E785 Hyperlipidemia, unspecified: Secondary | ICD-10-CM

## 2024-11-18 LAB — COMPREHENSIVE METABOLIC PANEL WITH GFR
ALT: 30 U/L (ref 0–44)
AST: 36 U/L (ref 15–41)
Albumin: 4.2 g/dL (ref 3.5–5.0)
Alkaline Phosphatase: 64 U/L (ref 38–126)
Anion gap: 11 (ref 5–15)
BUN: 13 mg/dL (ref 8–23)
CO2: 24 mmol/L (ref 22–32)
Calcium: 9.2 mg/dL (ref 8.9–10.3)
Chloride: 101 mmol/L (ref 98–111)
Creatinine, Ser: 1.12 mg/dL (ref 0.61–1.24)
GFR, Estimated: >60 mL/min
Glucose, Bld: 122 mg/dL — ABNORMAL HIGH (ref 70–99)
Potassium: 4.8 mmol/L (ref 3.5–5.1)
Sodium: 135 mmol/L (ref 135–145)
Total Bilirubin: 0.7 mg/dL (ref 0.0–1.2)
Total Protein: 7 g/dL (ref 6.5–8.1)

## 2024-11-18 LAB — CBC
HCT: 43.4 % (ref 39.0–52.0)
Hemoglobin: 14.7 g/dL (ref 13.0–17.0)
MCH: 32.9 pg (ref 26.0–34.0)
MCHC: 33.9 g/dL (ref 30.0–36.0)
MCV: 97.1 fL (ref 80.0–100.0)
Platelets: 244 10*3/uL (ref 150–400)
RBC: 4.47 MIL/uL (ref 4.22–5.81)
RDW: 12.4 % (ref 11.5–15.5)
WBC: 15.1 10*3/uL — ABNORMAL HIGH (ref 4.0–10.5)
nRBC: 0 % (ref 0.0–0.2)

## 2024-11-18 MED ORDER — AMLODIPINE BESYLATE 10 MG PO TABS
10.0000 mg | ORAL_TABLET | Freq: Every day | ORAL | Status: AC
  Administered 2024-11-19 – 2024-11-24 (×5): 10 mg via ORAL
  Filled 2024-11-18 (×5): qty 1

## 2024-11-18 MED ORDER — VANCOMYCIN HCL 1750 MG/350ML IV SOLN
1750.0000 mg | INTRAVENOUS | Status: DC
  Administered 2024-11-18: 1750 mg via INTRAVENOUS
  Filled 2024-11-18: qty 350

## 2024-11-18 MED ORDER — ONDANSETRON HCL 4 MG/2ML IJ SOLN: 4.0000 mg | Freq: Four times a day (QID) | INTRAMUSCULAR | Status: AC | PRN

## 2024-11-18 MED ORDER — HYDROMORPHONE HCL 1 MG/ML IJ SOLN
1.0000 mg | INTRAMUSCULAR | Status: AC | PRN
  Administered 2024-11-18 (×3): 1 mg via INTRAVENOUS
  Filled 2024-11-18 (×3): qty 1

## 2024-11-18 MED ORDER — ASPIRIN 81 MG PO TBEC
81.0000 mg | DELAYED_RELEASE_TABLET | Freq: Every day | ORAL | Status: AC
  Administered 2024-11-18 – 2024-11-24 (×7): 81 mg via ORAL
  Filled 2024-11-18 (×7): qty 1

## 2024-11-18 MED ORDER — OXYCODONE HCL 5 MG PO TABS
5.0000 mg | ORAL_TABLET | ORAL | Status: DC | PRN
  Administered 2024-11-18 – 2024-11-20 (×2): 5 mg via ORAL
  Administered 2024-11-20: 10 mg via ORAL
  Administered 2024-11-21: 5 mg via ORAL
  Administered 2024-11-21 (×2): 10 mg via ORAL
  Filled 2024-11-18: qty 2
  Filled 2024-11-18: qty 1
  Filled 2024-11-18: qty 2
  Filled 2024-11-18 (×2): qty 1
  Filled 2024-11-18: qty 2

## 2024-11-18 MED ORDER — HYDROMORPHONE HCL 1 MG/ML IJ SOLN
1.0000 mg | Freq: Once | INTRAMUSCULAR | Status: AC
  Administered 2024-11-18: 1 mg via INTRAVENOUS
  Filled 2024-11-18: qty 1

## 2024-11-18 MED ORDER — ACETAMINOPHEN 650 MG RE SUPP: 650.0000 mg | Freq: Four times a day (QID) | RECTAL | Status: DC | PRN

## 2024-11-18 MED ORDER — TETANUS-DIPHTH-ACELL PERTUSSIS 5-2-15.5 LF-MCG/0.5 IM SUSP
0.5000 mL | Freq: Once | INTRAMUSCULAR | Status: AC
  Administered 2024-11-18: 0.5 mL via INTRAMUSCULAR
  Filled 2024-11-18: qty 0.5

## 2024-11-18 MED ORDER — ACETAMINOPHEN 325 MG PO TABS
650.0000 mg | ORAL_TABLET | Freq: Four times a day (QID) | ORAL | Status: DC | PRN
  Administered 2024-11-21: 650 mg via ORAL
  Filled 2024-11-18: qty 2

## 2024-11-18 MED ORDER — LISINOPRIL 20 MG PO TABS
20.0000 mg | ORAL_TABLET | Freq: Every day | ORAL | Status: AC
  Administered 2024-11-19 – 2024-11-24 (×5): 20 mg via ORAL
  Filled 2024-11-18 (×5): qty 1

## 2024-11-18 MED ORDER — ONDANSETRON HCL 4 MG PO TABS: 4.0000 mg | ORAL_TABLET | Freq: Four times a day (QID) | ORAL | Status: AC | PRN

## 2024-11-18 MED ORDER — PANTOPRAZOLE SODIUM 40 MG PO TBEC
40.0000 mg | DELAYED_RELEASE_TABLET | Freq: Two times a day (BID) | ORAL | Status: AC
  Administered 2024-11-18 – 2024-11-24 (×12): 40 mg via ORAL
  Filled 2024-11-18 (×12): qty 1

## 2024-11-18 MED ORDER — HEPARIN SODIUM (PORCINE) 5000 UNIT/ML IJ SOLN
5000.0000 [IU] | Freq: Three times a day (TID) | INTRAMUSCULAR | Status: DC
  Administered 2024-11-18 – 2024-11-21 (×9): 5000 [IU] via SUBCUTANEOUS
  Filled 2024-11-18 (×11): qty 1

## 2024-11-18 MED ORDER — ATORVASTATIN CALCIUM 80 MG PO TABS
80.0000 mg | ORAL_TABLET | Freq: Every day | ORAL | Status: AC
  Administered 2024-11-18 – 2024-11-24 (×7): 80 mg via ORAL
  Filled 2024-11-18 (×5): qty 1
  Filled 2024-11-18: qty 2
  Filled 2024-11-18: qty 1

## 2024-11-18 MED ORDER — MELATONIN 5 MG PO TABS
5.0000 mg | ORAL_TABLET | Freq: Every evening | ORAL | Status: AC | PRN
  Administered 2024-11-18 – 2024-11-24 (×4): 5 mg via ORAL
  Filled 2024-11-18 (×4): qty 1

## 2024-11-18 NOTE — Progress Notes (Signed)
 Ortho Note  79 year old male who had chronic osteomyelitis with exposed bone who has new acute nondisplaced fracture of that area.  He is currently bleeding from that area.  I would recommend a long-leg splint.  IV antibiotics for open fracture prophylaxis.  There is no indication for acute I&D.  He has seen Dr. Harden in the past I will reach out to him.  Likely the best treatment option would be proceeding with a below-knee amputation.  Formal consult will follow in the next day or so which may be delayed due to the weather.  Franky MYRTIS Light, MD [facility] [phone] (office) [email or web address]

## 2024-11-18 NOTE — ED Triage Notes (Signed)
 Pt bib EMS from home. Pt went outside to go get some beer. Pt fell and hit leg that has osteomyelitis in. Wound on front of shin. 2 grams of ancef  100 fentanyl . A&Ox4 Possible break.

## 2024-11-18 NOTE — H&P (Addendum)
 " History and Physical    Patient: Craig Knapp FMW:[id number] DOB: 03/06/1946 DOA: 11/18/2024 DOS: the patient was seen and examined on 11/18/2024 PCP: [facility] Emil Schanz, MD  Patient coming from: Home  Chief Complaint:  Chief Complaint  Patient presents with   Fall   HPI: Craig Knapp is a 79 y.o. male with medical history significant of hypertension, hyperlipidemia, CAD s/p CABG, GERD, chronic osteomyelitis of right tibia who presents to the emergency department via EMS from home due to a fall.  Patient states that he slipped on ice today and he has been in pain with inability to walk since the fall.  He denies hitting his head, loss of consciousness or extremity weakness.  EMS was activated, fentanyl  was given en route without improvement.  Ancef  was given.  ED course In the emergency department, BP was 165/61, other vital signs are within normal range.  Workup in the ED showed normal CBC and BMP except for WBC of 15.1 and blood glucose of 122. Right tibia and fibula x-ray showed a new slightly comminuted and mildly displaced fracture of the tibia noted at the old fracture site. Orthopedic surgeon (Dr. Kendal) was consulted and recommended admitting patient, provide antibiotics due to chronic history of osteomyelitis and orthopedic team will consult on patient per EDP.  TRH was asked to admit patient.    Review of Systems: As mentioned in the history of present illness. All other systems reviewed and are negative. Past Medical History:  Diagnosis Date   Coronary atherosclerosis of native coronary artery    CABG 2006, EDMUNDS   DJD (degenerative joint disease) `   back and knees   Essential hypertension, benign    History of nuclear stress test    11/08 7 mets, no ischemia, EF 73%   Male hypogonadism    Osteomyelitis (HCC)    R tibia 1972, recur 2005   Other and unspecified hyperlipidemia    Past Surgical History:  Procedure Laterality Date   CORONARY ARTERY BYPASS GRAFT   2006   SPINE SURGERY  01/18/2023   (laminectomy surgery) L3,4,5   Social History:  reports that he quit smoking about 20 years ago. His smoking use included cigarettes. His smokeless tobacco use includes snuff. He reports current alcohol use of about 8.0 standard drinks of alcohol per week. He reports that he does not use drugs.  Allergies[1]  Family History  Problem Relation Age of Onset   Heart disease Mother        CABG   Lung cancer Mother    Alcoholism Father    Heart attack Neg Hx     Prior to Admission medications  Medication Sig Start Date End Date Taking? Authorizing Provider  ALEVE 220 MG tablet Take 220 mg by mouth daily as needed (for back pain).   Yes [provider]  amLODipine  (NORVASC ) 10 MG tablet Take 1 tablet (10 mg total) by mouth daily. 04/11/24  Yes Sagardia, Emil Schanz, MD  ARTIFICIAL TEARS 1 % ophthalmic solution Place 1 drop into both eyes 3 (three) times daily as needed (for dryness).   Yes [provider]  aspirin  81 MG tablet Take 81 mg by mouth at bedtime.   Yes [provider]  atorvastatin  (LIPITOR ) 80 MG tablet Take 1 tablet (80 mg total) by mouth daily. Patient taking differently: Take 80 mg by mouth at bedtime. 04/11/24 11/18/24 Yes Sagardia, Emil Schanz, MD  CLARITIN 10 MG tablet Take 10 mg by mouth daily as needed for  allergies or rhinitis.   Yes [provider]  fluticasone (FLONASE) 50 MCG/ACT nasal spray Place 1 spray into both nostrils 2 (two) times daily as needed for allergies or rhinitis.   Yes [provider]  lisinopril  (ZESTRIL ) 20 MG tablet Take 1 tablet (20 mg total) by mouth 2 (two) times daily. Patient taking differently: Take 20 mg by mouth in the morning and at bedtime. 04/11/24  Yes Sagardia, Emil Schanz, MD  Omega-3 Fatty Acids (FISH OIL) 1000 MG CAPS Take 1,000 mg by mouth 2 (two) times daily after a meal.   Yes [provider]  pantoprazole  (PROTONIX ) 40 MG tablet Take 1 tablet  (40 mg total) by mouth 2 (two) times daily. Patient taking differently: Take 40 mg by mouth 2 (two) times daily before a meal. 10/04/24  Yes Shila Gustav GAILS, MD    Physical Exam: Vitals:   11/18/24 1400 11/18/24 1415 11/18/24 1430 11/18/24 1445  BP: (!) 155/127 (!) 173/68 (!) 180/73 (!) 182/57  Pulse: 63 (!) 58 (!) 57 (!) 58  Resp:    19  Temp:      TempSrc:      SpO2: 97% 91% 94% 97%  Weight:      Height:       General: Elderly male. Awake and alert and oriented x3. Not in any acute distress.  HEENT: NCAT.  PERRLA. EOMI. Sclerae anicteric.  Moist mucosal membranes. Neck: Neck supple without lymphadenopathy. No carotid bruits. No masses palpated.  Cardiovascular: Regular rate with normal S1-S2 sounds. No murmurs, rubs or gallops auscultated. No JVD.  Respiratory: Clear breath sounds.  No accessory muscle use. Abdomen: Soft, nontender, nondistended. Active bowel sounds. No masses or hepatosplenomegaly  Skin: No rashes, lesions, or ulcerations.  Dry, warm to touch. Musculoskeletal:  2+ dorsalis pedis and radial pulses. Good ROM.  No contractures  Psychiatric: Intact judgment and insight.  Mood appropriate to current condition. Neurologic: No focal neurological deficits. Strength is 5/5 x 4.  CN II - XII grossly intact.   Assessment and Plan: Comminuted fracture of right tibia Patient sustained a new fracture at the site of old fracture site and has a history of chronic osteomyelitis Long leg splint placed per orthopedic recommendation Continue IV vancomycin  Continue Dilaudid  as needed for moderate/severe pain Continue fall precaution Orthopedic surgeon (Dr. Kendal) was consulted and recommended admitting patient and orthopedic team will consult on patient per EDP  Open wound of right Leg Open wound noted in mid shin Continue wound care Obesity class I (BMI 34.66) Continue IV vancomycin  Continue wound care consult Orthopedic surgeon already consulted ID team will consult  on patient per EDP  Obesity class I (BMI 34.66) Diet and lifestyle modification  Essential hypertension Continue amlodipine , lisinopril   Mixed hyperlipidemia Continue Lipitor   CAD s/p CABG Continue aspirin , Lipitor   GERD Continue Protonix    Advance Care Planning: Full code  Consults: Orthopedic surgery (Dr. Kendal)  Family Communication: None at bedside  Severity of Illness: The appropriate patient status for this patient is INPATIENT. Inpatient status is judged to be reasonable and necessary in order to provide the required intensity of service to ensure the patient's safety. The patient's presenting symptoms, physical exam findings, and initial radiographic and laboratory data in the context of their chronic comorbidities is felt to place them at high risk for further clinical deterioration. Furthermore, it is not anticipated that the patient will be medically stable for discharge from the hospital within 2 midnights of admission.   * I certify  that at the point of admission it is my clinical judgment that the patient will require inpatient hospital care spanning beyond 2 midnights from the point of admission due to high intensity of service, high risk for further deterioration and high frequency of surveillance required.*  Author: Charl Wellen, DO 11/18/2024 3:35 PM  For on call review [email or web address].      [1]  Allergies Allergen Reactions   Codeine Nausea Only and Other (See Comments)    Made me feel crazy in my head.   "

## 2024-11-18 NOTE — Plan of Care (Signed)
   Problem: Education: Goal: Knowledge of General Education information will improve Description: Including pain rating scale, medication(s)/side effects and non-pharmacologic comfort measures Outcome: Progressing   Problem: Nutrition: Goal: Adequate nutrition will be maintained Outcome: Progressing

## 2024-11-18 NOTE — Progress Notes (Signed)
 Orthopedic Tech Progress Note Patient Details:  Craig Knapp 1946-07-30 [id number]  Ortho Devices Type of Ortho Device: Long leg splint Ortho Device/Splint Location: RLE Ortho Device/Splint Interventions: Ordered, Application, Adjustment   Post Interventions Patient Tolerated: Well  Dayan Kreis OTR/L 11/18/2024, 4:33 PM

## 2024-11-18 NOTE — Progress Notes (Signed)
 Pharmacy Antibiotic Note  Craig Knapp is a 79 y.o. male admitted on 11/18/2024 with wound infection.  Pharmacy has been consulted for Vanco dosing.  ID: Wound infection (new fracture at the site of old fracture site and has a history of chronic osteomyelitis ). Open wound Afebrile, WBC 15.1, Scr 1.12  Vanco 1/31>>  Plan: Vancomycin  1750 mg IV Q 24 hrs. Goal AUC 400-550. Expected AUC: 482 SCr used: 1.12    Height: 5' 8 (172.7 cm) Weight: 103.4 kg (227 lb 15.3 oz) IBW/kg (Calculated) : 68.4  Temp (24hrs), Avg:98 F (36.7 C), Min:98 F (36.7 C), Max:98 F (36.7 C)  Recent Labs  Lab 11/18/24 1511  WBC 15.1*  CREATININE 1.12    Estimated Creatinine Clearance: 64.4 mL/min (by C-G formula based on SCr of 1.12 mg/dL).    Allergies[1]  Myer Bohlman Karoline Marina, PharmD, BCPS Clinical Staff Pharmacist  [facility] 11/18/2024 4:19 PM     [1]  Allergies Allergen Reactions   Codeine Nausea Only and Other (See Comments)    Made me feel crazy in my head.

## 2024-11-19 ENCOUNTER — Encounter (HOSPITAL_COMMUNITY): Payer: Self-pay

## 2024-11-19 LAB — COMPREHENSIVE METABOLIC PANEL WITH GFR
ALT: 27 U/L (ref 0–44)
AST: 41 U/L (ref 15–41)
Albumin: 4.1 g/dL (ref 3.5–5.0)
Alkaline Phosphatase: 62 U/L (ref 38–126)
Anion gap: 14 (ref 5–15)
BUN: 14 mg/dL (ref 8–23)
CO2: 22 mmol/L (ref 22–32)
Calcium: 9.1 mg/dL (ref 8.9–10.3)
Chloride: 97 mmol/L — ABNORMAL LOW (ref 98–111)
Creatinine, Ser: 1.31 mg/dL — ABNORMAL HIGH (ref 0.61–1.24)
GFR, Estimated: 56 mL/min — ABNORMAL LOW
Glucose, Bld: 106 mg/dL — ABNORMAL HIGH (ref 70–99)
Potassium: 4.9 mmol/L (ref 3.5–5.1)
Sodium: 132 mmol/L — ABNORMAL LOW (ref 135–145)
Total Bilirubin: 1.6 mg/dL — ABNORMAL HIGH (ref 0.0–1.2)
Total Protein: 7.4 g/dL (ref 6.5–8.1)

## 2024-11-19 LAB — CBC
HCT: 41.9 % (ref 39.0–52.0)
Hemoglobin: 13.8 g/dL (ref 13.0–17.0)
MCH: 31.9 pg (ref 26.0–34.0)
MCHC: 32.9 g/dL (ref 30.0–36.0)
MCV: 96.8 fL (ref 80.0–100.0)
Platelets: 251 10*3/uL (ref 150–400)
RBC: 4.33 MIL/uL (ref 4.22–5.81)
RDW: 12.5 % (ref 11.5–15.5)
WBC: 17.4 10*3/uL — ABNORMAL HIGH (ref 4.0–10.5)
nRBC: 0 % (ref 0.0–0.2)

## 2024-11-19 LAB — MAGNESIUM: Magnesium: 1.7 mg/dL (ref 1.7–2.4)

## 2024-11-19 LAB — PHOSPHORUS: Phosphorus: 4.6 mg/dL (ref 2.5–4.6)

## 2024-11-19 LAB — GLUCOSE, CAPILLARY: Glucose-Capillary: 89 mg/dL (ref 70–99)

## 2024-11-19 MED ORDER — VANCOMYCIN HCL 1500 MG/300ML IV SOLN
1500.0000 mg | INTRAVENOUS | Status: AC
  Administered 2024-11-19 – 2024-11-23 (×5): 1500 mg via INTRAVENOUS
  Filled 2024-11-19 (×6): qty 300

## 2024-11-19 MED ORDER — HYDROMORPHONE 1 MG/ML IV SOLN
INTRAVENOUS | Status: DC
  Administered 2024-11-19 (×4): 30 mg via INTRAVENOUS
  Administered 2024-11-19: 0.3 mg via INTRAVENOUS
  Administered 2024-11-20 (×2): 30 mg via INTRAVENOUS
  Filled 2024-11-19: qty 30

## 2024-11-19 MED ORDER — DIPHENHYDRAMINE HCL 12.5 MG/5ML PO ELIX: 12.5000 mg | ORAL_SOLUTION | Freq: Four times a day (QID) | ORAL | Status: DC | PRN

## 2024-11-19 MED ORDER — SODIUM CHLORIDE 0.9% FLUSH: 9.0000 mL | INTRAVENOUS | Status: DC | PRN

## 2024-11-19 MED ORDER — NALOXONE HCL 0.4 MG/ML IJ SOLN: 0.4000 mg | INTRAMUSCULAR | Status: DC | PRN

## 2024-11-19 MED ORDER — DIPHENHYDRAMINE HCL 50 MG/ML IJ SOLN: 12.5000 mg | Freq: Four times a day (QID) | INTRAMUSCULAR | Status: DC | PRN

## 2024-11-19 MED ORDER — HYDROMORPHONE HCL 1 MG/ML IJ SOLN
2.0000 mg | Freq: Once | INTRAMUSCULAR | Status: AC
  Administered 2024-11-19: 2 mg via INTRAVENOUS
  Filled 2024-11-19: qty 2

## 2024-11-19 NOTE — Progress Notes (Signed)
 Pharmacy Antibiotic Note  Craig Knapp is a 79 y.o. male admitted on 11/18/2024 with wound infection.  Pharmacy has been consulted for Vanco dosing.  ID: Wound infection (new fracture at the site of old fracture site and has a history of chronic osteomyelitis ). Open wound Afebrile, WBC 17.4 up, Scr 1.31 up. No cultures  Vanco 1/31>>  Plan: Change Vancomycin  1500 mg IV Q 24 hrs. Goal AUC 400-550. Expected AUC: 476 SCr used: 1.31    Height: 5' 8 (172.7 cm) Weight: 103.4 kg (227 lb 15.3 oz) IBW/kg (Calculated) : 68.4  Temp (24hrs), Avg:98.5 F (36.9 C), Min:98 F (36.7 C), Max:99.2 F (37.3 C)  Recent Labs  Lab 11/18/24 1511 11/19/24 0400  WBC 15.1* 17.4*  CREATININE 1.12 1.31*    Estimated Creatinine Clearance: 55 mL/min (A) (by C-G formula based on SCr of 1.31 mg/dL (H)).    Allergies[1]  Chett Taniguchi Karoline Marina, PharmD, BCPS Clinical Staff Pharmacist  Marina Salines [facility] 11/19/2024 9:36 AM      [1]  Allergies Allergen Reactions   Codeine Nausea Only and Other (See Comments)    Made me feel crazy in my head.

## 2024-11-19 NOTE — Progress Notes (Signed)
 " PROGRESS NOTE  Craig Knapp FMW:[id number] DOB: 1946-10-01 DOA: 11/18/2024 PCP: [facility] Emil Schanz, MD   LOS: 1 day   Brief Narrative / Interim history: 79 year old male with HTN, HLD, CAD status post CABG, chronic right tibia osteomyelitis who comes into the ED after a fall on ice.  He has been unable to walk and experienced severe pain.  Imaging found to have a comminuted and displaced tibial fracture at the old fracture site.  Of note, he has been having chronic osteomyelitis but has been off antibiotics for the past 4 to 5 years.  Subjective / 24h Interval events: He is doing well, had to be placed on a Dilaudid  PCA pump last night due to severe pain  Assesement and Plan: Principal problem Right tibial fracture, history of osteomyelitis at the same site -has been placed on antibiotics, continue.  I do not have ready access to his old microbiology to see what was the pathogen with his prior osteomyelitis - Orthopedic surgery consulted, appreciate input.  Discussed with Dr. Kendal, he intern discussed with Dr. Harden and they recommend transfer to [facility] for OR time this Wednesday.  Surgical plans are not clear to me but patient was told by Dr. Harden that if something like this would happen in the future he would probably look at the BKA  Active problems Essential hypertension-continue amlodipine  and lisinopril , blood pressure is stable  CAD, history of CABG-no chest pain, this is stable  Hyperlipidemia-continue statin  Obesity, class I-BMI 34.  He would benefit from weight loss  Scheduled Meds:  amLODipine   10 mg Oral Daily   aspirin  EC  81 mg Oral QHS   atorvastatin   80 mg Oral QHS   heparin   5,000 Units Subcutaneous Q8H   HYDROmorphone    Intravenous Q4H   lisinopril   20 mg Oral Daily   pantoprazole   40 mg Oral BID AC   Continuous Infusions:  vancomycin      PRN Meds:.acetaminophen  **OR** acetaminophen , diphenhydrAMINE  **OR** diphenhydrAMINE , melatonin,  naloxone  **AND** sodium chloride  flush, ondansetron  **OR** ondansetron  (ZOFRAN ) IV, oxyCODONE   Current Outpatient Medications  Medication Instructions   Aleve 220 mg, Oral, Daily PRN   amLODipine  (NORVASC ) 10 mg, Oral, Daily   ARTIFICIAL TEARS 1 % ophthalmic solution 1 drop, Both Eyes, 3 times daily PRN   aspirin  81 mg, Oral, Daily at bedtime   atorvastatin  (LIPITOR ) 80 mg, Oral, Daily   Claritin 10 mg, Oral, Daily PRN   Fish Oil 1,000 mg, 2 times daily after meals   fluticasone (FLONASE) 50 MCG/ACT nasal spray 1 spray, Each Nare, 2 times daily PRN   lisinopril  (ZESTRIL ) 20 mg, Oral, 2 times daily   pantoprazole  (PROTONIX ) 40 mg, Oral, 2 times daily    Diet Orders (From admission, onward)     Start     Ordered   11/18/24 1616  Diet Heart Room service appropriate? Yes; Fluid consistency: Thin  Diet effective now       Question Answer Comment  Room service appropriate? Yes   Fluid consistency: Thin      11/18/24 1616            DVT prophylaxis: heparin  injection 5,000 Units Start: 11/18/24 2200 SCDs Start: 11/18/24 1616   Lab Results  Component Value Date   PLT 251 11/19/2024      Code Status: Full Code  Family Communication: No family at bedside  Status is: Inpatient Remains inpatient appropriate because: Transferring to [facility], signout to Dr. Verdene   Level  of care: Med-Surg  Consultants:  Orthopedic surgery  Objective: Vitals:   11/19/24 0752 11/19/24 0756 11/19/24 0843 11/19/24 1001  BP: 115/62  (!) 142/65 (!) 132/55  Pulse: 67   71  Resp: 14 14    Temp: 98.1 F (36.7 C)   98.2 F (36.8 C)  TempSrc: Oral   Oral  SpO2: 97% 97%  98%  Weight:      Height:        Intake/Output Summary (Last 24 hours) at 11/19/2024 1027 Last data filed at 11/19/2024 0255 Gross per 24 hour  Intake 242.51 ml  Output 900 ml  Net -657.49 ml   Wt Readings from Last 3 Encounters:  11/18/24 103.4 kg  10/25/24 103.4 kg  10/04/24 104.3 kg     Examination:  Constitutional: NAD Eyes: no scleral icterus ENMT: Mucous membranes are moist.  Neck: normal, supple Respiratory: clear to auscultation bilaterally, no wheezing, no crackles. Normal respiratory effort. No accessory muscle use.  Cardiovascular: Regular rate and rhythm, no murmurs / rubs / gallops. No LE edema.  Abdomen: non distended, no tenderness. Bowel sounds positive.  Musculoskeletal: no clubbing / cyanosis.   Data Reviewed: I have independently reviewed following labs and imaging studies   CBC Recent Labs  Lab 11/18/24 1511 11/19/24 0400  WBC 15.1* 17.4*  HGB 14.7 13.8  HCT 43.4 41.9  PLT 244 251  MCV 97.1 96.8  MCH 32.9 31.9  MCHC 33.9 32.9  RDW 12.4 12.5    Recent Labs  Lab 11/18/24 1511 11/19/24 0400  NA 135 132*  K 4.8 4.9  CL 101 97*  CO2 24 22  GLUCOSE 122* 106*  BUN 13 14  CREATININE 1.12 1.31*  CALCIUM  9.2 9.1  AST 36 41  ALT 30 27  ALKPHOS 64 62  BILITOT 0.7 1.6*  ALBUMIN 4.2 4.1  MG  --  1.7    ------------------------------------------------------------------------------------------------------------------ No results for input(s): CHOL, HDL, LDLCALC, TRIG, CHOLHDL, LDLDIRECT in the last 72 hours.  No results found for: HGBA1C ------------------------------------------------------------------------------------------------------------------ No results for input(s): TSH, T4TOTAL, T3FREE, THYROIDAB in the last 72 hours.  Invalid input(s): FREET3  Cardiac Enzymes No results for input(s): CKMB, TROPONINI, MYOGLOBIN in the last 168 hours.  Invalid input(s): CK ------------------------------------------------------------------------------------------------------------------ No results found for: BNP  CBG: No results for input(s): GLUCAP in the last 168 hours.  No results found for this or any previous visit (from the past 240 hours).   Radiology Studies: DG Tibia/Fibula Right  Port Result Date: 11/18/2024 CLINICAL DATA:  Blunt trauma, hit anterior aspect of right lower extremity and has osteomyelitis. EXAM: PORTABLE RIGHT TIBIA AND FIBULA - 2 VIEW COMPARISON:  05/25/2022. FINDINGS: There stable bony deformity of the mid tibia and proximal fibula, compatible with old trauma. A lucency is seen at the mid tibia which is increased in size from 2023. A minimally comminuted and displaced fracture of the tibia is noted at the previous fracture/infection site bandage material is placed anterior to the mid tibia with a associated soft tissue swelling. Degenerative changes are noted at the ankle, midfoot, and hindfoot. Vascular calcifications are seen in the soft tissues. IMPRESSION: Chronic posttraumatic and infectious changes in the mid tibia and fibula. A new slightly comminuted and mildly displaced fracture of the tibia is noted at the old fracture site. Increased lucency is noted in the region of reported osteomyelitis in the mid tibia as compared with exam from 2023. Electronically Signed   By: Leita Birmingham M.D.   On: 11/18/2024 14:25  Nilda Fendt, MD, PhD [facility]  Between 7 am - 7 pm I am available, please contact me via Amion (for emergencies) or Securechat (non urgent messages)  Between 7 pm - 7 am I am not available, please contact night coverage MD/APP via Amion  "

## 2024-11-19 NOTE — Plan of Care (Signed)
  Problem: Education: Goal: Knowledge of General Education information will improve Description: Including pain rating scale, medication(s)/side effects and non-pharmacologic comfort measures Outcome: Progressing   Problem: Health Behavior/Discharge Planning: Goal: Ability to manage health-related needs will improve Outcome: Progressing   Problem: Clinical Measurements: Goal: Ability to maintain clinical measurements within normal limits will improve Outcome: Progressing Goal: Will remain free from infection Outcome: Progressing Goal: Diagnostic test results will improve Outcome: Progressing Goal: Cardiovascular complication will be avoided Outcome: Progressing   Problem: Nutrition: Goal: Adequate nutrition will be maintained Outcome: Progressing   Problem: Coping: Goal: Level of anxiety will decrease Outcome: Progressing   Problem: Elimination: Goal: Will not experience complications related to bowel motility Outcome: Progressing Goal: Will not experience complications related to urinary retention Outcome: Progressing   Problem: Safety: Goal: Ability to remain free from injury will improve Outcome: Progressing   Problem: Skin Integrity: Goal: Risk for impaired skin integrity will decrease Outcome: Progressing

## 2024-11-19 NOTE — Progress Notes (Signed)
 [facility] transporting patient to [facility]. Patient went with his PCA due to unable to tolerate pain without medication.

## 2024-11-19 NOTE — Discharge Instructions (Signed)

## 2024-11-19 NOTE — Progress Notes (Signed)
 Community resources on AVS

## 2024-11-20 DIAGNOSIS — S82101A Unspecified fracture of upper end of right tibia, initial encounter for closed fracture: Secondary | ICD-10-CM

## 2024-11-20 MED ORDER — HYDROMORPHONE HCL 1 MG/ML IJ SOLN: 1.0000 mg | Freq: Four times a day (QID) | INTRAMUSCULAR | Status: DC | PRN

## 2024-11-20 MED ORDER — SODIUM CHLORIDE 0.9 % IV SOLN: INTRAVENOUS | Status: AC

## 2024-11-20 NOTE — Plan of Care (Signed)
" °  Problem: Education: Goal: Knowledge of General Education information will improve Description: Including pain rating scale, medication(s)/side effects and non-pharmacologic comfort measures Outcome: Not Progressing   Problem: Health Behavior/Discharge Planning: Goal: Ability to manage health-related needs will improve Outcome: Not Progressing   Problem: Clinical Measurements: Goal: Ability to maintain clinical measurements within normal limits will improve Outcome: Not Progressing Goal: Will remain free from infection Outcome: Not Progressing Goal: Diagnostic test results will improve Outcome: Not Progressing Goal: Respiratory complications will improve Outcome: Not Progressing Goal: Cardiovascular complication will be avoided Outcome: Not Progressing   Problem: Activity: Goal: Risk for activity intolerance will decrease Outcome: Not Progressing   Problem: Nutrition: Goal: Adequate nutrition will be maintained Outcome: Not Progressing   Problem: Coping: Goal: Level of anxiety will decrease Outcome: Not Progressing   Problem: Pain Managment: Goal: General experience of comfort will improve and/or be controlled Outcome: Not Progressing   "

## 2024-11-20 NOTE — Progress Notes (Signed)
 " PROGRESS NOTE    Craig Knapp  FMW:[id number] DOB: 03-27-1946 DOA: 11/18/2024 PCP: Purcell Emil Schanz, MD   Brief Narrative:  80 year old male with HTN, HLD, CAD status post CABG, chronic right tibia osteomyelitis who presented to ED after a fall on ice. He has been unable to walk and experienced severe pain. Imaging found to have a comminuted and displaced tibial fracture at the old fracture site. Of note, he has been having chronic osteomyelitis but has been off antibiotics for the past 4 to 5 years.   Assessment & Plan:   Principal Problem:   Right tibial fracture  Right tibial fracture, history of osteomyelitis at the same site:- Orthopedic consulted, appreciate input.  Discussed with Dr. Kendal, he intern discussed with Dr. Harden and they recommend transfer to [facility] for OR time this Wednesday.  Awaiting evaluation and further clarification of plans by Dr. Harden.  Patient remains on vancomycin .  He is also on Dilaudid  PCA.  He appears very comfortable.  I do not think she needs PCA.  Discussed with him at length.  Switching him to IV Dilaudid  1 mg and spacing it out to every 6 hours and will continue oral oxycodone  in between as needed.  Defer further management to orthopedics.   Essential hypertension-continue amlodipine  and lisinopril , blood pressure is stable   CAD, history of CABG-no chest pain, this is stable   Hyperlipidemia-continue statin   Obesity, class I-BMI 34.  He would benefit from weight loss  DVT prophylaxis: heparin  injection 5,000 Units Start: 11/18/24 2200 SCDs Start: 11/18/24 1616   Code Status: Full Code  Family Communication:  None present at bedside.  Plan of care discussed with patient in length and he/she verbalized understanding and agreed with it.  Status is: Inpatient Remains inpatient appropriate because: May need surgery, needs evaluation by orthopedics   Estimated body mass index is 34.66 kg/m as calculated from the following:    Height as of this encounter: 5' 8 (1.727 m).   Weight as of this encounter: 103.4 kg.    Nutritional Assessment: Body mass index is 34.66 kg/m.SABRA Seen by dietician.  I agree with the assessment and plan as outlined below: Nutrition Status:        . Skin Assessment: I have examined the patient's skin and I agree with the wound assessment as performed by the wound care RN as outlined below:    Consultants:  Orthopedics  Procedures:  As above  Antimicrobials:  Anti-infectives (From admission, onward)    Start     Dose/Rate Route Frequency Ordered Stop   11/19/24 1700  vancomycin  (VANCOREADY) IVPB 1500 mg/300 mL        1,500 mg 150 mL/hr over 120 Minutes Intravenous Every 24 hours 11/19/24 0936     11/18/24 1700  vancomycin  (VANCOREADY) IVPB 1750 mg/350 mL  Status:  Discontinued        1,750 mg 175 mL/hr over 120 Minutes Intravenous Every 24 hours 11/18/24 1619 11/19/24 0936         Subjective: Patient seen and examined, complains of pain in the right lower extremity however appears extremely comfortable.  No other complaint.  He was reassured that orthopedics is consulted and will see him.  Objective: Vitals:   11/20/24 0017 11/20/24 0400 11/20/24 0512 11/20/24 0721  BP: 110/89  (!) 120/58   Pulse: 88  88   Resp: 20 15 20 20   Temp: 98.6 F (37 C)  98.6 F (37 C)   TempSrc: Oral  Oral   SpO2: 95%  96%   Weight:      Height:        Intake/Output Summary (Last 24 hours) at 11/20/2024 0814 Last data filed at 11/20/2024 0448 Gross per 24 hour  Intake 270.3 ml  Output 1300 ml  Net -1029.7 ml   Filed Weights   11/18/24 1255  Weight: 103.4 kg    Examination:  General exam: Appears calm and comfortable  Respiratory system: Clear to auscultation. Respiratory effort normal. Cardiovascular system: S1 & S2 heard, RRR. No JVD, murmurs, rubs, gallops or clicks. No pedal edema. Gastrointestinal system: Abdomen is nondistended, soft and nontender. No organomegaly or  masses felt. Normal bowel sounds heard. Central nervous system: Alert and oriented. No focal neurological deficits. Extremities: Dressing in the right lower extremity. Psychiatry: Judgement and insight appear normal. Mood & affect appropriate.    Data Reviewed: I have personally reviewed following labs and imaging studies  CBC: Recent Labs  Lab 11/18/24 1511 11/19/24 0400  WBC 15.1* 17.4*  HGB 14.7 13.8  HCT 43.4 41.9  MCV 97.1 96.8  PLT 244 251   Basic Metabolic Panel: Recent Labs  Lab 11/18/24 1511 11/19/24 0400  NA 135 132*  K 4.8 4.9  CL 101 97*  CO2 24 22  GLUCOSE 122* 106*  BUN 13 14  CREATININE 1.12 1.31*  CALCIUM  9.2 9.1  MG  --  1.7  PHOS  --  4.6   GFR: Estimated Creatinine Clearance: 55 mL/min (A) (by C-G formula based on SCr of 1.31 mg/dL (H)). Liver Function Tests: Recent Labs  Lab 11/18/24 1511 11/19/24 0400  AST 36 41  ALT 30 27  ALKPHOS 64 62  BILITOT 0.7 1.6*  PROT 7.0 7.4  ALBUMIN 4.2 4.1   No results for input(s): LIPASE, AMYLASE in the last 168 hours. No results for input(s): AMMONIA in the last 168 hours. Coagulation Profile: No results for input(s): INR, PROTIME in the last 168 hours. Cardiac Enzymes: No results for input(s): CKTOTAL, CKMB, CKMBINDEX, TROPONINI in the last 168 hours. BNP (last 3 results) No results for input(s): PROBNP in the last 8760 hours. HbA1C: No results for input(s): HGBA1C in the last 72 hours. CBG: Recent Labs  Lab 11/19/24 2237  GLUCAP 89   Lipid Profile: No results for input(s): CHOL, HDL, LDLCALC, TRIG, CHOLHDL, LDLDIRECT in the last 72 hours. Thyroid Function Tests: No results for input(s): TSH, T4TOTAL, FREET4, T3FREE, THYROIDAB in the last 72 hours. Anemia Panel: No results for input(s): VITAMINB12, FOLATE, FERRITIN, TIBC, IRON, RETICCTPCT in the last 72 hours. Sepsis Labs: No results for input(s): PROCALCITON, LATICACIDVEN in the  last 168 hours.  No results found for this or any previous visit (from the past 240 hours).   Radiology Studies: DG Tibia/Fibula Right Port Result Date: 11/18/2024 CLINICAL DATA:  Blunt trauma, hit anterior aspect of right lower extremity and has osteomyelitis. EXAM: PORTABLE RIGHT TIBIA AND FIBULA - 2 VIEW COMPARISON:  05/25/2022. FINDINGS: There stable bony deformity of the mid tibia and proximal fibula, compatible with old trauma. A lucency is seen at the mid tibia which is increased in size from 2023. A minimally comminuted and displaced fracture of the tibia is noted at the previous fracture/infection site bandage material is placed anterior to the mid tibia with a associated soft tissue swelling. Degenerative changes are noted at the ankle, midfoot, and hindfoot. Vascular calcifications are seen in the soft tissues. IMPRESSION: Chronic posttraumatic and infectious changes in the mid tibia and fibula.  A new slightly comminuted and mildly displaced fracture of the tibia is noted at the old fracture site. Increased lucency is noted in the region of reported osteomyelitis in the mid tibia as compared with exam from 2023. Electronically Signed   By: Leita Birmingham M.D.   On: 11/18/2024 14:25    Scheduled Meds:  amLODipine   10 mg Oral Daily   aspirin  EC  81 mg Oral QHS   atorvastatin   80 mg Oral QHS   heparin   5,000 Units Subcutaneous Q8H   HYDROmorphone    Intravenous Q4H   lisinopril   20 mg Oral Daily   pantoprazole   40 mg Oral BID AC   Continuous Infusions:  vancomycin  1,500 mg (11/19/24 2101)     LOS: 2 days   Fredia Skeeter, MD [facility]  11/20/2024, 8:14 AM   *Please note that this is a verbal dictation therefore any spelling or grammatical errors are due to the Dragon Medical One system interpretation.  Please page via Amion and do not message via secure chat for urgent patient care matters. Secure chat can be used for non urgent patient care matters.  How to contact the  [facility] Attending or Consulting provider 7A - 7P or covering provider during after hours 7P -7A, for this patient?  Check the care team in [facility] and look for a) attending/consulting [facility] provider listed and b) the [facility] team listed. Page or secure chat 7A-7P. Log into [email or web address] and use [place] [facility]'s universal password to access. If you do not have the password, please contact the hospital operator. Locate the [facility] provider you are looking for under [facility] and page to a number that you can be directly reached. If you still have difficulty reaching the provider, please page the [facility] (Director on Call) for the Hospitalists listed on amion for assistance.  "

## 2024-11-21 DIAGNOSIS — M86461 Chronic osteomyelitis with draining sinus, right tibia and fibula: Secondary | ICD-10-CM

## 2024-11-21 DIAGNOSIS — S82224B Nondisplaced transverse fracture of shaft of right tibia, initial encounter for open fracture type I or II: Secondary | ICD-10-CM | POA: Diagnosis not present

## 2024-11-21 LAB — BASIC METABOLIC PANEL WITH GFR
Anion gap: 13 (ref 5–15)
BUN: 13 mg/dL (ref 8–23)
CO2: 23 mmol/L (ref 22–32)
Calcium: 8.7 mg/dL — ABNORMAL LOW (ref 8.9–10.3)
Chloride: 97 mmol/L — ABNORMAL LOW (ref 98–111)
Creatinine, Ser: 0.89 mg/dL (ref 0.61–1.24)
GFR, Estimated: >60 mL/min
Glucose, Bld: 92 mg/dL (ref 70–99)
Potassium: 4.1 mmol/L (ref 3.5–5.1)
Sodium: 132 mmol/L — ABNORMAL LOW (ref 135–145)

## 2024-11-21 LAB — CBC WITH DIFFERENTIAL/PLATELET
Abs Immature Granulocytes: 0.05 10*3/uL (ref 0.00–0.07)
Basophils Absolute: 0 10*3/uL (ref 0.0–0.1)
Basophils Relative: 0 %
Eosinophils Absolute: 0.1 10*3/uL (ref 0.0–0.5)
Eosinophils Relative: 1 %
HCT: 35.3 % — ABNORMAL LOW (ref 39.0–52.0)
Hemoglobin: 12.4 g/dL — ABNORMAL LOW (ref 13.0–17.0)
Immature Granulocytes: 0 %
Lymphocytes Relative: 6 %
Lymphs Abs: 0.6 10*3/uL — ABNORMAL LOW (ref 0.7–4.0)
MCH: 33.4 pg (ref 26.0–34.0)
MCHC: 35.1 g/dL (ref 30.0–36.0)
MCV: 95.1 fL (ref 80.0–100.0)
Monocytes Absolute: 1.1 10*3/uL — ABNORMAL HIGH (ref 0.1–1.0)
Monocytes Relative: 9 %
Neutro Abs: 9.8 10*3/uL — ABNORMAL HIGH (ref 1.7–7.7)
Neutrophils Relative %: 84 %
Platelets: 177 10*3/uL (ref 150–400)
RBC: 3.71 MIL/uL — ABNORMAL LOW (ref 4.22–5.81)
RDW: 12.4 % (ref 11.5–15.5)
WBC: 11.6 10*3/uL — ABNORMAL HIGH (ref 4.0–10.5)
nRBC: 0 % (ref 0.0–0.2)

## 2024-11-21 MED ORDER — POVIDONE-IODINE 10 % EX SWAB
2.0000 | Freq: Once | CUTANEOUS | Status: AC
  Administered 2024-11-21: 2 via TOPICAL

## 2024-11-21 MED ORDER — HYDROMORPHONE HCL 1 MG/ML IJ SOLN: 0.5000 mg | Freq: Four times a day (QID) | INTRAMUSCULAR | Status: DC | PRN

## 2024-11-21 MED ORDER — CHLORHEXIDINE GLUCONATE 4 % EX SOLN
60.0000 mL | Freq: Once | CUTANEOUS | Status: AC
  Administered 2024-11-21: 4 via TOPICAL
  Filled 2024-11-21: qty 60

## 2024-11-21 NOTE — Plan of Care (Signed)

## 2024-11-22 ENCOUNTER — Inpatient Hospital Stay (HOSPITAL_COMMUNITY): Admitting: Certified Registered Nurse Anesthetist

## 2024-11-22 ENCOUNTER — Encounter (HOSPITAL_COMMUNITY): Payer: Self-pay | Admitting: Internal Medicine

## 2024-11-22 ENCOUNTER — Encounter (HOSPITAL_COMMUNITY): Admission: EM | Payer: Self-pay | Source: Home / Self Care | Attending: Internal Medicine

## 2024-11-22 DIAGNOSIS — S82224B Nondisplaced transverse fracture of shaft of right tibia, initial encounter for open fracture type I or II: Secondary | ICD-10-CM | POA: Diagnosis not present

## 2024-11-22 DIAGNOSIS — M86461 Chronic osteomyelitis with draining sinus, right tibia and fibula: Secondary | ICD-10-CM | POA: Diagnosis not present

## 2024-11-22 LAB — URIC ACID: Uric Acid, Serum: 6.2 mg/dL (ref 3.7–8.6)

## 2024-11-22 MED ORDER — RISAQUAD PO CAPS
1.0000 | ORAL_CAPSULE | Freq: Every day | ORAL | Status: AC
  Administered 2024-11-22 – 2024-11-24 (×3): 1 via ORAL
  Filled 2024-11-22 (×3): qty 1

## 2024-11-22 MED ORDER — ACETAMINOPHEN 500 MG PO TABS
1000.0000 mg | ORAL_TABLET | Freq: Four times a day (QID) | ORAL | Status: AC
  Administered 2024-11-22 – 2024-11-23 (×3): 1000 mg via ORAL
  Filled 2024-11-22 (×4): qty 2

## 2024-11-22 MED ORDER — VANCOMYCIN HCL 500 MG IV SOLR
INTRAVENOUS | Status: AC
  Filled 2024-11-22: qty 10

## 2024-11-22 MED ORDER — ZINC SULFATE 220 (50 ZN) MG PO CAPS
220.0000 mg | ORAL_CAPSULE | Freq: Every day | ORAL | Status: AC
  Administered 2024-11-22 – 2024-11-24 (×3): 220 mg via ORAL
  Filled 2024-11-22 (×3): qty 1

## 2024-11-22 MED ORDER — LACTATED RINGERS IV SOLN: INTRAVENOUS | Status: DC

## 2024-11-22 MED ORDER — LIDOCAINE 2% (20 MG/ML) 5 ML SYRINGE
INTRAMUSCULAR | Status: DC | PRN
  Administered 2024-11-22: 60 mg via INTRAVENOUS

## 2024-11-22 MED ORDER — CEFAZOLIN SODIUM-DEXTROSE 2-4 GM/100ML-% IV SOLN
2.0000 g | INTRAVENOUS | Status: DC
  Filled 2024-11-22: qty 100

## 2024-11-22 MED ORDER — ONDANSETRON HCL 4 MG/2ML IJ SOLN
INTRAMUSCULAR | Status: DC | PRN
  Administered 2024-11-22: 4 mg via INTRAVENOUS

## 2024-11-22 MED ORDER — DICLOFENAC SODIUM 1 % EX GEL
2.0000 g | Freq: Two times a day (BID) | CUTANEOUS | Status: AC | PRN
  Administered 2024-11-23: 2 g via TOPICAL
  Filled 2024-11-22: qty 100

## 2024-11-22 MED ORDER — SODIUM CHLORIDE 0.9 % IV SOLN: INTRAVENOUS | Status: AC

## 2024-11-22 MED ORDER — OXYCODONE HCL 5 MG PO TABS
10.0000 mg | ORAL_TABLET | ORAL | Status: AC | PRN
  Administered 2024-11-23 – 2024-11-24 (×2): 10 mg via ORAL
  Filled 2024-11-22 (×2): qty 2

## 2024-11-22 MED ORDER — FENTANYL CITRATE (PF) 100 MCG/2ML IJ SOLN
INTRAMUSCULAR | Status: AC
  Filled 2024-11-22: qty 2

## 2024-11-22 MED ORDER — DOCUSATE SODIUM 100 MG PO CAPS
100.0000 mg | ORAL_CAPSULE | Freq: Every day | ORAL | Status: AC
  Administered 2024-11-23 – 2024-11-24 (×2): 100 mg via ORAL
  Filled 2024-11-22 (×2): qty 1

## 2024-11-22 MED ORDER — FENTANYL CITRATE (PF) 100 MCG/2ML IJ SOLN
INTRAMUSCULAR | Status: DC | PRN
  Administered 2024-11-22 (×2): 50 ug via INTRAVENOUS

## 2024-11-22 MED ORDER — BUPIVACAINE HCL (PF) 0.25 % IJ SOLN
INTRAMUSCULAR | Status: DC | PRN
  Administered 2024-11-22 (×2): 20 mL via PERINEURAL

## 2024-11-22 MED ORDER — HYDRALAZINE HCL 20 MG/ML IJ SOLN: 5.0000 mg | INTRAMUSCULAR | Status: AC | PRN

## 2024-11-22 MED ORDER — POTASSIUM CHLORIDE CRYS ER 20 MEQ PO TBCR: 40.0000 meq | EXTENDED_RELEASE_TABLET | Freq: Every day | ORAL | Status: AC | PRN

## 2024-11-22 MED ORDER — FENTANYL CITRATE (PF) 50 MCG/ML IJ SOSY: 50.0000 ug | PREFILLED_SYRINGE | Freq: Once | INTRAMUSCULAR | Status: AC

## 2024-11-22 MED ORDER — HYDROMORPHONE HCL 1 MG/ML IJ SOLN: 0.5000 mg | INTRAMUSCULAR | Status: AC | PRN

## 2024-11-22 MED ORDER — METOPROLOL TARTRATE 5 MG/5ML IV SOLN: 2.0000 mg | INTRAVENOUS | Status: AC | PRN

## 2024-11-22 MED ORDER — DEXAMETHASONE SOD PHOSPHATE PF 10 MG/ML IJ SOLN
INTRAMUSCULAR | Status: DC | PRN
  Administered 2024-11-22: 5 mg via INTRAVENOUS

## 2024-11-22 MED ORDER — VANCOMYCIN HCL 1000 MG IV SOLR
INTRAVENOUS | Status: DC | PRN
  Administered 2024-11-22: 1000 mg via TOPICAL

## 2024-11-22 MED ORDER — OXYCODONE HCL 5 MG PO TABS
5.0000 mg | ORAL_TABLET | ORAL | Status: AC | PRN
  Administered 2024-11-22: 5 mg via ORAL
  Administered 2024-11-22: 10 mg via ORAL
  Administered 2024-11-23 – 2024-11-24 (×6): 5 mg via ORAL
  Filled 2024-11-22: qty 2
  Filled 2024-11-22: qty 1
  Filled 2024-11-22 (×2): qty 2
  Filled 2024-11-22 (×4): qty 1
  Filled 2024-11-22: qty 2

## 2024-11-22 MED ORDER — CHLORHEXIDINE GLUCONATE 0.12 % MT SOLN
OROMUCOSAL | Status: AC
  Administered 2024-11-22: 15 mL via OROMUCOSAL
  Filled 2024-11-22: qty 15

## 2024-11-22 MED ORDER — CHLORHEXIDINE GLUCONATE 0.12 % MT SOLN: 15.0000 mL | Freq: Once | OROMUCOSAL | Status: AC

## 2024-11-22 MED ORDER — VANCOMYCIN HCL 1000 MG IV SOLR
INTRAVENOUS | Status: AC
  Filled 2024-11-22: qty 20

## 2024-11-22 MED ORDER — VASHE WOUND IRRIGATION OPTIME
TOPICAL | Status: DC | PRN
  Administered 2024-11-22: 34 [oz_av]

## 2024-11-22 MED ORDER — JUVEN PO PACK
1.0000 | PACK | Freq: Two times a day (BID) | ORAL | Status: AC
  Administered 2024-11-22 – 2024-11-24 (×5): 1 via ORAL
  Filled 2024-11-22 (×5): qty 1

## 2024-11-22 MED ORDER — VITAMIN D (ERGOCALCIFEROL) 1.25 MG (50000 UNIT) PO CAPS
50000.0000 [IU] | ORAL_CAPSULE | Freq: Every day | ORAL | Status: AC
  Administered 2024-11-22: 50000 [IU] via ORAL
  Filled 2024-11-22: qty 1

## 2024-11-22 MED ORDER — VANCOMYCIN HCL 500 MG IV SOLR
INTRAVENOUS | Status: DC | PRN
  Administered 2024-11-22: 500 mg via TOPICAL

## 2024-11-22 MED ORDER — LORAZEPAM 2 MG/ML IJ SOLN: 0.5000 mg | Freq: Four times a day (QID) | INTRAMUSCULAR | Status: AC | PRN

## 2024-11-22 MED ORDER — PROPOFOL 10 MG/ML IV BOLUS
INTRAVENOUS | Status: DC | PRN
  Administered 2024-11-22: 180 mg via INTRAVENOUS

## 2024-11-22 MED ORDER — LABETALOL HCL 5 MG/ML IV SOLN: 10.0000 mg | INTRAVENOUS | Status: AC | PRN

## 2024-11-22 MED ORDER — PHENOL 1.4 % MT LIQD: 1.0000 | OROMUCOSAL | Status: AC | PRN

## 2024-11-22 MED ORDER — FERROUS SULFATE 325 (65 FE) MG PO TABS
325.0000 mg | ORAL_TABLET | Freq: Every day | ORAL | Status: AC
  Administered 2024-11-22 – 2024-11-24 (×3): 325 mg via ORAL
  Filled 2024-11-22 (×3): qty 1

## 2024-11-22 MED ORDER — ORAL CARE MOUTH RINSE: 15.0000 mL | Freq: Once | OROMUCOSAL | Status: AC

## 2024-11-22 MED ORDER — VITAMIN C 500 MG PO TABS
1000.0000 mg | ORAL_TABLET | Freq: Every day | ORAL | Status: AC
  Administered 2024-11-22 – 2024-11-24 (×3): 1000 mg via ORAL
  Filled 2024-11-22 (×3): qty 2

## 2024-11-22 MED ORDER — ACETAMINOPHEN 325 MG PO TABS
325.0000 mg | ORAL_TABLET | Freq: Four times a day (QID) | ORAL | Status: AC | PRN
  Administered 2024-11-23 – 2024-11-24 (×2): 650 mg via ORAL
  Filled 2024-11-22 (×3): qty 2

## 2024-11-22 NOTE — Progress Notes (Signed)
 Orthopedic Tech Progress Note Patient Details:  Craig Knapp 24-Aug-1946 [id number]  Called in order to [employer] for a VIVE PROTOCOL SHRINKER    Patient ID: Craig Knapp, male   DOB: October 30, 1945, 79 y.o.   MRN: [id number]  [name] Pac 11/22/2024, 1:29 PM

## 2024-11-22 NOTE — Plan of Care (Signed)
   Problem: Education: Goal: Knowledge of General Education information will improve Description Including pain rating scale, medication(s)/side effects and non-pharmacologic comfort measures Outcome: Progressing

## 2024-11-22 NOTE — Plan of Care (Signed)
  Problem: Education: Goal: Knowledge of General Education information will improve Description: Including pain rating scale, medication(s)/side effects and non-pharmacologic comfort measures Outcome: Progressing   Problem: Health Behavior/Discharge Planning: Goal: Ability to manage health-related needs will improve Outcome: Progressing   Problem: Clinical Measurements: Goal: Ability to maintain clinical measurements within normal limits will improve Outcome: Progressing Goal: Will remain free from infection Outcome: Progressing Goal: Diagnostic test results will improve Outcome: Progressing Goal: Respiratory complications will improve Outcome: Progressing Goal: Cardiovascular complication will be avoided Outcome: Progressing   Problem: Activity: Goal: Risk for activity intolerance will decrease Outcome: Progressing   Problem: Nutrition: Goal: Adequate nutrition will be maintained Outcome: Progressing   Problem: Coping: Goal: Level of anxiety will decrease Outcome: Progressing   Problem: Elimination: Goal: Will not experience complications related to bowel motility Outcome: Progressing Goal: Will not experience complications related to urinary retention Outcome: Progressing   Problem: Pain Managment: Goal: General experience of comfort will improve and/or be controlled Outcome: Progressing   Problem: Safety: Goal: Ability to remain free from injury will improve Outcome: Progressing   Problem: Skin Integrity: Goal: Risk for impaired skin integrity will decrease Outcome: Progressing   Problem: Education: Goal: Knowledge of the prescribed therapeutic regimen will improve Outcome: Progressing Goal: Ability to verbalize activity precautions or restrictions will improve Outcome: Progressing Goal: Understanding of discharge needs will improve Outcome: Progressing   Problem: Activity: Goal: Ability to perform//tolerate increased activity and mobilize with assistive  devices will improve Outcome: Progressing   Problem: Clinical Measurements: Goal: Postoperative complications will be avoided or minimized Outcome: Progressing   Problem: Self-Care: Goal: Ability to meet self-care needs will improve Outcome: Progressing   Problem: Self-Concept: Goal: Ability to maintain and perform role responsibilities to the fullest extent possible will improve Outcome: Progressing   Problem: Pain Management: Goal: Pain level will decrease with appropriate interventions Outcome: Progressing   Problem: Skin Integrity: Goal: Demonstration of wound healing without infection will improve Outcome: Progressing

## 2024-11-22 NOTE — Anesthesia Procedure Notes (Signed)
 Procedure Name: LMA Insertion Date/Time: 11/22/2024 12:29 PM  Performed by: Lamar Lucie DASEN, CRNAPre-anesthesia Checklist: Patient identified, Emergency Drugs available, Suction available and Patient being monitored Patient Re-evaluated:Patient Re-evaluated prior to induction Oxygen Delivery Method: Circle system utilized Preoxygenation: Pre-oxygenation with 100% oxygen Induction Type: IV induction Ventilation: Mask ventilation without difficulty LMA: LMA with gastric port inserted LMA Size: 5.0 Number of attempts: 1 Placement Confirmation: positive ETCO2, breath sounds checked- equal and bilateral and CO2 detector Tube secured with: Tape Dental Injury: Teeth and Oropharynx as per pre-operative assessment

## 2024-11-22 NOTE — Anesthesia Postprocedure Evaluation (Signed)
"   Anesthesia Post Note  Patient: Craig Knapp  Procedure(s) Performed: RIGHT BELOW KNEE AMPUTATION (Right: Knee)     Patient location during evaluation: PACU Anesthesia Type: General Level of consciousness: awake and alert Pain management: pain level controlled Vital Signs Assessment: post-procedure vital signs reviewed and stable Respiratory status: spontaneous breathing, nonlabored ventilation, respiratory function stable and patient connected to nasal cannula oxygen Cardiovascular status: blood pressure returned to baseline and stable Postop Assessment: no apparent nausea or vomiting Anesthetic complications: no   No notable events documented.  Last Vitals:  Vitals:   11/22/24 1330 11/22/24 1345  BP: (!) 147/66   Pulse: 77 85  Resp: 15 17  Temp:    SpO2: 91% 92%    Last Pain:  Vitals:   11/22/24 1320  TempSrc:   PainSc: 0-No pain                 [name]      "

## 2024-11-22 NOTE — Anesthesia Procedure Notes (Signed)
 Anesthesia Regional Block: Adductor canal block   Pre-Anesthetic Checklist: , timeout performed,  Correct Patient, Correct Site, Correct Laterality,  Correct Procedure, Correct Position, site marked,  Risks and benefits discussed,  Surgical consent,  Pre-op evaluation,  At surgeon's request and post-op pain management  Laterality: Lower and Right  Prep: chloraprep       Needles:  Injection technique: Single-shot  Needle Type: Echogenic Needle     Needle Length: 9cm  Needle Gauge: 21     Additional Needles:   Procedures:,,,, ultrasound used (permanent image in chart),,    Narrative:  Start time: 11/22/2024 12:05 PM End time: 11/22/2024 12:07 PM Injection made incrementally with aspirations every 5 mL.  Performed by: Personally  Anesthesiologist: Boone Fess, MD  Additional Notes: Patient's chart reviewed and they were deemed appropriate candidate for procedure, per surgeon's request. Patient educated about risks, benefits, and alternatives of the block including but not limited to: temporary or permanent nerve damage, bleeding, infection, damage to surround tissues, block failure, local anesthetic toxicity. Patient expressed understanding. A formal time-out was conducted consistent with institution rules.  Monitors were applied, and minimal sedation used (see nursing record). The site was prepped with skin prep and allowed to dry, and sterile gloves were used. A high frequency linear ultrasound probe with probe cover was utilized throughout. Femoral artery visualized at mid-thigh level, local anesthetic injected anterolateral to it, and echogenic block needle trajectory was monitored throughout. Hydrodissection of saphenous nerve visualized and appeared anatomically normal. Aspiration performed every 5ml. Blood vessels were avoided. All injections were performed without resistance and free of blood and paresthesias. The patient tolerated the procedure well.  Injectate: 20ml 0.25%  bupivacaine 

## 2024-11-22 NOTE — Op Note (Signed)
 11/22/2024  1:26 PM  PATIENT:  Craig Knapp    PRE-OPERATIVE DIAGNOSIS: Chronic osteomylitis with draining sinus tract and pathologic fracture of the tibia  POST-OPERATIVE DIAGNOSIS:  Same  PROCEDURE:  RIGHT BELOW KNEE AMPUTATION Application of Kerecis micro graft 38 cm. Application of Prevena Peel and Place wound VAC dressings Application of Vive Wear stump shrinker and the [employer] limb protector Application of 1.5 g vancomycin  powder  SURGEON:  Jerona LULLA Sage, MD  ANESTHESIA:   General  PREOPERATIVE INDICATIONS:  Craig Knapp is a  79 y.o. male with a diagnosis of osteomylitis and pathologic fracture of the tibia who failed conservative measures and elected for surgical management.    The risks benefits and alternatives were discussed with the patient preoperatively including but not limited to the risks of infection, bleeding, nerve injury, cardiopulmonary complications, the need for revision surgery, among others, and the patient was willing to proceed.  OPERATIVE IMPLANTS:   Implant Name Type Inv. Item Serial No. Manufacturer Lot No. LRB No. Used Action  GRAFT SKIN WND MICRO 38 - [id number] Tissue GRAFT SKIN WND MICRO 38  [employer] [id number] Right 1 Implanted     OPERATIVE FINDINGS: Tissue margins were grossly clear.  There was adhesions of the skin and soft tissue to the tibia.  The tibial canal was filled with some of the vancomycin  powder and Kerecis tissue graft.  OPERATIVE PROCEDURE: Patient was brought to the operating room after undergoing a regional anesthetic.  After adequate levels anesthesia were obtained a thigh tourniquet was placed and the lower extremity was prepped using DuraPrep draped into a sterile field. The foot was draped out of the sterile field with impervious stockinette.  A timeout was called and the tourniquet inflated.  A transverse skin incision was made 12 cm distal to the tibial tubercle, the incision curved proximally, and a large posterior  flap was created.  The tibia was transected just proximal to the skin incision and beveled anteriorly.  The fibula was transected just proximal to the tibial incision.  The sciatic nerve was pulled cut and allowed to retract.  The vascular bundles were suture ligated with 2-0 silk.  The tourniquet was deflated and hemostasis obtained.  The wound was irrigated with Vashe.   The Kerecis micro powder 38 cm mixed with 1.5 g vancomycin  powder was applied to the open wound and applied within the femoral canal with a 200 cm surface area.    The deep and superficial fascial layers were closed using #1 Vicryl.  The skin was closed using staples.    The Prevena Peel and Place dressing was applied this was overwrapped with Ioban.  This was connected to the wound VAC pump and had a good suction fit, this was covered with a stump shrinker and a limb protector.  Patient was taken to the PACU in stable condition.   DISCHARGE PLANNING:  Antibiotic duration: 24-hour antibiotics  Weightbearing: Nonweightbearing on the operative extremity  Pain medication: Opioid pathway  Dressing care/ Wound VAC: Continue wound VAC with the Prevena plus pump at discharge for 1 week  Ambulatory devices: Walker or kneeling scooter  Discharge to: Discharge planning based on recommendations per physical therapy  Follow-up: In the office 1 week after discharge.

## 2024-11-22 NOTE — Care Management Important Message (Signed)
 Important Message  Patient Details  Name: Craig Knapp MRN: [id number] Date of Birth: October 01, 1946   Important Message Given:  Yes - Medicare IM     Claretta Deed 11/22/2024, 3:48 PM

## 2024-11-22 NOTE — Progress Notes (Signed)
 " PROGRESS NOTE    Craig Knapp  FMW:[id number] DOB: 11-02-1945 DOA: 11/18/2024 PCP: Purcell Emil Schanz, MD   Brief Narrative:  79 year old male with HTN, HLD, CAD status post CABG, chronic right tibia osteomyelitis who presented to ED after a fall on ice. He has been unable to walk and experienced severe pain. Imaging found to have a comminuted and displaced tibial fracture at the old fracture site. Of note, he has been having chronic osteomyelitis but has been off antibiotics for the past 4 to 5 years.   Assessment & Plan:   Principal Problem:   Right tibial fracture Active Problems:   Chronic osteomyelitis of right tibia with draining sinus (HCC)  Right tibial fracture, history of osteomyelitis at the same site:- Orthopedic consulted, appreciate input.  Discussed with Dr. Kendal, he intern discussed with Dr. Harden and they recommend transfer to [facility] for OR time this Wednesday.  Seen by Dr. Harden, scheduled for right BKA today.  Continue current pain management.    Essential hypertension-continue amlodipine  and lisinopril , blood pressure is stable, holding this morning's dose though.   CAD, history of CABG-no chest pain, this is stable   Hyperlipidemia-continue statin   Obesity, class I-BMI 34.  He would benefit from weight loss  DVT prophylaxis: heparin  injection 5,000 Units Start: 11/18/24 2200 SCDs Start: 11/18/24 1616   Code Status: Full Code  Family Communication:  None present at bedside.  Plan of care discussed with patient in length and he/she verbalized understanding and agreed with it.  Status is: Inpatient Remains inpatient appropriate because: May need surgery, needs evaluation by orthopedics   Estimated body mass index is 34.66 kg/m as calculated from the following:   Height as of this encounter: 5' 8 (1.727 m).   Weight as of this encounter: 103.4 kg.    Nutritional Assessment: Body mass index is 34.66 kg/m.Craig Knapp Seen by dietician.  I agree  with the assessment and plan as outlined below: Nutrition Status:        . Skin Assessment: I have examined the patient's skin and I agree with the wound assessment as performed by the wound care RN as outlined below:    Consultants:  Orthopedics  Procedures:  As above  Antimicrobials:  Anti-infectives (From admission, onward)    Start     Dose/Rate Route Frequency Ordered Stop   11/22/24 0800  ceFAZolin  (ANCEF ) IVPB 2g/100 mL premix        2 g 200 mL/hr over 30 Minutes Intravenous On call to O.R. 11/22/24 0711 11/23/24 0559   11/19/24 1700  [MAR Hold]  vancomycin  (VANCOREADY) IVPB 1500 mg/300 mL        (MAR Hold since Wed 11/22/2024 at 1052.Hold Reason: Transfer to a Procedural area)   1,500 mg 150 mL/hr over 120 Minutes Intravenous Every 24 hours 11/19/24 0936     11/18/24 1700  vancomycin  (VANCOREADY) IVPB 1750 mg/350 mL  Status:  Discontinued        1,750 mg 175 mL/hr over 120 Minutes Intravenous Every 24 hours 11/18/24 1619 11/19/24 0936         Subjective: And examined.  He has no complaints.  He is looking forward to the surgery today.  Objective: Vitals:   11/21/24 1100 11/21/24 2007 11/22/24 0852 11/22/24 1053  BP: 131/64 (!) 129/55 (!) 127/45 (!) 142/59  Pulse:  80 77 79  Resp:  20 18 18   Temp: 98.1 F (36.7 C) 98.9 F (37.2 C) 98.8 F (37.1 C) 97.9 F (  36.6 C)  TempSrc:  Oral Oral Oral  SpO2:  96% 94% 92%  Weight:    103.4 kg  Height:    5' 8 (1.727 m)    Intake/Output Summary (Last 24 hours) at 11/22/2024 1059 Last data filed at 11/22/2024 0800 Gross per 24 hour  Intake --  Output 1600 ml  Net -1600 ml   Filed Weights   11/18/24 1255 11/22/24 1053  Weight: 103.4 kg 103.4 kg    Examination:  General exam: Appears calm and comfortable  Respiratory system: Clear to auscultation. Respiratory effort normal. Cardiovascular system: S1 & S2 heard, RRR. No JVD, murmurs, rubs, gallops or clicks. No pedal edema. Gastrointestinal system: Abdomen is  nondistended, soft and nontender. No organomegaly or masses felt. Normal bowel sounds heard. Central nervous system: Alert and oriented. No focal neurological deficits. Extremities: Dressing in the right lower extremity Psychiatry: Judgement and insight appear normal. Mood & affect appropriate.   Data Reviewed: I have personally reviewed following labs and imaging studies  CBC: Recent Labs  Lab 11/18/24 1511 11/19/24 0400 11/21/24 0456  WBC 15.1* 17.4* 11.6*  NEUTROABS  --   --  9.8*  HGB 14.7 13.8 12.4*  HCT 43.4 41.9 35.3*  MCV 97.1 96.8 95.1  PLT 244 251 177   Basic Metabolic Panel: Recent Labs  Lab 11/18/24 1511 11/19/24 0400 11/21/24 0456  NA 135 132* 132*  K 4.8 4.9 4.1  CL 101 97* 97*  CO2 24 22 23   GLUCOSE 122* 106* 92  BUN 13 14 13   CREATININE 1.12 1.31* 0.89  CALCIUM  9.2 9.1 8.7*  MG  --  1.7  --   PHOS  --  4.6  --    GFR: Estimated Creatinine Clearance: 81 mL/min (by C-G formula based on SCr of 0.89 mg/dL). Liver Function Tests: Recent Labs  Lab 11/18/24 1511 11/19/24 0400  AST 36 41  ALT 30 27  ALKPHOS 64 62  BILITOT 0.7 1.6*  PROT 7.0 7.4  ALBUMIN 4.2 4.1   No results for input(s): LIPASE, AMYLASE in the last 168 hours. No results for input(s): AMMONIA in the last 168 hours. Coagulation Profile: No results for input(s): INR, PROTIME in the last 168 hours. Cardiac Enzymes: No results for input(s): CKTOTAL, CKMB, CKMBINDEX, TROPONINI in the last 168 hours. BNP (last 3 results) No results for input(s): PROBNP in the last 8760 hours. HbA1C: No results for input(s): HGBA1C in the last 72 hours. CBG: Recent Labs  Lab 11/19/24 2237  GLUCAP 89   Lipid Profile: No results for input(s): CHOL, HDL, LDLCALC, TRIG, CHOLHDL, LDLDIRECT in the last 72 hours. Thyroid Function Tests: No results for input(s): TSH, T4TOTAL, FREET4, T3FREE, THYROIDAB in the last 72 hours. Anemia Panel: No results for  input(s): VITAMINB12, FOLATE, FERRITIN, TIBC, IRON, RETICCTPCT in the last 72 hours. Sepsis Labs: No results for input(s): PROCALCITON, LATICACIDVEN in the last 168 hours.  No results found for this or any previous visit (from the past 240 hours).   Radiology Studies: No results found.   Scheduled Meds:  [MAR Hold] amLODipine   10 mg Oral Daily   [MAR Hold] aspirin  EC  81 mg Oral QHS   [MAR Hold] atorvastatin   80 mg Oral QHS   [MAR Hold] heparin   5,000 Units Subcutaneous Q8H   [MAR Hold] lisinopril   20 mg Oral Daily   [MAR Hold] pantoprazole   40 mg Oral BID AC   Continuous Infusions:   ceFAZolin  (ANCEF ) IV     [MAR Hold] vancomycin   1,500 mg (11/21/24 1758)     LOS: 4 days   Fredia Skeeter, MD [facility]  11/22/2024, 10:59 AM   *Please note that this is a verbal dictation therefore any spelling or grammatical errors are due to the Dragon Medical One system interpretation.  Please page via Amion and do not message via secure chat for urgent patient care matters. Secure chat can be used for non urgent patient care matters.  How to contact the [facility] Attending or Consulting provider 7A - 7P or covering provider during after hours 7P -7A, for this patient?  Check the care team in [facility] and look for a) attending/consulting [facility] provider listed and b) the [facility] team listed. Page or secure chat 7A-7P. Log into [email or web address] and use [facility] universal password to access. If you do not have the password, please contact the hospital operator. Locate the [facility] provider you are looking for under [facility] and page to a number that you can be directly reached. If you still have difficulty reaching the provider, please page the [facility] (Director on Call) for the Hospitalists listed on amion for assistance.  "

## 2024-11-22 NOTE — Transfer of Care (Signed)
 Immediate Anesthesia Transfer of Care Note  Patient: Craig Knapp  Procedure(s) Performed: RIGHT BELOW KNEE AMPUTATION (Right: Knee)  Patient Location: PACU  Anesthesia Type:General and Regional  Level of Consciousness: drowsy and patient cooperative  Airway & Oxygen Therapy: Patient Spontanous Breathing  Post-op Assessment: Report given to RN, Post -op Vital signs reviewed and stable, and Patient moving all extremities X 4  Post vital signs: Reviewed and stable  Last Vitals:  Vitals Value Taken Time  BP 128/63 11/22/24 13:20  Temp 37.6 C 11/22/24 13:20  Pulse 88 11/22/24 13:20  Resp 17 11/22/24 13:20  SpO2 90 % 11/22/24 13:20  Vitals shown include unfiled device data.  Last Pain:  Vitals:   11/22/24 1100  TempSrc:   PainSc: 4       Patients Stated Pain Goal: 1 (11/22/24 1100)  Complications: No notable events documented.

## 2024-11-22 NOTE — Anesthesia Preprocedure Evaluation (Signed)
"                                    Anesthesia Evaluation  Patient identified by MRN, date of birth, ID band Patient awake    Reviewed: Allergy & Precautions, NPO status , Patient's Chart, lab work & pertinent test results  History of Anesthesia Complications Negative for: history of anesthetic complications  Airway Mallampati: III  TM Distance: >3 FB Neck ROM: Full    Dental  (+) Edentulous Upper, Edentulous Lower   Pulmonary neg pulmonary ROS, neg sleep apnea, neg COPD, Patient abstained from smoking.Not current smoker, former smoker   Pulmonary exam normal breath sounds clear to auscultation       Cardiovascular Exercise Tolerance: Good METShypertension, Pt. on medications + CAD and + CABG  (-) Past MI (-) dysrhythmias  Rhythm:Regular Rate:Normal - Systolic murmurs    Neuro/Psych negative neurological ROS  negative psych ROS   GI/Hepatic ,neg GERD  ,,(+)     (-) substance abuse    Endo/Other  neg diabetes    Renal/GU negative Renal ROS     Musculoskeletal   Abdominal   Peds  Hematology   Anesthesia Other Findings Past Medical History: No date: Coronary atherosclerosis of native coronary artery     Comment:  CABG 2006, EDMUNDS `: DJD (degenerative joint disease)     Comment:  back and knees No date: Essential hypertension, benign No date: History of nuclear stress test     Comment:  11/08 7 mets, no ischemia, EF 73% No date: Male hypogonadism No date: Osteomyelitis (HCC)     Comment:  R tibia 1972, recur 2005 No date: Other and unspecified hyperlipidemia  Reproductive/Obstetrics                              Anesthesia Physical Anesthesia Plan  ASA: 3  Anesthesia Plan: General   Post-op Pain Management: Regional block* and Ofirmev  IV (intra-op)*   Induction: Intravenous  PONV Risk Score and Plan: 2 and Ondansetron , Dexamethasone  and Treatment may vary due to age or medical condition  Airway Management  Planned: LMA  Additional Equipment: None  Intra-op Plan:   Post-operative Plan: Extubation in OR  Informed Consent: I have reviewed the patients History and Physical, chart, labs and discussed the procedure including the risks, benefits and alternatives for the proposed anesthesia with the patient or authorized representative who has indicated his/her understanding and acceptance.     Dental advisory given  Plan Discussed with: CRNA and Surgeon  Anesthesia Plan Comments: (Discussed risks of anesthesia with patient, including PONV, sore throat, lip/dental/eye damage. Rare risks discussed as well, such as cardiorespiratory and neurological sequelae, and allergic reactions. Discussed the role of CRNA in patient's perioperative care. Patient understands.  Discussed r/b/a of adductor canal and popliteal nerve block, including:  - bleeding, infection, nerve damage - poor or non functioning block. - reactions and toxicity to local anesthetic Patient understands. )        Anesthesia Quick Evaluation  "

## 2024-11-22 NOTE — Anesthesia Procedure Notes (Addendum)
 Anesthesia Regional Block: Popliteal block   Pre-Anesthetic Checklist: , timeout performed,  Correct Patient, Correct Site, Correct Laterality,  Correct Procedure, Correct Position, site marked,  Risks and benefits discussed,  Surgical consent,  Pre-op evaluation,  At surgeon's request and post-op pain management  Laterality: Lower and Right  Prep: chloraprep       Needles:  Injection technique: Single-shot  Needle Type: Echogenic Needle     Needle Length: 9cm  Needle Gauge: 21     Additional Needles:   Procedures:,,,, ultrasound used (permanent image in chart),,    Narrative:  Start time: 11/22/2024 12:01 PM End time: 11/22/2024 12:04 PM Injection made incrementally with aspirations every 5 mL.  Performed by: Personally  Anesthesiologist: Boone Fess, MD  Additional Notes: Patient's chart reviewed and they were deemed appropriate candidate for procedure, at surgeon's request. Patient educated about risks, benefits, and alternatives of the block including but not limited to: temporary or permanent nerve damage, bleeding, infection, damage to surround tissues, block failure, local anesthetic toxicity. Patient expressed understanding. A formal time-out was conducted consistent with institution rules.  Monitors were applied, and minimal sedation used. The site was prepped with skin prep and allowed to dry, and sterile gloves were used. A high frequency linear ultrasound probe with probe cover was utilized throughout. Popliteal artery pulsatile and visualized in popliteal fossa along with adjacent sciatic nerve and its branch point, which appeared anatomically normal, local anesthetic injected around them just proximal to the branch point, and echogenic block needle trajectory was monitored throughout. Aspiration performed every 5ml. Blood vessels were avoided. All injections were performed without resistance and free of blood and paresthesias. The patient tolerated the procedure  well.  Injectate: 20ml 0.25% bupivacaine 

## 2024-11-22 NOTE — Interval H&P Note (Signed)
 History and Physical Interval Note:  11/22/2024 6:55 AM  Craig Knapp  has presented today for surgery, with the diagnosis of osteomylitis and pathologic fracture of the tibia.  The various methods of treatment have been discussed with the patient and family. After consideration of risks, benefits and other options for treatment, the patient has consented to  Procedures with comments: AMPUTATION BELOW KNEE (Right) - Right below the knee amputation as a surgical intervention.  The patient's history has been reviewed, patient examined, no change in status, stable for surgery.  I have reviewed the patient's chart and labs.  Questions were answered to the patient's satisfaction.     [name] V Raine Blodgett

## 2024-11-23 ENCOUNTER — Encounter (HOSPITAL_COMMUNITY): Payer: Self-pay | Admitting: Orthopedic Surgery

## 2024-11-23 LAB — BASIC METABOLIC PANEL WITH GFR
Anion gap: 12 (ref 5–15)
BUN: 23 mg/dL (ref 8–23)
CO2: 22 mmol/L (ref 22–32)
Calcium: 9 mg/dL (ref 8.9–10.3)
Chloride: 97 mmol/L — ABNORMAL LOW (ref 98–111)
Creatinine, Ser: 0.94 mg/dL (ref 0.61–1.24)
GFR, Estimated: >60 mL/min
Glucose, Bld: 148 mg/dL — ABNORMAL HIGH (ref 70–99)
Potassium: 4.5 mmol/L (ref 3.5–5.1)
Sodium: 131 mmol/L — ABNORMAL LOW (ref 135–145)

## 2024-11-23 MED ORDER — COLCHICINE 0.6 MG PO TABS
0.6000 mg | ORAL_TABLET | Freq: Every day | ORAL | Status: AC
  Administered 2024-11-23 – 2024-11-24 (×2): 0.6 mg via ORAL
  Filled 2024-11-23 (×2): qty 1

## 2024-11-23 NOTE — PMR Pre-admission (Shared)
 PMR Admission Coordinator Pre-Admission Assessment  Patient: Craig Knapp is an 79 y.o., male MRN: [id number] DOB: 1946/09/14 Height: 5' 8 (172.7 cm) Weight: 103.4 kg  Insurance Information HMO: ***    PPO: ***     PCP: ***     IPA: ***     80/20: ***     OTHER: *** PRIMARY: ***      Policy#: ***      Subscriber: *** CM Name: ***      Phone#: ***     Fax#: *** Pre-Cert#: ***      Employer: *** Benefits:  Phone #: ***     Name: *** Eff. Date: ***     Deduct: ***      Out of Pocket Max: ***      Life Max: *** CIR: ***      SNF: *** Outpatient: ***     Co-Pay: *** Home Health: ***      Co-Pay: *** DME: ***     Co-Pay: *** Providers: ***  SECONDARY: none      Policy#:      Phone#:   Financial Counselor:       Phone#:   [employer] for patients in Inpatient Rehabilitation Facilities with attached Privacy Act Statement-Health Care Records was provided and verbally reviewed with: Patient  Emergency Contact Information Contact Information     Name Relation Home Work Mobile   Craig Knapp Significant other [phone]     Craig Knapp Daughter [phone]        Other Contacts   None on File     Current Medical History  Patient Admitting Diagnosis: BKA  History of Present Illness: Presented 11/18/24 to [facility]....transferred to [facility] on 2/1...    Patient's medical record from [facility] and [facility] has been reviewed by the rehabilitation admission coordinator and physician.  Past Medical History  Past Medical History:  Diagnosis Date   Coronary atherosclerosis of native coronary artery    CABG 2006, Craig Knapp   DJD (degenerative joint disease) `   back and knees   Essential hypertension, benign    History of nuclear stress test    11/08 7 mets, no ischemia, EF 73%   Male hypogonadism    Osteomyelitis (HCC)    R tibia 1972, recur 2005   Other and unspecified hyperlipidemia    Has the patient had major  surgery during 100 days prior to admission? Yes  Family History   family history includes Alcoholism in his father; Heart disease in his mother; Lung cancer in his mother.  Current Medications Current Medications[1]  Patients Current Diet:  Diet Order             Diet Heart Room service appropriate? Yes; Fluid consistency: Thin  Diet effective now                   Precautions / Restrictions Precautions Precautions: Fall Precaution/Restrictions Comments: RLE Wound Vac Other Brace: RLE Limb Guard Restrictions Weight Bearing Restrictions Per Provider Order: Yes RLE Weight Bearing Per Provider Order: Non weight bearing   Has the patient had 2 or more falls or a fall with injury in the past year? Yes  Prior Activity Level Community (5-7x/wk): independent without AD, drives  Prior Functional Level Self Care: Did the patient need help bathing, dressing, using the toilet or eating? Independent  Indoor Mobility: Did the patient need assistance with walking from room to room (with or without device)?  Independent  Stairs: Did the patient need assistance with internal or external stairs (with or without device)? Independent  Functional Cognition: Did the patient need help planning regular tasks such as shopping or remembering to take medications? Independent  Patient Information Are you of Hispanic, Latino/a,or Spanish origin?: A. No, not of Hispanic, Latino/a, or Spanish origin What is your race?: A. White Do you need or want an interpreter to communicate with a doctor or health care staff?: 0. No  Patient's Response To:  Health Literacy and Transportation Is the patient able to respond to health literacy and transportation needs?: Yes Health Literacy - How often do you need to have someone help you when you read instructions, pamphlets, or other written material from your doctor or pharmacy?: Never In the past 12 months, has lack of transportation kept you from medical  appointments or from getting medications?: No In the past 12 months, has lack of transportation kept you from meetings, work, or from getting things needed for daily living?: No  Home Assistive Devices / Equipment Home Equipment: Agricultural Consultant (2 wheels), Tub bench  Prior Device Use: Indicate devices/aids used by the patient prior to current illness, exacerbation or injury? None of the above  Current Functional Level Cognition  Orientation Level: Oriented X4    Extremity Assessment (includes Sensation/Coordination)  Upper Extremity Assessment: Defer to OT evaluation  Lower Extremity Assessment: RLE deficits/detail RLE Deficits / Details: Pt POD 1 s/p BKA. Decreased hip and knee AROM. Decreased strength, grossly 2+/5. RLE: Unable to fully assess due to pain RLE Coordination: decreased gross motor    ADLs  Overall ADL's : Needs assistance/impaired Eating/Feeding: Set up, Sitting Grooming: Wash/dry hands, Wash/dry face, Set up, Sitting Upper Body Bathing: Set up, Sitting Lower Body Bathing: Moderate assistance, Sitting/lateral leans Upper Body Dressing : Set up, Sitting Lower Body Dressing: Maximal assistance, Sit to/from stand Toilet Transfer: Moderate assistance, +2 for physical assistance, Rolling walker (2 wheels), Stand-pivot, BSC/3in1 Toileting- Clothing Manipulation and Hygiene: Maximal assistance, Sit to/from stand    Mobility  Overal bed mobility: Needs Assistance Bed Mobility: Supine to Sit Supine to sit: Supervision, HOB elevated, Used rails    Transfers  Overall transfer level: Needs assistance Equipment used: Rolling walker (2 wheels) Transfers: Sit to/from Stand, Bed to chair/wheelchair/BSC Sit to Stand: Mod assist, +2 physical assistance, Min assist Bed to/from chair/wheelchair/BSC transfer type:: Step pivot, Stand pivot Stand pivot transfers: Mod assist, +2 physical assistance Step pivot transfers: Mod assist, +2 physical assistance General transfer comment:  Pt stood from lowest bed height. Cued proper hand placement using RW. He pushed up with RUE from bed and LUE on walker. Increased time to achieve upright posture. Transferred to recliner chair on left. Pt initially taking hops, but fatiguing and resorting to pivoting on left foot to reach chair.    Ambulation / Gait / Stairs / Wheelchair Mobility  Ambulation/Gait Ambulation/Gait assistance: Mod assist, +2 physical assistance Gait Distance (Feet): 3 Feet Assistive device: Rolling walker (2 wheels) Gait Pattern/deviations: Step-to pattern General Gait Details: Pt utilized a hopping technique with heavy reliance on BUE support to advance LLE. He maintained RLE NWB at all times. Pt quickly fatigued and was unable to achieve foot clearance, pivoting on sole of foot.    Posture / Balance Dynamic Sitting Balance Sitting balance - Comments: Sat EOB with close supervision. He was able to reach multi-directions without LOB. Balance Overall balance assessment: Needs assistance Sitting-balance support: Feet supported, Bilateral upper extremity supported Sitting balance-Leahy Scale: Fair Sitting  balance - Comments: Sat EOB with close supervision. He was able to reach multi-directions without LOB. Standing balance support: Bilateral upper extremity supported, During functional activity, Reliant on assistive device for balance Standing balance-Leahy Scale: Poor Standing balance comment: Pt dependent on RW and external support of therapists    Special considerations/life events  Wound VAC to surgical incision   Previous Home Environment  Living Arrangements: Spouse/significant other Available Help at Discharge: Family, Available 24 hours/day (Wife 24/7, can be supportive, but unable to physically assist.) Type of Home: Other(Comment) (Condo) Home Layout: Two level, 1/2 bath on main level, Bed/bath upstairs Alternate Level Stairs-Number of Steps: 14 Home Access: Stairs to enter Entrance Stairs-Rails:  Right Entrance Stairs-Number of Steps: 10 (7+landing+3; pt reports could drive to the back, where there is a level entry to the patio and entrance through sliding glass door) Bathroom Shower/Tub: Engineer, Manufacturing Systems: Handicapped height Bathroom Accessibility: Yes How Accessible: Accessible via walker Home Care Services: No Additional Comments: can not get ramp to front entry. Will use back entry  Discharge Living Setting Plans for Discharge Living Setting: Patient's home, Other (Comment), Lives with (comment) (condo with wife) Type of Home at Discharge: Other (Comment) (condo) Discharge Home Layout: Two level, 1/2 bath on main level Alternate Level Stairs-Number of Steps: 14 steps Discharge Home Access: Stairs to enter (10, but will use back level entry) Entrance Stairs-Number of Steps: 10 steps in front which he can not have ramp placed there. will take rear entry Discharge Bathroom Shower/Tub: Tub/shower unit Discharge Bathroom Toilet: Handicapped height Discharge Bathroom Accessibility: Yes How Accessible: Accessible via walker Does the patient have any problems obtaining your medications?: No  Social/Family/Support Systems Patient Roles: Spouse Contact Information: wife Anticipated Caregiver: wife Anticipated Caregiver's Contact Information: see contacts Ability/Limitations of Caregiver: wife can provide supervision, light min assist Caregiver Availability: 24/7 Discharge Plan Discussed with Primary Caregiver: Yes Is Caregiver In Agreement with Plan?: Yes Does Caregiver/Family have Issues with Lodging/Transportation while Pt is in Rehab?: No  Goals Patient/Family Goal for Rehab: Mod I to supervision with PT and OT Expected length of stay: ELOS 7 to 10 days Additional Information: 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 I met with patient at the bedside for inpatient rehabilitation assessment.  Patient will benefit from ongoing PT and OT, can actively participate in 3 hours of therapy a day 5 days of the week, and can make measurable gains during the admission.  Patient will also benefit from the coordinated team approach during an Inpatient Acute Rehabilitation admission.  The patient will receive intensive therapy as well as Rehabilitation physician, nursing, social worker, and care management interventions.  Due to bladder management, bowel management, safety, skin/wound care, disease management, medication administration, pain management, and patient education the patient requires 24 hour a day rehabilitation nursing.  The patient is currently *** with mobility and basic ADLs.  Discharge setting and therapy post discharge at home with home health is anticipated.  Patient has agreed to participate in the Acute Inpatient Rehabilitation Program and will admit {Time; today/tomorrow:10263}.  Preadmission Screen  Completed By:  Alison Heron Lot,  RN MSN 11/23/2024 8:10 PM ______________________________________________________________________   Discussed status with Dr. PIERRETTE on *** at *** and received approval for admission today.  Admission Coordinator:  Alison Heron Lot, RN MSN time PIERRETTEPattricia ***   Assessment/Plan: Diagnosis: *** Does the need for close, 24 hr/day Medical supervision in concert with the patient's rehab needs make it unreasonable for this patient to be served in a less intensive setting? {yes_no_potentially:3041433} Co-Morbidities requiring supervision/potential complications:  *** Due to {due un:6958565}, does the patient require 24 hr/day rehab nursing? {yes_no_potentially:3041433} Does the patient require coordinated care of a physician, rehab nurse, PT, OT, and SLP to address physical and functional deficits in the context of the above medical diagnosis(es)? {yes_no_potentially:3041433} Addressing deficits in the following areas: {deficits:3041436} Can the patient actively participate in an intensive therapy program of at least 3 hrs of therapy 5 days a week? {yes_no_potentially:3041433} The potential for patient to make measurable gains while on inpatient rehab is {potential:3041437} Anticipated functional outcomes upon discharge from inpatient rehab: {functional outcomes:304600100} PT, {functional outcomes:304600100} OT, {functional outcomes:304600100} SLP Estimated rehab length of stay to reach the above functional goals is: *** Anticipated discharge destination: {anticipated dc setting:21604} 10. Overall Rehab/Functional Prognosis: {potential:3041437}   MD Signature: ***     [1]  Current Facility-Administered Medications:    acetaminophen  (TYLENOL ) tablet 325-650 mg, 325-650 mg, Oral, Q6H PRN, Gerome, Emma M, PA-C   acidophilus (RISAQUAD) capsule 1 capsule, 1 capsule, Oral, Daily, Collins, Emma M, PA-C, 1 capsule at 11/23/24 0820   amLODipine  (NORVASC ) tablet 10 mg, 10 mg, Oral, Daily, Gerome Herring M, PA-C, 10 mg at 11/23/24 9180   ascorbic acid  (VITAMIN C ) tablet 1,000 mg, 1,000 mg, Oral, Daily, Gerome Herring M, PA-C, 1,000 mg at 11/23/24 9180   aspirin  EC tablet 81 mg, 81 mg, Oral, QHS, Gerome Herring HERO, PA-C, 81 mg at 11/22/24 2117   atorvastatin  (LIPITOR ) tablet 80 mg, 80 mg, Oral, QHS, Gerome Herring HERO, PA-C, 80 mg at 11/22/24 2117   colchicine  tablet 0.6 mg, 0.6 mg, Oral, Daily, Duda, Marcus V, MD, 0.6 mg at 11/23/24 9181   diclofenac  Sodium (VOLTAREN ) 1 % topical gel 2 g, 2 g, Topical, BID PRN, Gerome Herring HERO, PA-C, 2 g at 11/23/24 9182   docusate  sodium (COLACE) capsule 100 mg, 100 mg, Oral, Daily, Gerome Herring M, PA-C, 100 mg at 11/23/24 9181   ferrous sulfate  tablet 325 mg, 325 mg, Oral, Daily, Gerome Herring M, PA-C, 325 mg at 11/23/24 9181   hydrALAZINE  (APRESOLINE ) injection 5 mg, 5 mg, Intravenous, Q20 Min PRN, Gerome, Emma M, PA-C   HYDROmorphone  (DILAUDID ) injection 0.5-1 mg, 0.5-1 mg, Intravenous, Q4H PRN, Gerome, Emma M, PA-C   labetalol  (NORMODYNE ) injection 10 mg, 10 mg, Intravenous, Q10 min PRN, Gerome, Emma M, PA-C   lisinopril  (ZESTRIL ) tablet 20 mg, 20 mg, Oral, Daily, Gerome Herring M, PA-C, 20 mg at 11/23/24 9180   LORazepam  (ATIVAN ) injection 0.5 mg, 0.5 mg, Intravenous, Q6H PRN, Gerome, Emma M, PA-C   melatonin tablet 5 mg, 5 mg, Oral, QHS PRN, Gerome Herring M, PA-C, 5 mg at 11/22/24 2108   metoprolol  tartrate (LOPRESSOR ) injection 2-5 mg, 2-5 mg, Intravenous, Q2H PRN, Gerome, Emma M, PA-C   nutrition supplement (JUVEN) (JUVEN) powder packet 1 packet, 1 packet, Oral, BID BM, Gerome Herring HERO, PA-C, 1 packet at 11/23/24 1429   ondansetron  (ZOFRAN ) tablet 4 mg, 4 mg, Oral, Q6H PRN **OR** ondansetron  (ZOFRAN ) injection 4 mg, 4 mg, Intravenous, Q6H PRN,  Gerome Herring M, PA-C   oxyCODONE  (Oxy IR/ROXICODONE ) immediate release tablet 10-15 mg, 10-15 mg, Oral, Q4H PRN, Gerome, Emma M, PA-C   oxyCODONE  (Oxy IR/ROXICODONE ) immediate release tablet 5-10 mg, 5-10 mg, Oral, Q4H PRN, Collins, Emma M, PA-C, 5 mg at 11/23/24 1730   pantoprazole  (PROTONIX ) EC tablet 40 mg, 40 mg, Oral, BID AC, Collins, Emma M, PA-C, 40 mg at 11/23/24 1730   phenol (CHLORASEPTIC) mouth spray 1 spray, 1 spray, Mouth/Throat, PRN, Gerome, Emma M, PA-C   potassium chloride  SA (KLOR-CON  M) CR tablet 40-60 mEq, 40-60 mEq, Oral, Daily PRN, Gerome, Emma M, PA-C   zinc  sulfate (50mg  elemental zinc ) capsule 220 mg, 220 mg, Oral, Daily, Gerome Herring M, PA-C, 220 mg at 11/23/24 [phone]

## 2024-11-23 NOTE — Evaluation (Signed)
 Physical Therapy Evaluation Patient Details Name: Craig Knapp MRN: [id number] DOB: 08-10-1946 Today's Date: 11/23/2024  History of Present Illness  Pt is a 79 y.o. M adm 11/18/24 for chronic osteomylitis with draining sinus tract and pathologic fracture of the tibia who failed conservative measures and elected for surgical management. Pt s/p R BKA 2/4. PMHx: CAD, CABG, HTN, and DJD.   Clinical Impression  Pt admitted with above diagnosis. PTA, pt was independent with functional mobility, ADLs/IADLs, and driving. He lives with his wife in a two story condo with 10 STE or a level entry at the back porch if they jump the curb using a vehicle. Pt currently with functional limitations due to the deficits listed below (see PT Problem List). He performed bed mobility with supervision and required modA x2 for transfers using RW. Pt is currently limited by pain, weight bearing status (RLE NWB), impaired balance, and decreased activity tolerance. Educated pt on R BKA HEP (Medbridge Access Code: [email or web address]). He performed a couple of reps of the various exercises to demonstrate understanding. Pt is highly motivated to participate in therapy and maximize functional mobility as well as independence. Pt will benefit from acute skilled PT to increase his independence and safety with mobility to allow discharge. Recommend intensive inpatient follow-up therapy, >3 hours/day.    If plan is discharge home, recommend the following: A lot of help with walking and/or transfers;A lot of help with bathing/dressing/bathroom;Assistance with cooking/housework;Assist for transportation;Help with stairs or ramp for entrance   Can travel by private vehicle        Equipment Recommendations Wheelchair (measurements PT);Wheelchair cushion (measurements PT);BSC/3in1  Recommendations for Other Services  Rehab consult    Functional Status Assessment Patient has had a recent decline in their functional status and demonstrates the  ability to make significant improvements in function in a reasonable and predictable amount of time.     Precautions / Restrictions Precautions Precautions: Fall Recall of Precautions/Restrictions: Impaired Precaution/Restrictions Comments: RLE Wound Vac Required Braces or Orthoses: Other Brace Other Brace: RLE Limb Guard Restrictions Weight Bearing Restrictions Per Provider Order: Yes RLE Weight Bearing Per Provider Order: Non weight bearing      Mobility  Bed Mobility Overal bed mobility: Needs Assistance Bed Mobility: Supine to Sit     Supine to sit: Supervision, HOB elevated, Used rails          Transfers Overall transfer level: Needs assistance Equipment used: Rolling walker (2 wheels) Transfers: Sit to/from Stand, Bed to chair/wheelchair/BSC Sit to Stand: Mod assist, +2 physical assistance, Min assist Stand pivot transfers: Mod assist, +2 physical assistance Step pivot transfers: Mod assist, +2 physical assistance       General transfer comment: Pt stood from lowest bed height. Cued proper hand placement using RW. He pushed up with RUE from bed and LUE on walker. Increased time to achieve upright posture. Transferred to recliner chair on left. Pt initially taking hops, but fatiguing and resorting to pivoting on left foot to reach chair.    Ambulation/Gait Ambulation/Gait assistance: Mod assist, +2 physical assistance Gait Distance (Feet): 3 Feet Assistive device: Rolling walker (2 wheels) Gait Pattern/deviations: Step-to pattern       General Gait Details: Pt utilized a hopping technique with heavy reliance on BUE support to advance LLE. He maintained RLE NWB at all times. Pt quickly fatigued and was unable to achieve foot clearance, pivoting on sole of foot.  Stairs            Psychologist, [facility]  Tilt Bed    Modified Rankin (Stroke Patients Only)       Balance Overall balance assessment: Needs assistance Sitting-balance support: Feet  supported, Bilateral upper extremity supported Sitting balance-Leahy Scale: Fair Sitting balance - Comments: Sat EOB with close supervision. He was able to reach multi-directions without LOB.   Standing balance support: Bilateral upper extremity supported, During functional activity, Reliant on assistive device for balance Standing balance-Leahy Scale: Poor Standing balance comment: Pt dependent on RW and external support of therapists                             Pertinent Vitals/Pain Pain Assessment Pain Assessment: 0-10 Pain Score: 6  Pain Location: R residual limb Pain Descriptors / Indicators: Shooting, Sharp Pain Intervention(s): Monitored during session, Limited activity within patient's tolerance, Repositioned    Home Living Family/patient expects to be discharged to:: Private residence Living Arrangements: Spouse/significant other Available Help at Discharge: Family;Available 24 hours/day (Wife 24/7, can be supportive, but unable to physically assist.) Type of Home: Apartment (Condo) Home Access: Stairs to enter Entrance Stairs-Rails: Right Entrance Stairs-Number of Steps: 10 (7+landing+3; pt reports could drive to the back, where there is a level entry to the patio and entrance through sliding glass door) Alternate Level Stairs-Number of Steps: 14 Home Layout: Two level;1/2 bath on main level;Bed/bath upstairs Home Equipment: Rolling Walker (2 wheels);Tub bench      Prior Function Prior Level of Function : Independent/Modified Independent;Driving             Mobility Comments: Ambulates without AD. 1 fall d/t slipping on ice leading to admission. ADLs Comments: Indep with ADLs/IADLs. Driving.     Extremity/Trunk Assessment   Upper Extremity Assessment Upper Extremity Assessment: Defer to OT evaluation    Lower Extremity Assessment Lower Extremity Assessment: RLE deficits/detail RLE Deficits / Details: Pt POD 1 s/p BKA. Decreased hip and knee AROM.  Decreased strength, grossly 2+/5. RLE: Unable to fully assess due to pain RLE Coordination: decreased gross motor    Cervical / Trunk Assessment Cervical / Trunk Assessment: Other exceptions Cervical / Trunk Exceptions: R BKA  Communication   Communication Communication: No apparent difficulties    Cognition Arousal: Alert Behavior During Therapy: WFL for tasks assessed/performed   PT - Cognitive impairments: No apparent impairments                       PT - Cognition Comments: Pt A,Ox4 Following commands: Intact       Cueing Cueing Techniques: Verbal cues     General Comments General comments (skin integrity, edema, etc.): Educated pt on HEP - Medbridge Access Code: [email or web address]. Discussed desensitization techniques and phatom limb pain.    Exercises Amputee Exercises Quad Sets: Both, AROM, 5 reps (with 5 second hold) Gluteal Sets: Both, AROM, 5 reps (with 5 second hold) Straight Leg Raises: Right, AROM, Limitations, 5 reps Straight Leg Raises Limitations: Decreased hip flexion, able to get off recliner chair but <20deg.   Assessment/Plan    PT Assessment Patient needs continued PT services  PT Problem List Decreased strength;Decreased activity tolerance;Decreased balance;Decreased mobility;Decreased cognition;Decreased knowledge of use of DME;Decreased safety awareness;Pain       PT Treatment Interventions DME instruction;Gait training;Stair training;Functional mobility training;Therapeutic activities;Therapeutic exercise;Balance training;Neuromuscular re-education;Cognitive remediation;Patient/family education;Wheelchair mobility training    PT Goals (Current goals can be found in the Care Plan section)  Acute Rehab PT Goals Patient Stated Goal: Regain independence PT  Goal Formulation: With patient Time For Goal Achievement: 12/07/24 Potential to Achieve Goals: Good    Frequency Min 3X/week     Co-evaluation PT/OT/SLP Co-Evaluation/Treatment:  Yes Reason for Co-Treatment: Complexity of the patient's impairments (multi-system involvement);For patient/therapist safety;To address functional/ADL transfers PT goals addressed during session: Mobility/safety with mobility;Balance;Proper use of DME OT goals addressed during session: ADL's and self-care       AM-PAC PT 6 Clicks Mobility  Outcome Measure Help needed turning from your back to your side while in a flat bed without using bedrails?: A Little Help needed moving from lying on your back to sitting on the side of a flat bed without using bedrails?: A Little Help needed moving to and from a bed to a chair (including a wheelchair)?: Total Help needed standing up from a chair using your arms (e.g., wheelchair or bedside chair)?: Total Help needed to walk in hospital room?: Total Help needed climbing 3-5 steps with a railing? : Total 6 Click Score: 10    End of Session Equipment Utilized During Treatment: Gait belt Activity Tolerance: Patient tolerated treatment well Patient left: in chair;with call bell/phone within reach Nurse Communication: Mobility status;Other (comment) (need for green alarm box for chair alarm to be attached to) PT Visit Diagnosis: Other abnormalities of gait and mobility (R26.89);Unsteadiness on feet (R26.81);Difficulty in walking, not elsewhere classified (R26.2)    Time: 8973-8948 PT Time Calculation (min) (ACUTE ONLY): 25 min   Charges:   PT Evaluation $PT Eval Moderate Complexity: 1 Mod   PT General Charges $$ ACUTE PT VISIT: 1 Visit         Randall SAUNDERS, PT, DPT Acute Rehabilitation Services Office: [phone] Secure Chat Preferred  Craig Knapp 11/23/2024, 1:33 PM

## 2024-11-23 NOTE — Evaluation (Signed)
 Occupational Therapy Evaluation Patient Details Name: Craig Knapp MRN: [id number] DOB: 1946-01-26 Today's Date: 11/23/2024   History of Present Illness   Pt is a 79 y.o. M adm 11/18/24 for chronic osteomylitis with draining sinus tract and pathologic fracture of the tibia who failed conservative measures and elected for surgical management. Pt s/p R BKA 2/4. PMHx: CAD, CABG, HTN, and DJD.     Clinical Impressions Patient admitted for the diagnosis above.  PTA he lives at home with his spouse, who can provide supportive assist, but not physical assist.  Patient remained independent with ADL, iADL, mobility and continued driving.  Presents with deficits listed below, currently needing +2 for mobility and up to Max A for lower body ADL.  Patient is extremely motivated, and demonstrates the ability to reach a Mod I level with intensive post acute rehab.  Patient will benefit from intensive inpatient follow-up therapy, >3 hours/day.  OT will continue efforts in the acute setting to address deficits.       If plan is discharge home, recommend the following:   A lot of help with bathing/dressing/bathroom;Two people to help with walking and/or transfers     Functional Status Assessment   Patient has had a recent decline in their functional status and demonstrates the ability to make significant improvements in function in a reasonable and predictable amount of time.     Equipment Recommendations   BSC/3in1;Wheelchair (measurements OT);Wheelchair cushion (measurements OT)     Recommendations for Other Services   Rehab consult     Precautions/Restrictions   Precautions Precautions: Fall Recall of Precautions/Restrictions: Intact Precaution/Restrictions Comments: Wound Vac Required Braces or Orthoses: Other Brace Other Brace: Limb Guard Restrictions Weight Bearing Restrictions Per Provider Order: Yes RLE Weight Bearing Per Provider Order: Non weight bearing     Mobility  Bed Mobility Overal bed mobility: Needs Assistance Bed Mobility: Supine to Sit     Supine to sit: Supervision, HOB elevated, Used rails          Transfers Overall transfer level: Needs assistance   Transfers: Sit to/from Stand, Bed to chair/wheelchair/BSC Sit to Stand: Mod assist, +2 physical assistance, Min assist Stand pivot transfers: Mod assist, +2 physical assistance   Step pivot transfers: +2 physical assistance, Mod assist            Balance Overall balance assessment: Needs assistance Sitting-balance support: Feet supported, Bilateral upper extremity supported Sitting balance-Leahy Scale: Fair     Standing balance support: Reliant on assistive device for balance Standing balance-Leahy Scale: Poor                             ADL either performed or assessed with clinical judgement   ADL Overall ADL's : Needs assistance/impaired Eating/Feeding: Set up;Sitting   Grooming: Wash/dry hands;Wash/dry face;Set up;Sitting   Upper Body Bathing: Set up;Sitting   Lower Body Bathing: Moderate assistance;Sitting/lateral leans   Upper Body Dressing : Set up;Sitting   Lower Body Dressing: Maximal assistance;Sit to/from stand   Toilet Transfer: Moderate assistance;+2 for physical assistance;Rolling walker (2 wheels);Stand-pivot;BSC/3in1   Toileting- Architect and Hygiene: Maximal assistance;Sit to/from stand               Vision Patient Visual Report: No change from baseline       Perception Perception: Not tested       Praxis Praxis: Not tested       Pertinent Vitals/Pain Pain Assessment Pain Assessment: 0-10 Pain Score: 6  Pain Location: R leg Pain Descriptors / Indicators: Shooting, Sharp Pain Intervention(s): Monitored during session     Extremity/Trunk Assessment Upper Extremity Assessment Upper Extremity Assessment: Overall WFL for tasks assessed   Lower Extremity Assessment Lower Extremity Assessment: Defer to PT  evaluation   Cervical / Trunk Assessment Cervical / Trunk Assessment: Other exceptions Cervical / Trunk Exceptions: R BKA   Communication Communication Communication: No apparent difficulties   Cognition Arousal: Alert Behavior During Therapy: WFL for tasks assessed/performed Cognition: No apparent impairments                               Following commands: Intact       Cueing  General Comments   Cueing Techniques: Verbal cues   VSS on RA   Exercises     Shoulder Instructions      Home Living Family/patient expects to be discharged to:: Private residence Living Arrangements: Spouse/significant other Available Help at Discharge: Family;Available 24 hours/day Type of Home: Other(Comment) (Condo) Home Access: Stairs to enter Entrance Stairs-Number of Steps: 10  7&3 with a landing. Entrance Stairs-Rails: Right Home Layout: Two level;1/2 bath on main level;Bed/bath upstairs Alternate Level Stairs-Number of Steps: could drive to the back, level entry from there - back patio   Bathroom Shower/Tub: Chief Strategy Officer: Handicapped height Bathroom Accessibility: Yes How Accessible: Accessible via walker Home Equipment: Rolling Walker (2 wheels);Tub bench          Prior Functioning/Environment Prior Level of Function : Independent/Modified Independent;Driving                    OT Problem List: Decreased strength;Decreased activity tolerance;Impaired balance (sitting and/or standing);Pain;Increased edema   OT Treatment/Interventions: Self-care/ADL training;Therapeutic activities;Balance training;DME and/or AE instruction;Patient/family education;Therapeutic exercise      OT Goals(Current goals can be found in the care plan section)   Acute Rehab OT Goals Patient Stated Goal: Return home OT Goal Formulation: With patient Time For Goal Achievement: 12/07/24 Potential to Achieve Goals: Good   OT Frequency:  Min 2X/week     Co-evaluation PT/OT/SLP Co-Evaluation/Treatment: Yes Reason for Co-Treatment: Complexity of the patient's impairments (multi-system involvement)   OT goals addressed during session: ADL's and self-care      AM-PAC OT 6 Clicks Daily Activity     Outcome Measure Help from another person eating meals?: None Help from another person taking care of personal grooming?: None Help from another person toileting, which includes using toliet, bedpan, or urinal?: A Lot Help from another person bathing (including washing, rinsing, drying)?: A Lot Help from another person to put on and taking off regular upper body clothing?: A Little Help from another person to put on and taking off regular lower body clothing?: A Lot 6 Click Score: 17   End of Session Equipment Utilized During Treatment: Gait belt;Rolling walker (2 wheels) Nurse Communication: Mobility status  Activity Tolerance: Patient tolerated treatment well Patient left: in chair;with call bell/phone within reach;with chair alarm set  OT Visit Diagnosis: Unsteadiness on feet (R26.81);Muscle weakness (generalized) (M62.81);Pain Pain - Right/Left: Right Pain - part of body: Leg                Time: 8897-8876 OT Time Calculation (min): 21 min Charges:  OT General Charges $OT Visit: 1 Visit OT Evaluation $OT Eval Moderate Complexity: 1 Mod  11/23/2024  RP, OTR/L  Acute Rehabilitation Services  Office:  [phone]   Craig Knapp  Craig Knapp 11/23/2024, 11:31 AM

## 2024-11-23 NOTE — Progress Notes (Signed)
" °  Inpatient Rehabilitation Admissions Coordinator   Met with patient and wife at bedside for rehab assessment. We discussed goals and expectations of a possible CIR admit. They prefer CIR for rehab. Family can provide expected caregiver support that is recommended. I feel he is a great CIR candidate. I will clarify  in the am when bed will be available to admit. Please call me with any questions.   Heron Leavell, RN, MSN Rehab Admissions Coordinator [phone]   "

## 2024-11-23 NOTE — Progress Notes (Signed)
 " PROGRESS NOTE Craig Knapp  FMW:[id number] DOB: [birth year] DOA: 11/18/2024 PCP: [facility] Emil Schanz, MD  Brief Narrative/Hospital Course: Craig Knapp is a 79 y.o. male with PMH of HTN, HLD, CAD status post CABG, chronic right tibia osteomyelitis who presented to ED after a fall on ice and was unable to walk with severe pain. In ZI:qnlwi to have a comminuted and displaced tibial fracture at the old fracture site. Of note, he has been having chronic osteomyelitis but has been off antibiotics for the past 4 to 5 years. Patient was admitted for further management-evaluated by Dr. Kendal and subsequently Dr. Harden and underwent right BKA 2/4  Subjective: Seen and examined today On the edge of the bed having his foot.  Pain controlLED Overnight on room air afebrile, VSS, Labs-reviewed mild hyponatremia stable renal function  Assessment and plan:  Right tibial fracture s/p fall Chronic osteomyelitis of tibia: Seen by orthopedic team and underwent right BKA. Continue postop plan as per orthopedic with-Multimodal pain management,, wound care, wound VAC Continue PT OT discharge planning> anticipating rehab placement.  Lives at home with his wife  Acute left great toe gout: Colchicine  started.  Essential hypertension: Well-controlled,.  Continue home amlodipine  10, lisinopril  20.    CAD-history of CABG HLD: no chest pain.  Continue just aspirin  81, Lipitor  80  Mild anemia: Continue iron supplementation  Leukocytosis: Elevated on admission likely reactive in the setting of fall and fracture, resolving.  Hyponatremia: Mild encourage oral intake  Class I Obesity w/ Body mass index is 34.66 kg/m.: Will benefit with PCP follow-up, weight loss,healthy lifestyle  Deconditioning/debility Mobility: PT Orders: Active PT Follow up Rec:    DVT prophylaxis: SCDs Start: 11/22/24 1414 SCD's Start: 11/22/24 1414 Code Status:   Code Status: Full Code Family Communication: plan of care  discussed with patient at bedside. Patient status is: Remains hospitalized because of severity of illness Level of care: Med-Surg   Dispo: The patient is from: Home with wife and independent PTA            Anticipated disposition: Pending PT OT eval Objective: Vitals last 24 hrs: Vitals:   11/22/24 1935 11/22/24 2323 11/23/24 0413 11/23/24 0819  BP: 130/64 119/66 136/84 134/74  Pulse: 85 66 66   Resp: 18 20 20    Temp: 97.7 F (36.5 C) (!) 97.5 F (36.4 C) 97.7 F (36.5 C)   TempSrc:  Oral Oral   SpO2: 98% 97% 99%   Weight:      Height:        Physical Examination: General exam: alert awake, oriented, older than stated age HEENT:Oral mucosa moist, Ear/Nose WNL grossly Respiratory system: Bilaterally clear BS,no use of accessory muscle Cardiovascular system: S1 & S2 +, No JVD. Gastrointestinal system: Abdomen soft,NT,ND, BS+ Nervous System: Alert, awake, moving all extremities,and following commands. Extremities: extremities warm, leg edema negative right foot with post BKA dressing and wound VAC in place Skin: Warm, no rashes MSK: Normal muscle bulk,tone, power   Medications reviewed:  Scheduled Meds:  acetaminophen   1,000 mg Oral Q6H   acidophilus  1 capsule Oral Daily   amLODipine   10 mg Oral Daily   vitamin C   1,000 mg Oral Daily   aspirin  EC  81 mg Oral QHS   atorvastatin   80 mg Oral QHS   colchicine   0.6 mg Oral Daily   docusate sodium   100 mg Oral Daily   ferrous sulfate   325 mg Oral Daily   lisinopril   20 mg Oral Daily  nutrition supplement (JUVEN)  1 packet Oral BID BM   pantoprazole   40 mg Oral BID AC   zinc  sulfate (50mg  elemental zinc )  220 mg Oral Daily   Continuous Infusions:  sodium chloride  40 mL/hr at 11/22/24 1428   vancomycin  1,500 mg (11/22/24 1754)   Diet: Diet Order             Diet Heart Room service appropriate? Yes; Fluid consistency: Thin  Diet effective now                    Unresulted Labs (From admission, onward)      Start     Ordered   11/24/24 0500  CBC  Tomorrow morning,   R        11/23/24 0828   11/23/24 0500  Basic metabolic panel  Daily,   R      11/22/24 1414           Data Reviewed: I have personally reviewed following labs and imaging studies ( see epic result tab) CBC: Recent Labs  Lab 11/18/24 1511 11/19/24 0400 11/21/24 0456  WBC 15.1* 17.4* 11.6*  NEUTROABS  --   --  9.8*  HGB 14.7 13.8 12.4*  HCT 43.4 41.9 35.3*  MCV 97.1 96.8 95.1  PLT 244 251 177   CMP: Recent Labs  Lab 11/18/24 1511 11/19/24 0400 11/21/24 0456 11/23/24 0333  NA 135 132* 132* 131*  K 4.8 4.9 4.1 4.5  CL 101 97* 97* 97*  CO2 24 22 23 22   GLUCOSE 122* 106* 92 148*  BUN 13 14 13 23   CREATININE 1.12 1.31* 0.89 0.94  CALCIUM  9.2 9.1 8.7* 9.0  MG  --  1.7  --   --   PHOS  --  4.6  --   --    GFR: Estimated Creatinine Clearance: 76.7 mL/min (by C-G formula based on SCr of 0.94 mg/dL). Recent Labs  Lab 11/18/24 1511 11/19/24 0400  AST 36 41  ALT 30 27  ALKPHOS 64 62  BILITOT 0.7 1.6*  PROT 7.0 7.4  ALBUMIN 4.2 4.1   No results for input(s): LIPASE, AMYLASE in the last 168 hours. No results for input(s): AMMONIA in the last 168 hours. Coagulation Profile: No results for input(s): INR, PROTIME in the last 168 hours. Antimicrobials/Microbiology: Anti-infectives (From admission, onward)    Start     Dose/Rate Route Frequency Ordered Stop   11/22/24 1252  vancomycin  (VANCOCIN ) powder  Status:  Discontinued          As needed 11/22/24 1252 11/22/24 1315   11/22/24 1215  vancomycin  (VANCOCIN ) powder  Status:  Discontinued          As needed 11/22/24 1215 11/22/24 1315   11/22/24 0800  ceFAZolin  (ANCEF ) IVPB 2g/100 mL premix  Status:  Discontinued        2 g 200 mL/hr over 30 Minutes Intravenous On call to O.R. 11/22/24 0711 11/22/24 1356   11/19/24 1700  vancomycin  (VANCOREADY) IVPB 1500 mg/300 mL        1,500 mg 150 mL/hr over 120 Minutes Intravenous Every 24 hours 11/19/24 0936  11/23/24 2359   11/18/24 1700  vancomycin  (VANCOREADY) IVPB 1750 mg/350 mL  Status:  Discontinued        1,750 mg 175 mL/hr over 120 Minutes Intravenous Every 24 hours 11/18/24 1619 11/19/24 0936         Component Value Date/Time   SDES ABSCESS 01/18/2009 1220   SDES ABSCESS  01/18/2009 1220   SDES ABSCESS 01/18/2009 1220   SPECREQUEST RIGHT TIBIA 01/18/2009 1220   SPECREQUEST RIGHT TIBIA 01/18/2009 1220   SPECREQUEST RIGHT TIBIA 01/18/2009 1220   CULT NO GROWTH 3 DAYS 01/18/2009 1220   CULT NO ACID FAST BACILLI ISOLATED IN 6 WEEKS 01/18/2009 1220   CULT NO ANAEROBES ISOLATED 01/18/2009 1220   REPTSTATUS 01/21/2009 FINAL 01/18/2009 1220   REPTSTATUS 03/04/2009 FINAL 01/18/2009 1220   REPTSTATUS 01/23/2009 FINAL 01/18/2009 1220    Procedures: Procedures (LRB): RIGHT BELOW KNEE AMPUTATION (Right)   Mennie LAMY, MD [facility] 11/23/2024, 10:11 AM   "

## 2024-11-23 NOTE — Progress Notes (Signed)
 Patient ID: Craig Knapp, male   DOB: July 08, 1946, 79 y.o.   MRN: [id number] Patient is postoperative day 1 right below-knee amputation.  Patient states that his pain is much better after the amputation.  There is 150 cc in the wound VAC canister with a good suction fit.  Patient with a acute gout left great toe orders are placed for colchicine .  Uric acid 6.2 this morning.  Discharge planning based on physical therapy recommendations.

## 2024-11-23 NOTE — TOC CAGE-AID Note (Signed)
 Transition of Care [facility]) - CAGE-AID Screening   Patient Details  Name: Craig Knapp MRN: [id number] Date of Birth: May 12, 1946  Transition of Care [facility]) CM/SW Contact:    Chez Bulnes E Gianpaolo Mindel, LCSW Phone Number: 11/23/2024, 10:55 AM   Clinical Narrative: Patient states he drinks about 4 16 oz Kister Lights per day. Patient denies other substance use. Patient denies resources.   CAGE-AID Screening:    Have You Ever Felt You Ought to Cut Down on Your Drinking or Drug Use?: No Have People Annoyed You By Critizing Your Drinking Or Drug Use?: No Have You Felt Bad Or Guilty About Your Drinking Or Drug Use?: No Have You Ever Had a Drink or Used Drugs First Thing In The Morning to Steady Your Nerves or to Get Rid of a Hangover?: No CAGE-AID Score: 0  Substance Abuse Education Offered: Yes

## 2024-11-23 NOTE — Plan of Care (Signed)
" °  Problem: Education: Goal: Knowledge of General Education information will improve Description: Including pain rating scale, medication(s)/side effects and non-pharmacologic comfort measures Outcome: Progressing   Problem: Health Behavior/Discharge Planning: Goal: Ability to manage health-related needs will improve Outcome: Progressing   Problem: Clinical Measurements: Goal: Ability to maintain clinical measurements within normal limits will improve Outcome: Progressing Goal: Will remain free from infection Outcome: Progressing Goal: Diagnostic test results will improve Outcome: Progressing Goal: Respiratory complications will improve Outcome: Progressing Goal: Cardiovascular complication will be avoided Outcome: Progressing   Problem: Activity: Goal: Risk for activity intolerance will decrease Outcome: Progressing   Problem: Pain Managment: Goal: General experience of comfort will improve and/or be controlled Outcome: Progressing   Problem: Safety: Goal: Ability to remain free from injury will improve Outcome: Progressing   Problem: Clinical Measurements: Goal: Postoperative complications will be avoided or minimized Outcome: Progressing   Problem: Self-Care: Goal: Ability to meet self-care needs will improve Outcome: Progressing   Problem: Skin Integrity: Goal: Demonstration of wound healing without infection will improve Outcome: Progressing   "

## 2024-11-23 NOTE — Hospital Course (Addendum)
 Craig Knapp is a 79 y.o. male with PMH of HTN, HLD, CAD status post CABG, chronic right tibia osteomyelitis who presented to ED after a fall on ice and was unable to walk with severe pain. In ZI:qnlwi to have a comminuted and displaced tibial fracture at the old fracture site. Of note, he has been having chronic osteomyelitis but has been off antibiotics for the past 4 to 5 years. Patient was admitted for further management-evaluated by Dr. Kendal and subsequently Dr. Harden and underwent right BKA 2/4 PT OT has recommended CIR and patient will have bed on 2/7  Subjective: Seen and examined Resting comfortably in the bedside chair. Pain is controlled. Overnight hemodynamically stable afebrile on room air.  Labs reviewed stable lites mild anemia and leukocytosis He worked with PT OT yesterday and recommending inpatient rehab  Discharge Diagnoses:   Right tibial fracture s/p fall Chronic osteomyelitis of tibia: S/p right BKA.Continue postop plan as per orthopedic with-Multimodal pain management,wound care, wound VAC Continue PT OT -recommending inpatient rehab he lives at home with family with good support  He will have bed for rehab tomorrow  Acute left great toe gout: Colchicine  started here.  Essential hypertension: Well-controlled,.  Continue home amlodipine  10, lisinopril  20.    CAD-history of CABG HLD: no chest pain.  Continue just aspirin  81, Lipitor  80  Mild anemia: Continue iron supplementation  Leukocytosis: Elevated on admission likely reactive in the setting of fall and fracture, resolving.  Hyponatremia: Mild encourage oral intake  Class I Obesity w/ Body mass index is 34.66 kg/m.: Will benefit with PCP follow-up, weight loss,healthy lifestyle  Deconditioning/debility Mobility: PT Orders: Active PT Follow up Rec: Acute Inpatient Rehab (3hours/Day)11/23/2024 1200   DVT prophylaxis: SCDs Start: 11/22/24 1414 SCD's Start: 11/22/24 1414 Code Status:   Code  Status: Full Code Family Communication: plan of care discussed with patient at bedside. Patient status is: Remains hospitalized because of severity of illness Level of care: Med-Surg   Dispo: The patient is from: Home with wife and independent PTA            Anticipated disposition: CIR    Objective: Vitals last 24 hrs: Vitals:   11/23/24 2050 11/23/24 2345 11/24/24 0505 11/24/24 0722  BP: (!) 118/55 112/62 128/61 (!) 112/92  Pulse: 64 61 60 84  Resp: 18 18 17 17   Temp: 98 F (36.7 C) 97.7 F (36.5 C) (!) 97.5 F (36.4 C) 98.3 F (36.8 C)  TempSrc:    Oral  SpO2: 97% 97% 98% 97%  Weight:      Height:       Physical Examination: General exam: alert awake HEENT:Oral mucosa moist, Ear/Nose WNL grossly Respiratory system: Bilaterally clear BS,no use of accessory muscle Cardiovascular system: S1 & S2 +, No JVD. Gastrointestinal system: Abdomen soft,NT,ND, BS+ Nervous System: Alert, awake, moving all extremities,and following commands. Extremities: extremities warm, lRT BKA dressing in place Skin: Warm, no rashes MSK: Normal muscle bulk,tone, power

## 2024-11-24 LAB — BASIC METABOLIC PANEL WITH GFR
Anion gap: 12 (ref 5–15)
BUN: 31 mg/dL — ABNORMAL HIGH (ref 8–23)
CO2: 23 mmol/L (ref 22–32)
Calcium: 8.9 mg/dL (ref 8.9–10.3)
Chloride: 100 mmol/L (ref 98–111)
Creatinine, Ser: 0.96 mg/dL (ref 0.61–1.24)
GFR, Estimated: >60 mL/min
Glucose, Bld: 119 mg/dL — ABNORMAL HIGH (ref 70–99)
Potassium: 4.4 mmol/L (ref 3.5–5.1)
Sodium: 135 mmol/L (ref 135–145)

## 2024-11-24 LAB — CBC
HCT: 34.9 % — ABNORMAL LOW (ref 39.0–52.0)
Hemoglobin: 11.7 g/dL — ABNORMAL LOW (ref 13.0–17.0)
MCH: 32.9 pg (ref 26.0–34.0)
MCHC: 33.5 g/dL (ref 30.0–36.0)
MCV: 98 fL (ref 80.0–100.0)
Platelets: 286 10*3/uL (ref 150–400)
RBC: 3.56 MIL/uL — ABNORMAL LOW (ref 4.22–5.81)
RDW: 12.5 % (ref 11.5–15.5)
WBC: 11 10*3/uL — ABNORMAL HIGH (ref 4.0–10.5)
nRBC: 0 % (ref 0.0–0.2)

## 2024-11-24 LAB — SURGICAL PATHOLOGY

## 2024-11-24 MED ORDER — SENNOSIDES-DOCUSATE SODIUM 8.6-50 MG PO TABS
2.0000 | ORAL_TABLET | Freq: Two times a day (BID) | ORAL | Status: AC
  Administered 2024-11-24 (×2): 2 via ORAL
  Filled 2024-11-24 (×2): qty 2

## 2024-11-24 MED ORDER — POLYETHYLENE GLYCOL 3350 17 G PO PACK
17.0000 g | PACK | Freq: Every day | ORAL | Status: AC | PRN
  Administered 2024-11-24: 17 g via ORAL
  Filled 2024-11-24: qty 1

## 2024-11-24 NOTE — Progress Notes (Signed)
 Patient ID: Craig Knapp, male   DOB: [birth year], 79 y.o.   MRN: [id number] Patient is postoperative day 2 right below-knee amputation.  Patient states that his pain is minimal.  There is no new drainage in the wound VAC canister with about 150 cc.  Plan for discontinuing of the wound VAC after discharge from inpatient rehab.  Left great toe redness and pain is resolving.  Would continue colchicine  0 point 0.6 mg daily.  Anticipate discharge to inpatient rehab.

## 2024-11-24 NOTE — Plan of Care (Signed)
" °  Problem: Education: Goal: Knowledge of General Education information will improve Description: Including pain rating scale, medication(s)/side effects and non-pharmacologic comfort measures Outcome: Progressing   Problem: Health Behavior/Discharge Planning: Goal: Ability to manage health-related needs will improve Outcome: Progressing   Problem: Clinical Measurements: Goal: Ability to maintain clinical measurements within normal limits will improve Outcome: Progressing Goal: Will remain free from infection Outcome: Progressing Goal: Diagnostic test results will improve Outcome: Progressing Goal: Respiratory complications will improve Outcome: Progressing Goal: Cardiovascular complication will be avoided Outcome: Progressing   Problem: Activity: Goal: Risk for activity intolerance will decrease Outcome: Progressing   Problem: Safety: Goal: Ability to remain free from injury will improve Outcome: Progressing   Problem: Skin Integrity: Goal: Risk for impaired skin integrity will decrease Outcome: Progressing   Problem: Pain Management: Goal: Pain level will decrease with appropriate interventions Outcome: Progressing   "

## 2024-11-24 NOTE — Progress Notes (Signed)
 Occupational Therapy Treatment Patient Details Name: Craig Knapp MRN: [id number] DOB: [birth year] Today's Date: 11/24/2024   History of present illness Pt is a 79 y.o. M adm 11/18/24 for chronic osteomylitis with draining sinus tract and pathologic fracture of the tibia who failed conservative measures and elected for surgical management. Pt s/p R BKA 2/4. PMHx: CAD, CABG, HTN, and DJD.   OT comments  Second session this date, first session cut short by breakfast tray arrival.  Focus of treatment was sit to stand technique: verbal cues to scoot forward, hand placement and Mod A to transition R hand from recliner to RW.  Once standing, practiced taking one hand off RW to reach for tabletop foam cups and stacking.  Able to reach across midline in stand with occasional Min A.  Patient provided therapeutic rest breaks, as he does fatigue quickly.  Patient will benefit from intensive inpatient follow-up therapy, >3 hours/day.      If plan is discharge home, recommend the following:  A lot of help with bathing/dressing/bathroom;Two people to help with walking and/or transfers   Equipment Recommendations  BSC/3in1;Wheelchair (measurements OT);Wheelchair cushion (measurements OT)    Recommendations for Other Services      Precautions / Restrictions Precautions Precautions: Fall Recall of Precautions/Restrictions: Impaired Precaution/Restrictions Comments: RLE Wound Vac Required Braces or Orthoses: Other Brace Other Brace: RLE Limb Guard Restrictions Weight Bearing Restrictions Per Provider Order: Yes RLE Weight Bearing Per Provider Order: Non weight bearing       Mobility Bed Mobility Overal bed mobility: Needs Assistance Bed Mobility: Supine to Sit     Supine to sit: Supervision, HOB elevated, Used rails          Transfers Overall transfer level: Needs assistance Equipment used: Rolling walker (2 wheels) Transfers: Sit to/from Stand Sit to Stand: Mod assist, From elevated  surface     Step pivot transfers: Mod assist           Balance Overall balance assessment: Needs assistance Sitting-balance support: Feet supported, Bilateral upper extremity supported Sitting balance-Leahy Scale: Good     Standing balance support: Reliant on assistive device for balance Standing balance-Leahy Scale: Poor Standing balance comment: Pt dependent on RW and external support of therapists                           ADL either performed or assessed with clinical judgement   ADL   Eating/Feeding: Set up;Sitting   Grooming: Wash/dry hands;Wash/dry face;Set up;Sitting               Lower Body Dressing: Maximal assistance;Sit to/from stand   Toilet Transfer: Minimal assistance;Stand-pivot;BSC/3in1;Rolling walker (2 wheels)   Toileting- Clothing Manipulation and Hygiene: Minimal assistance;Sit to/from stand;Sitting/lateral lean              Extremity/Trunk Assessment Upper Extremity Assessment Upper Extremity Assessment: Overall WFL for tasks assessed   Lower Extremity Assessment Lower Extremity Assessment: Defer to PT evaluation   Cervical / Trunk Assessment Cervical / Trunk Assessment: Other exceptions Cervical / Trunk Exceptions: R BKA    Vision Patient Visual Report: No change from baseline     Perception Perception Perception: Not tested   Praxis Praxis Praxis: Not tested   Communication Communication Communication: No apparent difficulties   Cognition Arousal: Alert Behavior During Therapy: WFL for tasks assessed/performed Cognition: No apparent impairments  Following commands: Intact        Cueing   Cueing Techniques: Verbal cues  Exercises      Shoulder Instructions       General Comments      Pertinent Vitals/ Pain       Pain Assessment Pain Assessment: Faces Faces Pain Scale: Hurts little more Pain Location: R residual limb Pain Descriptors / Indicators:  Tender Pain Intervention(s): Monitored during session                                                          Frequency  Min 2X/week        Progress Toward Goals  OT Goals(current goals can now be found in the care plan section)  Progress towards OT goals: Progressing toward goals  Acute Rehab OT Goals OT Goal Formulation: With patient Time For Goal Achievement: 12/07/24 Potential to Achieve Goals: Good  Plan      Co-evaluation                 AM-PAC OT 6 Clicks Daily Activity     Outcome Measure   Help from another person eating meals?: None Help from another person taking care of personal grooming?: None Help from another person toileting, which includes using toliet, bedpan, or urinal?: A Lot Help from another person bathing (including washing, rinsing, drying)?: A Lot Help from another person to put on and taking off regular upper body clothing?: A Little Help from another person to put on and taking off regular lower body clothing?: A Lot 6 Click Score: 17    End of Session Equipment Utilized During Treatment: Gait belt;Rolling walker (2 wheels)  OT Visit Diagnosis: Unsteadiness on feet (R26.81);Muscle weakness (generalized) (M62.81);Pain Pain - Right/Left: Right Pain - part of body: Leg   Activity Tolerance Patient tolerated treatment well   Patient Left in chair;with call bell/phone within reach;with chair alarm set   Nurse Communication Mobility status        Time: 1000-1022 OT Time Calculation (min): 22 min  Charges: OT General Charges $OT Visit: 1 Visit OT Treatments $Self Care/Home Management : 8-22 mins $Therapeutic Activity: 8-22 mins  11/24/2024  RP, OTR/L  Acute Rehabilitation Services  Office:  [phone]   JEFTE CARITHERS 11/24/2024, 10:25 AM

## 2024-11-24 NOTE — TOC Progression Note (Addendum)
 Transition of Care [facility]) - Progression Note    Patient Details  Name: Craig Knapp MRN: [id number] Date of Birth: [birth year]  [facility]) CM/SW Contact  Tom-Johnson, Harvest Muskrat, RN Phone Number: 11/24/2024, 9:08 AM  Clinical Narrative:     Patient underwent Rt BKA with Prevena Wound Vac placement. Completed IV abx. CIR recommended and following for possible admit.   CM will continue to follow as patient progresses with care toward discharge.     10:24- Updated by CIR that a bed will be available to admit patient tomorrow Saturday 11/25/24.  CM will continue to follow.                Expected Discharge Plan and Services                                               Social Drivers of Health (SDOH) Interventions SDOH Screenings   Food Insecurity: No Food Insecurity (11/18/2024)  Housing: Low Risk (11/18/2024)  Transportation Needs: No Transportation Needs (11/18/2024)  Utilities: Not At Risk (11/18/2024)  Alcohol Screen: Medium Risk (10/24/2024)  Depression (PHQ2-9): Low Risk (07/04/2024)  Financial Resource Strain: Low Risk (10/24/2024)  Physical Activity: Unknown (10/24/2024)  Social Connections: Moderately Isolated (11/18/2024)  Stress: No Stress Concern Present (10/24/2024)  Tobacco Use: High Risk (11/22/2024)  Health Literacy: Adequate Health Literacy (07/04/2024)    Readmission Risk Interventions     No data to display

## 2024-11-24 NOTE — H&P (Shared)
 "   Physical Medicine and Rehabilitation Admission H&P    Chief Complaint  Patient presents with   Fall  : HPI: Craig Knapp is a 79 year old right-handed male with history significant for hypertension, hyperlipidemia, CAD/CABG 2006 maintained on aspirin  81 mg daily, GERD, quit smoking 20 years ago, class I obesity BMI 34.66, lumbar laminectomy L3, 4, 03/08/2023, chronic osteomyelitis of right tibia and has been off antibiotics for a number of years.  Per chart review patient lives with spouse.  Two-level condo/apartment with one half bath on main level, bed/bath upstairs and level entry to the patio of the home.  Wife is limited physically.  Presented 11/18/2024 with chronic osteomyelitis of right tibia who sustained a fall at home when he slipped on the ice.  Denied loss of consciousness or striking his head.  In the ED blood pressure 165/61 at admission chemistries unremarkable except glucose 122, WBC 15,100.  DG right tib-fib showed chronic posttraumatic and infectious changes in the mid tibia and fibula as well as a newly comminuted mildly displaced fracture of the tibia noted at the old fracture site.  Increased lucency noted in the region of reported osteomyelitis in the mid tibia as compared with exam from 2023.  Patient was placed on broad-spectrum antibiotics.  Orthopedic services Dr. Kendal consulted in regards to right tibial fracture and deferred to Dr. Harden due to history of osteomyelitis at the same site.  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 Harden.  Wound VAC applied as per protocol.  Hospital course acute gout flare left great toe uric acid 6.2 placed on colchicine .  Acute blood loss anemia down to 11.7 from preop 13.8-14.7 and monitored.  Mild AKI with BUN 31 as well as hyponatremia 131-132  improved to 135.  Patient's chronic low-dose aspirin  was resumed postoperatively.  Therapy evaluations completed due to patient decreased functional mobility was admitted for a comprehensive rehab program.  Review of Systems  Constitutional:  Negative for chills and fever.  HENT:  Negative for hearing loss.   Eyes:  Negative for blurred vision and double vision.  Respiratory:  Negative for cough, shortness of breath and wheezing.   Cardiovascular:  Positive for leg swelling. Negative for chest pain and palpitations.  Gastrointestinal:  Positive for constipation. Negative for heartburn, nausea and vomiting.  Genitourinary:  Positive for urgency. Negative for dysuria, flank pain and hematuria.  Musculoskeletal:  Positive for falls, joint pain and myalgias.  Skin:  Negative for rash.  All other systems reviewed and are negative.  Past Medical History:  Diagnosis Date   Coronary atherosclerosis of native coronary artery    CABG 2006, EDMUNDS   DJD (degenerative joint disease) `   back and knees   Essential hypertension, benign    History of nuclear stress test    11/08 7 mets, no ischemia, EF 73%   Male hypogonadism    Osteomyelitis (HCC)    R tibia 1972, recur 2005   Other and unspecified hyperlipidemia    Past Surgical History:  Procedure Laterality Date   AMPUTATION Right 11/22/2024   Procedure: RIGHT BELOW KNEE AMPUTATION;  Surgeon: Harden Jerona GAILS, MD;  Location: [facility] OR;  Service: Orthopedics;  Laterality: Right;  Right below the knee amputation   CORONARY ARTERY BYPASS GRAFT  2006   SPINE SURGERY  01/18/2023   (  laminectomy surgery) L3,4,5   Family History  Problem Relation Age of Onset   Heart disease Mother        CABG   Lung cancer Mother    Alcoholism Father    Heart attack Neg Hx    Social History:  reports that he quit smoking about 20 years ago. His smoking use included cigarettes. His smokeless tobacco use includes snuff. He reports current alcohol use of about 8.0  standard drinks of alcohol per week. He reports that he does not use drugs. Allergies: Allergies[1] Medications Prior to Admission  Medication Sig Dispense Refill   ALEVE 220 MG tablet Take 220 mg by mouth daily as needed (for back pain).     amLODipine  (NORVASC ) 10 MG tablet Take 1 tablet (10 mg total) by mouth daily. 90 tablet 3   ARTIFICIAL TEARS 1 % ophthalmic solution Place 1 drop into both eyes 3 (three) times daily as needed (for dryness).     aspirin  81 MG tablet Take 81 mg by mouth at bedtime.     atorvastatin  (LIPITOR ) 80 MG tablet Take 1 tablet (80 mg total) by mouth daily. (Patient taking differently: Take 80 mg by mouth at bedtime.) 90 tablet 3   CLARITIN 10 MG tablet Take 10 mg by mouth daily as needed for allergies or rhinitis.     fluticasone (FLONASE) 50 MCG/ACT nasal spray Place 1 spray into both nostrils 2 (two) times daily as needed for allergies or rhinitis.     lisinopril  (ZESTRIL ) 20 MG tablet Take 1 tablet (20 mg total) by mouth 2 (two) times daily. (Patient taking differently: Take 20 mg by mouth in the morning and at bedtime.) 180 tablet 3   Omega-3 Fatty Acids (FISH OIL) 1000 MG CAPS Take 1,000 mg by mouth 2 (two) times daily after a meal.     pantoprazole  (PROTONIX ) 40 MG tablet Take 1 tablet (40 mg total) by mouth 2 (two) times daily. (Patient taking differently: Take 40 mg by mouth 2 (two) times daily before a meal.) 180 tablet 3      Home: Home Living Family/patient expects to be discharged to:: Private residence Living Arrangements: Spouse/significant other Available Help at Discharge: Family, Available 24 hours/day (Wife 24/7, can be supportive, but unable to physically assist.) Type of Home: Other(Comment) (Condo) Home Access: Stairs to enter [employer] of Steps: 10 (7+landing+3; pt reports could drive to the back, where there is a level entry to the patio and entrance through sliding glass door) Entrance Stairs-Rails: Right Home Layout: Two  level, 1/2 bath on main level, Bed/bath upstairs Alternate Level Stairs-Number of Steps: 14 Bathroom Shower/Tub: [employer]: Handicapped height Bathroom Accessibility: Yes Home Equipment: Rolling Walker (2 wheels), Tub bench Additional Comments: can not get ramp to front entry. Will use back entry   Functional History: Prior Function Prior Level of Function : Independent/Modified Independent, Driving Mobility Comments: Ambulates without AD. 1 fall d/t slipping on ice leading to admission. ADLs Comments: Indep with ADLs/IADLs. Driving.  Functional Status:  Mobility: Bed Mobility Overal bed mobility: Needs Assistance Bed Mobility: Supine to Sit Supine to sit: Supervision, HOB elevated, Used rails Transfers Overall transfer level: Needs assistance Equipment used: Rolling walker (2 wheels) Transfers: Sit to/from Stand, Bed to chair/wheelchair/BSC Sit to Stand: Mod assist, +2 physical assistance, Min assist Bed to/from chair/wheelchair/BSC transfer type:: Step pivot, Stand pivot Stand pivot transfers: Mod assist, +2 physical assistance Step pivot transfers: Mod assist, +2 physical assistance General transfer comment: Pt stood from  lowest bed height. Cued proper hand placement using RW. He pushed up with RUE from bed and LUE on walker. Increased time to achieve upright posture. Transferred to recliner chair on left. Pt initially taking hops, but fatiguing and resorting to pivoting on left foot to reach chair. Ambulation/Gait Ambulation/Gait assistance: Mod assist, +2 physical assistance Gait Distance (Feet): 3 Feet Assistive device: Rolling walker (2 wheels) Gait Pattern/deviations: Step-to pattern General Gait Details: Pt utilized a hopping technique with heavy reliance on BUE support to advance LLE. He maintained RLE NWB at all times. Pt quickly fatigued and was unable to achieve foot clearance, pivoting on sole of foot.    ADL: ADL Overall ADL's : Needs  assistance/impaired Eating/Feeding: Set up, Sitting Grooming: Wash/dry hands, Wash/dry face, Set up, Sitting Upper Body Bathing: Set up, Sitting Lower Body Bathing: Moderate assistance, Sitting/lateral leans Upper Body Dressing : Set up, Sitting Lower Body Dressing: Maximal assistance, Sit to/from stand Toilet Transfer: Moderate assistance, +2 for physical assistance, Rolling walker (2 wheels), Stand-pivot, BSC/3in1 Toileting- Clothing Manipulation and Hygiene: Maximal assistance, Sit to/from stand  Cognition: Cognition Orientation Level: Oriented X4 Cognition Arousal: Alert Behavior During Therapy: WFL for tasks assessed/performed  Physical Exam: Blood pressure 128/61, pulse 60, temperature (!) 97.5 F (36.4 C), resp. rate 17, height 5' 8 (1.727 m), weight 103.4 kg, SpO2 98%. Physical Exam Skin:    Comments: Right BKA with wound VAC dressing in place  Neurological:     Comments: Patient is alert and oriented x 3 as well as cooperative with exam and follows commands.     Results for orders placed or performed during the hospital encounter of 11/18/24 (from the past 48 hours)  Uric acid     Status: None   Collection Time: 11/22/24  2:07 PM  Result Value Ref Range   Uric Acid, Serum 6.2 3.7 - 8.6 mg/dL    Comment: Performed at [facility], [address], [place], [place] [address]  Basic metabolic panel     Status: Abnormal   Collection Time: 11/23/24  3:33 AM  Result Value Ref Range   Sodium 131 (L) 135 - 145 mmol/L   Potassium 4.5 3.5 - 5.1 mmol/L   Chloride 97 (L) 98 - 111 mmol/L   CO2 22 22 - 32 mmol/L   Glucose, Bld 148 (H) 70 - 99 mg/dL    Comment: Glucose reference range applies only to samples taken after fasting for at least 8 hours.   BUN 23 8 - 23 mg/dL   Creatinine, Ser 9.05 0.61 - 1.24 mg/dL   Calcium  9.0 8.9 - 10.3 mg/dL   GFR, Estimated >39 >39 mL/min    Comment: (NOTE) Calculated using the CKD-EPI Creatinine Equation (2021)    Anion gap 12 5 -  15    Comment: Performed at [facility], [address] [place], [place] [address]  Basic metabolic panel     Status: Abnormal   Collection Time: 11/24/24  5:03 AM  Result Value Ref Range   Sodium 135 135 - 145 mmol/L   Potassium 4.4 3.5 - 5.1 mmol/L   Chloride 100 98 - 111 mmol/L   CO2 23 22 - 32 mmol/L   Glucose, Bld 119 (H) 70 - 99 mg/dL    Comment: Glucose reference range applies only to samples taken after fasting for at least 8 hours.   BUN 31 (H) 8 - 23 mg/dL   Creatinine, Ser 9.03 0.61 - 1.24 mg/dL   Calcium  8.9  8.9 - 10.3 mg/dL   GFR, Estimated >39 >39 mL/min    Comment: (NOTE) Calculated using the CKD-EPI Creatinine Equation (2021)    Anion gap 12 5 - 15    Comment: Performed at [facility], [address], [place], [place] [address]  CBC     Status: Abnormal   Collection Time: 11/24/24  5:03 AM  Result Value Ref Range   WBC 11.0 (H) 4.0 - 10.5 K/uL   RBC 3.56 (L) 4.22 - 5.81 MIL/uL   Hemoglobin 11.7 (L) 13.0 - 17.0 g/dL   HCT 65.0 (L) 60.9 - 47.9 %   MCV 98.0 80.0 - 100.0 fL   MCH 32.9 26.0 - 34.0 pg   MCHC 33.5 30.0 - 36.0 g/dL   RDW 87.4 88.4 - 84.4 %   Platelets 286 150 - 400 K/uL   nRBC 0.0 0.0 - 0.2 %    Comment: Performed at [facility], [address] [place], [place] [address]   No results found.    Blood pressure 128/61, pulse 60, temperature (!) 97.5 F (36.4 C), resp. rate 17, height 5' 8 (1.727 m), weight 103.4 kg, SpO2 98%.  Medical Problem List and Plan: 1. Functional deficits secondary to right tibial fracture status post fall/chronic osteomyelitis of tibia.  Status post right BKA 11/22/2024 per Dr. Harden.  Wound VAC is indicated.  Nonweightbearing right lower extremity  -patient may *** shower  -ELOS/Goals: *** 2.  Antithrombotics: -DVT/anticoagulation:  Mechanical: Antiembolism stockings, thigh (TED hose) Left lower extremity  -antiplatelet therapy: Aspirin  81 mg daily 3. Pain Management: Oxycodone  as needed, Voltaren   gel 2 g twice daily as needed 4. Mood/Behavior/Sleep: Melatonin 5 mg nightly as needed, provide emotional support  -antipsychotic agents: N/A 5. Neuropsych/cognition: This patient is capable of making decisions on his own behalf. 6. Skin/Wound Care: Routine skin checks 7. Fluids/Electrolytes/Nutrition/hyponatremia: Routine ins and outs with follow-up chemistries 8.  Acute blood loss anemia.  Ferrous sulfate  3 and 25 mg daily.  Follow-up CBC 9.  Hypertension.  Norvasc  10 mg daily, lisinopril  20 mg daily.  Monitor with increased mobility 10.  Hyperlipidemia.  Lipitor  11.  CAD status post CABG 2006.  Continue low-dose aspirin .  No chest pain or shortness of breath 12.  Acute gout flareup left great toe.  Uric acid 6.2.  Colchicine  0.6 mg daily 13.  Class I obesity.  BMI 34.66.  Lifestyle changes follow-up dietary 14.  GERD.  Protonix  15.  Constipation.  Senokot-S 2 tablets twice daily, MiraLAX  daily as needed Toribio JINNY Pitch, PA-C 11/24/2024     [1]  Allergies Allergen Reactions   Codeine Nausea Only and Other (See Comments)    Made me feel crazy in my head.   "

## 2024-11-24 NOTE — Progress Notes (Signed)
 Occupational Therapy Treatment Patient Details Name: Craig Knapp MRN: [id number] DOB: 09/24/1946 Today's Date: 11/24/2024   History of present illness Pt is a 79 y.o. M adm 11/18/24 for chronic osteomylitis with draining sinus tract and pathologic fracture of the tibia who failed conservative measures and elected for surgical management. Pt s/p R BKA 2/4. PMHx: CAD, CABG, HTN, and DJD.   OT comments  Patient with fair progress toward patient focused goals.  Starting to get used to using RW, better safety noted this session.  Improved hop step to the recliner, still Mod A, but +1.  Patient able to grasp and release with one hand in standing at RW level, which is advancing sit to stand ADL.  Patient remains motivated, and would benefit greatly from AIR to achieve Mod I level prior to returning home.  OT will continue efforts in the acute setting, Patient will benefit from intensive inpatient follow-up therapy, >3 hours/day.      If plan is discharge home, recommend the following:  A lot of help with bathing/dressing/bathroom;Two people to help with walking and/or transfers   Equipment Recommendations  BSC/3in1;Wheelchair (measurements OT);Wheelchair cushion (measurements OT)    Recommendations for Other Services      Precautions / Restrictions Precautions Precautions: Fall Recall of Precautions/Restrictions: Impaired Precaution/Restrictions Comments: RLE Wound Vac Required Braces or Orthoses: Other Brace Other Brace: RLE Limb Guard Restrictions Weight Bearing Restrictions Per Provider Order: Yes RLE Weight Bearing Per Provider Order: Non weight bearing       Mobility Bed Mobility Overal bed mobility: Needs Assistance Bed Mobility: Supine to Sit     Supine to sit: Supervision, HOB elevated, Used rails          Transfers Overall transfer level: Needs assistance Equipment used: Rolling walker (2 wheels) Transfers: Sit to/from Stand, Bed to chair/wheelchair/BSC Sit to  Stand: Mod assist, From elevated surface     Step pivot transfers: Mod assist           Balance Overall balance assessment: Needs assistance Sitting-balance support: Feet supported, Bilateral upper extremity supported Sitting balance-Leahy Scale: Good     Standing balance support: Reliant on assistive device for balance Standing balance-Leahy Scale: Poor                             ADL either performed or assessed with clinical judgement   ADL   Eating/Feeding: Set up;Sitting   Grooming: Wash/dry hands;Wash/dry face;Set up;Sitting               Lower Body Dressing: Maximal assistance;Sit to/from stand   Toilet Transfer: Minimal assistance;Stand-pivot;BSC/3in1;Rolling walker (2 wheels)   Toileting- Clothing Manipulation and Hygiene: Minimal assistance;Sit to/from stand;Sitting/lateral lean              Extremity/Trunk Assessment Upper Extremity Assessment Upper Extremity Assessment: Overall WFL for tasks assessed   Lower Extremity Assessment Lower Extremity Assessment: Defer to PT evaluation   Cervical / Trunk Assessment Cervical / Trunk Assessment: Other exceptions Cervical / Trunk Exceptions: R BKA    Vision Patient Visual Report: No change from baseline     Perception Perception Perception: Not tested   Praxis Praxis Praxis: Not tested   Communication Communication Communication: No apparent difficulties   Cognition Arousal: Alert Behavior During Therapy: WFL for tasks assessed/performed Cognition: No apparent impairments  Following commands: Intact        Cueing   Cueing Techniques: Verbal cues  Exercises      Shoulder Instructions       General Comments      Pertinent Vitals/ Pain       Pain Assessment Pain Assessment: No/denies pain Pain Intervention(s): Monitored during session                                                           Frequency  Min 2X/week        Progress Toward Goals  OT Goals(current goals can now be found in the care plan section)  Progress towards OT goals: Progressing toward goals  Acute Rehab OT Goals OT Goal Formulation: With patient Time For Goal Achievement: 12/07/24 Potential to Achieve Goals: Good  Plan      Co-evaluation                 AM-PAC OT 6 Clicks Daily Activity     Outcome Measure   Help from another person eating meals?: None Help from another person taking care of personal grooming?: None Help from another person toileting, which includes using toliet, bedpan, or urinal?: A Lot Help from another person bathing (including washing, rinsing, drying)?: A Lot Help from another person to put on and taking off regular upper body clothing?: None Help from another person to put on and taking off regular lower body clothing?: A Lot 6 Click Score: 18    End of Session Equipment Utilized During Treatment: Gait belt;Rolling walker (2 wheels)  OT Visit Diagnosis: Unsteadiness on feet (R26.81);Muscle weakness (generalized) (M62.81);Pain Pain - Right/Left: Right Pain - part of body: Leg   Activity Tolerance Patient tolerated treatment well   Patient Left in chair;with call bell/phone within reach;with chair alarm set   Nurse Communication Mobility status        Time: 9149-9094 OT Time Calculation (min): 15 min  Charges: OT General Charges $OT Visit: 1 Visit OT Treatments $Self Care/Home Management : 8-22 mins  11/24/2024  RP, OTR/L  Acute Rehabilitation Services  Office:  [phone]   DELAINE CANTER 11/24/2024, 9:42 AM

## 2024-11-24 NOTE — Progress Notes (Signed)
 " PROGRESS NOTE Craig Knapp  FMW:[id number] DOB: 21-Nov-1945 DOA: 11/18/2024 PCP: Purcell Emil Schanz, MD  Brief Narrative/Hospital Course: Craig Knapp is a 79 y.o. male with PMH of HTN, HLD, CAD status post CABG, chronic right tibia osteomyelitis who presented to ED after a fall on ice and was unable to walk with severe pain. In ZI:qnlwi to have a comminuted and displaced tibial fracture at the old fracture site. Of note, he has been having chronic osteomyelitis but has been off antibiotics for the past 4 to 5 years. Patient was admitted for further management-evaluated by Dr. Kendal and subsequently Dr. Harden and underwent right BKA 2/4  Subjective: Seen and examined Resting comfortably in the bedside chair. Pain is controlled. Overnight hemodynamically stable afebrile on room air.  Labs reviewed stable lites mild anemia and leukocytosis He worked with PT OT yesterday and recommending inpatient rehab  Assessment and plan:  Right tibial fracture s/p fall Chronic osteomyelitis of tibia: S/p right BKA.Continue postop plan as per orthopedic with-Multimodal pain management,wound care, wound VAC Continue PT OT -recommending inpatient rehab he lives at home with family with good support  He will have bed for rehab tomorrow  Acute left great toe gout: Colchicine  started here.  Essential hypertension: Well-controlled,.  Continue home amlodipine  10, lisinopril  20.    CAD-history of CABG HLD: no chest pain.  Continue just aspirin  81, Lipitor  80  Mild anemia: Continue iron supplementation  Leukocytosis: Elevated on admission likely reactive in the setting of fall and fracture, resolving.  Hyponatremia: Mild encourage oral intake  Class I Obesity w/ Body mass index is 34.66 kg/m.: Will benefit with PCP follow-up, weight loss,healthy lifestyle  Deconditioning/debility Mobility: PT Orders: Active PT Follow up Rec: Acute Inpatient Rehab (3hours/Day)11/23/2024 1200   DVT  prophylaxis: SCDs Start: 11/22/24 1414 SCD's Start: 11/22/24 1414 Code Status:   Code Status: Full Code Family Communication: plan of care discussed with patient at bedside. Patient status is: Remains hospitalized because of severity of illness Level of care: Med-Surg   Dispo: The patient is from: Home with wife and independent PTA            Anticipated disposition: CIR tomorrow.  Remains medically stable Objective: Vitals last 24 hrs: Vitals:   11/23/24 2050 11/23/24 2345 11/24/24 0505 11/24/24 0722  BP: (!) 118/55 112/62 128/61 (!) 112/92  Pulse: 64 61 60 84  Resp: 18 18 17 17   Temp: 98 F (36.7 C) 97.7 F (36.5 C) (!) 97.5 F (36.4 C) 98.3 F (36.8 C)  TempSrc:    Oral  SpO2: 97% 97% 98% 97%  Weight:      Height:       Physical Examination: General exam: alert awake HEENT:Oral mucosa moist, Ear/Nose WNL grossly Respiratory system: Bilaterally clear BS,no use of accessory muscle Cardiovascular system: S1 & S2 +, No JVD. Gastrointestinal system: Abdomen soft,NT,ND, BS+ Nervous System: Alert, awake, moving all extremities,and following commands. Extremities: extremities warm, lRT BKA dressing in place Skin: Warm, no rashes MSK: Normal muscle bulk,tone, power   Medications reviewed:  Scheduled Meds:  acidophilus  1 capsule Oral Daily   amLODipine   10 mg Oral Daily   vitamin C   1,000 mg Oral Daily   aspirin  EC  81 mg Oral QHS   atorvastatin   80 mg Oral QHS   colchicine   0.6 mg Oral Daily   docusate sodium   100 mg Oral Daily   ferrous sulfate   325 mg Oral Daily   lisinopril   20 mg Oral Daily  nutrition supplement (JUVEN)  1 packet Oral BID BM   pantoprazole   40 mg Oral BID AC   senna-docusate  2 tablet Oral BID   zinc  sulfate (50mg  elemental zinc )  220 mg Oral Daily  Continuous Infusions:  Diet: Diet Order             Diet Heart Room service appropriate? Yes; Fluid consistency: Thin  Diet effective now                     Unresulted Labs (From  admission, onward)     Start     Ordered   11/23/24 0500  Basic metabolic panel  Daily,   R      11/22/24 1414   Signed and Held  Comprehensive metabolic panel  Once,   R        Signed and Held   Signed and Held  CBC with Differential/Platelet  Once,   R        Signed and Held           Data Reviewed: I have personally reviewed following labs and imaging studies ( see epic result tab) CBC: Recent Labs  Lab 11/18/24 1511 11/19/24 0400 11/21/24 0456 11/24/24 0503  WBC 15.1* 17.4* 11.6* 11.0*  NEUTROABS  --   --  9.8*  --   HGB 14.7 13.8 12.4* 11.7*  HCT 43.4 41.9 35.3* 34.9*  MCV 97.1 96.8 95.1 98.0  PLT 244 251 177 286   CMP: Recent Labs  Lab 11/18/24 1511 11/19/24 0400 11/21/24 0456 11/23/24 0333 11/24/24 0503  NA 135 132* 132* 131* 135  K 4.8 4.9 4.1 4.5 4.4  CL 101 97* 97* 97* 100  CO2 24 22 23 22 23   GLUCOSE 122* 106* 92 148* 119*  BUN 13 14 13 23  31*  CREATININE 1.12 1.31* 0.89 0.94 0.96  CALCIUM  9.2 9.1 8.7* 9.0 8.9  MG  --  1.7  --   --   --   PHOS  --  4.6  --   --   --    GFR: Estimated Creatinine Clearance: 75.1 mL/min (by C-G formula based on SCr of 0.96 mg/dL). Recent Labs  Lab 11/18/24 1511 11/19/24 0400  AST 36 41  ALT 30 27  ALKPHOS 64 62  BILITOT 0.7 1.6*  PROT 7.0 7.4  ALBUMIN 4.2 4.1   No results for input(s): LIPASE, AMYLASE in the last 168 hours. No results for input(s): AMMONIA in the last 168 hours. Coagulation Profile: No results for input(s): INR, PROTIME in the last 168 hours. Antimicrobials/Microbiology: Anti-infectives (From admission, onward)    Start     Dose/Rate Route Frequency Ordered Stop   11/22/24 1252  vancomycin  (VANCOCIN ) powder  Status:  Discontinued          As needed 11/22/24 1252 11/22/24 1315   11/22/24 1215  vancomycin  (VANCOCIN ) powder  Status:  Discontinued          As needed 11/22/24 1215 11/22/24 1315   11/22/24 0800  ceFAZolin  (ANCEF ) IVPB 2g/100 mL premix  Status:  Discontinued        2  g 200 mL/hr over 30 Minutes Intravenous On call to O.R. 11/22/24 0711 11/22/24 1356   11/19/24 1700  vancomycin  (VANCOREADY) IVPB 1500 mg/300 mL        1,500 mg 150 mL/hr over 120 Minutes Intravenous Every 24 hours 11/19/24 0936 11/23/24 1940   11/18/24 1700  vancomycin  (VANCOREADY) IVPB 1750 mg/350 mL  Status:  Discontinued        1,750 mg 175 mL/hr over 120 Minutes Intravenous Every 24 hours 11/18/24 1619 11/19/24 0936         Component Value Date/Time   SDES ABSCESS 01/18/2009 1220   SDES ABSCESS 01/18/2009 1220   SDES ABSCESS 01/18/2009 1220   SPECREQUEST RIGHT TIBIA 01/18/2009 1220   SPECREQUEST RIGHT TIBIA 01/18/2009 1220   SPECREQUEST RIGHT TIBIA 01/18/2009 1220   CULT NO GROWTH 3 DAYS 01/18/2009 1220   CULT NO ACID FAST BACILLI ISOLATED IN 6 WEEKS 01/18/2009 1220   CULT NO ANAEROBES ISOLATED 01/18/2009 1220   REPTSTATUS 01/21/2009 FINAL 01/18/2009 1220   REPTSTATUS 03/04/2009 FINAL 01/18/2009 1220   REPTSTATUS 01/23/2009 FINAL 01/18/2009 1220    Procedures: Procedures (LRB): RIGHT BELOW KNEE AMPUTATION (Right)   Mennie LAMY, MD [facility] 11/24/2024, 11:16 AM   "

## 2024-11-24 NOTE — Progress Notes (Addendum)
" ° °  Inpatient Rehabilitation Admissions Coordinator   I have CIR bed to admit him to Saturday. 5C nurse can call rehab Saturday to give report and to clarify when bed will be available to admit to once discharges complete. Dr Lovorn will be admitting Rehab MD. I will make the arrangements .[facility] Acute team and TOC made aware.  Heron Leavell, RN, MSN Rehab Admissions Coordinator [phone] 11/24/2024 10:02 AM  "

## 2024-11-25 ENCOUNTER — Inpatient Hospital Stay (HOSPITAL_COMMUNITY): Admission: AD | Admit: 2024-11-25 | Source: Intra-hospital | Admitting: Physical Medicine and Rehabilitation

## 2024-12-05 ENCOUNTER — Ambulatory Visit: Admitting: Gastroenterology

## 2025-07-06 ENCOUNTER — Ambulatory Visit
